# Patient Record
Sex: Female | Born: 1985 | Race: White | Hispanic: No | Marital: Married | Smoking: Former smoker
Health system: Southern US, Community
[De-identification: ages and names within clinical notes are randomized; demographics above are authoritative.]

## PROBLEM LIST (undated history)

## (undated) DIAGNOSIS — F32A Depression, unspecified: Secondary | ICD-10-CM

## (undated) DIAGNOSIS — F419 Anxiety disorder, unspecified: Secondary | ICD-10-CM

## (undated) DIAGNOSIS — U071 COVID-19: Secondary | ICD-10-CM

## (undated) DIAGNOSIS — T7840XA Allergy, unspecified, initial encounter: Secondary | ICD-10-CM

## (undated) DIAGNOSIS — Z8719 Personal history of other diseases of the digestive system: Secondary | ICD-10-CM

## (undated) DIAGNOSIS — K219 Gastro-esophageal reflux disease without esophagitis: Secondary | ICD-10-CM

## (undated) DIAGNOSIS — D509 Iron deficiency anemia, unspecified: Secondary | ICD-10-CM

## (undated) DIAGNOSIS — F329 Major depressive disorder, single episode, unspecified: Secondary | ICD-10-CM

## (undated) HISTORY — PX: GASTRIC BYPASS: SHX52

## (undated) HISTORY — DX: Major depressive disorder, single episode, unspecified: F32.9

## (undated) HISTORY — DX: Depression, unspecified: F32.A

## (undated) HISTORY — DX: Anxiety disorder, unspecified: F41.9

## (undated) HISTORY — DX: COVID-19: U07.1

---
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
## 2009-11-13 ENCOUNTER — Inpatient Hospital Stay: Payer: Self-pay | Admitting: Surgery

## 2009-11-17 LAB — PATHOLOGY REPORT

## 2010-11-16 HISTORY — PX: CHOLECYSTECTOMY: SHX55

## 2012-02-01 DIAGNOSIS — N978 Female infertility of other origin: Secondary | ICD-10-CM | POA: Insufficient documentation

## 2012-02-01 DIAGNOSIS — Z3181 Encounter for male factor infertility in female patient: Secondary | ICD-10-CM | POA: Insufficient documentation

## 2013-10-28 ENCOUNTER — Ambulatory Visit: Payer: Self-pay | Admitting: Specialist

## 2013-11-06 ENCOUNTER — Ambulatory Visit: Payer: Self-pay | Admitting: Specialist

## 2014-01-02 ENCOUNTER — Ambulatory Visit: Payer: Self-pay | Admitting: Specialist

## 2014-01-14 ENCOUNTER — Inpatient Hospital Stay: Payer: Self-pay | Admitting: Specialist

## 2014-01-14 LAB — CK-MB
CK-MB: 1.7 ng/mL (ref 0.5–3.6)
CK-MB: 1.7 ng/mL (ref 0.5–3.6)

## 2014-01-14 LAB — TROPONIN I: Troponin-I: <0.02 ng/mL

## 2014-01-15 LAB — BASIC METABOLIC PANEL
ANION GAP: 8 (ref 7–16)
BUN: 4 mg/dL — ABNORMAL LOW (ref 7–18)
CHLORIDE: 111 mmol/L — AB (ref 98–107)
CO2: 22 mmol/L (ref 21–32)
Calcium, Total: 7.9 mg/dL — ABNORMAL LOW (ref 8.5–10.1)
Creatinine: 0.64 mg/dL (ref 0.60–1.30)
EGFR (African American): >60
EGFR (Non-African Amer.): >60
GLUCOSE: 77 mg/dL (ref 65–99)
Osmolality: 277 (ref 275–301)
Potassium: 4.4 mmol/L (ref 3.5–5.1)
Sodium: 141 mmol/L (ref 136–145)

## 2014-01-15 LAB — CBC WITH DIFFERENTIAL/PLATELET
Basophil #: 0 10*3/uL (ref 0.0–0.1)
Basophil %: 0.3 %
EOS ABS: 0 10*3/uL (ref 0.0–0.7)
Eosinophil %: 0.1 %
HCT: 39.7 % (ref 35.0–47.0)
HGB: 13.4 g/dL (ref 12.0–16.0)
LYMPHS PCT: 15.6 %
Lymphocyte #: 1.6 10*3/uL (ref 1.0–3.6)
MCH: 28.5 pg (ref 26.0–34.0)
MCHC: 33.7 g/dL (ref 32.0–36.0)
MCV: 85 fL (ref 80–100)
Monocyte #: 0.8 x10 3/mm (ref 0.2–0.9)
Monocyte %: 7.6 %
Neutrophil #: 7.9 10*3/uL — ABNORMAL HIGH (ref 1.4–6.5)
Neutrophil %: 76.4 %
PLATELETS: 254 10*3/uL (ref 150–440)
RBC: 4.7 10*6/uL (ref 3.80–5.20)
RDW: 14 % (ref 11.5–14.5)
WBC: 10.3 10*3/uL (ref 3.6–11.0)

## 2014-01-15 LAB — MAGNESIUM: MAGNESIUM: 1.7 mg/dL — AB

## 2014-01-15 LAB — ALBUMIN: Albumin: 3.1 g/dL — ABNORMAL LOW (ref 3.4–5.0)

## 2014-01-15 LAB — PHOSPHORUS: PHOSPHORUS: 2.9 mg/dL (ref 2.5–4.9)

## 2014-08-09 NOTE — Consult Note (Signed)
Brief Consult Note: Diagnosis: 29 yr old female with chest pain , s/p gastric bypass.   Recommend further assessment or treatment.   Orders entered.   Comments: 29 yr old female  s/p gastric bypass surgery developed chest pain  relieved with belching. 1.Atypical chest pain;likley related to dyspepsia;check troponin,ekg to r/o ACS EKG  reviwed and no  ST-T changes.wil folow troponin,make further rec. troponins are negative,no further work  up is needed.  Electronic Signatures: Katha HammingKonidena, Lesha Jager (MD)  (Signed 29-Sep-15 14:55)  Authored: Brief Consult Note   Last Updated: 29-Sep-15 14:55 by Katha HammingKonidena, Robley Matassa (MD)

## 2014-08-09 NOTE — Op Note (Signed)
PATIENT NAME:  Mary Fox, Mary Fox MR#:  [id number] DATE OF BIRTH:  Aug 21, 1985  DATE OF PROCEDURE:  01/14/2014  PREOPERATIVE DIAGNOSIS: Morbid obesity.   POSTOPERATIVE DIAGNOSIS: Morbid obesity.   PROCEDURE: Laparoscopic Roux-en-Y gastric bypass.   SURGEON:  Primus Bravo, MD  ASSISTANT:  Mariella Saa, PA-C.  COMPLICATIONS:  None.   ANESTHESIA:  General endotracheal.   CLINICAL HISTORY:  See history and physical examination.  DETAILS OF THE PROCEDURE:  The patient was taken to the operating room and placed on the operating table in the supine position.  Appropriate monitors and supplemental oxygen were delivered.  Broad-spectrum IV antibiotics were administered.  Sequential stockings were placed.  The abdomen was prepped and draped in the usual sterile fashion after the smooth induction of general anesthesia.  The abdomen was accessed using a 5-ml optical trocar in the left upper quadrant.  Pneumoperitoneum was established without difficulty.  Multiple other ports were placed in preparation for gastric bypass.  The small bowel was identified at the ligament of Treitz and run for 50 cm distal to that point.  It was then divided using an Echelon white load stapler reinforced with seam guard.  The Harmonic scalpel was used to take down the mesentery to its base.  A (Dictation Anomaly)125 cm Roux limb was then created and a side-to-side jejunojejunostomy was created in the following fashion.  A tacking stitch was used using 2-0 Surgidac suture.  An enterotomy was made in both limbs and the Echelon white load stapler was inserted to its hub, clamped, and fired for its full 6-cm length.  The ensuing defect was closed using interrupted sutures to retract the enterotomy up and stapled across with the Echelon blue-load stapler with good effect.  A baby's butt stitch was placed times one using 2-0 Surgidac suture.  A stay suture was placed anteriorly using a 2-0 Surgidac suture and the mesenteric defect was closed using  running 2-0 Surgidac suture.  The omentum was divided then in the midline to create a path for the Roux limb to be lifted and elevated up to the gastric pouch.  The liver retractor was placed and the liver was retracted anteriorly and cephalad.  The patient was placed in steep reverse Trendelenburg after the Roux limb was tacked to the underside of the stomach using a 2-0 Surgidac suture.  All structures were removed from the stomach.  The pars flaccida was taken down using a Harmonic scalpel to the second gastric vein.  At that point the Echelon gold load reinforced with seam guard was used to fire multiple times creating a small 30 to 45-ml pouch.  Hemostasis was excellent.  The posterior pouch was then freed of all fibrofatty tissue in preparation for anastomosis.  The small bowel Roux limb was then mobilized up into the upper abdomen and tacked to the left side of the pouch with an interrupted 2-0 Surgidac suture.  Enterotomies were made in the gastric pouch and in the small bowel and the Echelon blue load stapler was inserted to 28 mm and clamped.  This was then fired and the ensuing defect was closed using a layer of 2-0 Polysorb suture times one.  A 34 French blunt-tipped bougie was inserted trans-orally down past the anastomosis and the second layer of closure occurred with two running 2-0 Polysorb sutures from either side, creating a two-layer anastomosis.  The bougie was removed and the proximal Roux limb was clamped.  This was submerged underneath saline and endoscope was placed trans-orally and guided  all the way down to the anastomosis without difficulty.  The anastomosis was widely patent and hemostasis was excellent.  No leak was present under high-pressure insufflation with the proximal Roux limb clamp.  The endoscopes were removed and I re-gowned and re-gloved.  We took the omentum and draped that over the top of the anastomosis and sutured it in place using a 2-0 Surgidac suture.  The Peterson's  defect was then closed using running 2-0 Surgidac suture. A drain was placed in the left upper quadrant over the anastomosis of 19 [place] Blake.  The liver retractor and trocars were removed without incident.  No bleeding occurred from any of these sites.  The wounds were then closed using 4-0 Vicryl and Dermabond.  The patient tolerated the procedure well, arrived in the recovery room in stable condition, and will be admitted to my care.   ____________________________ Primus BravoJon Tangi Shroff, MD jb:lt D: 01/14/2014 09:24:41 ET T: 01/14/2014 09:57:33 ET JOB#: [id number]  cc: Primus BravoJon Glendi Mohiuddin, MD, <Dictator> Geoffry ParadiseJON M Marvene Strohm MD ELECTRONICALLY SIGNED 01/14/2014 14:19

## 2014-08-09 NOTE — Consult Note (Signed)
PATIENT NAME:  Mary Fox, Claudett MR#:  [id number] DATE OF BIRTH:  1985-09-03  DATE OF CONSULTATION:  01/14/2014  CONSULT REQUESTING PHYSICIAN: Primus BravoJon Bruce, MD, bariatric surgery. CONSULTING PHYSICIAN:  Katha HammingSnehalatha Marwin Primmer, MD  REASON FOR CONSULTATION:  Chest pain. This is an urgent consult for chest pain evaluation.  HISTORY OF PRESENT ILLNESS:  The patient is a 29 year old morbidly obese female, had bariatric surgery today and patient had a gastric bypass surgery and the patient developed chest pain in the PACU and continued to have chest pain when she came into the room. The patient said it was midsternal chest pain and was not radiating to the shoulder or neck, but it was going to the back. No associated nausea or vomiting. The patient says that belching helped her and she felt better after belching.  Dr. Smitty CordsBruce called us to evaluate for chest pain evaluation. The patient has no past medical history of hypertension or diabetes.   PATIENT'S HOME MEDICATIONS:  Duloxetine 60 mg p.o. daily, clonidine 0.5 mg p.o. daily, magnesium citrate 100 mg 1 tablet daily, multivitamin 1 tablet daily, vitamin D3 one tablet daily, biotin 1 tablet daily.   SOCIAL HISTORY: No smoking, no drinking, no drugs.   PAST SURGICAL HISTORY: The patient had no history of operation except for gallbladder operation.   FAMILY HISTORY: Mother has history of atrial fibrillation and ablations before.   CURRENT MEDICATIONS:  She is on Dilaudid PCA and also Phenergan and Lovenox.   REVIEW OF SYSTEMS:   CONSTITUTIONAL: The patient denies any fever or fatigue.  EYES: No blurred vision.  ENT: No tinnitus. No epistaxis. No difficulty swallowing.  RESPIRATORY: No cough. No wheezing.  CARDIOVASCULAR: She has midsternal chest pain that radiates into the back. No orthopnea, or PND, no palpitations.   GASTROINTESTINAL: Has a lot of belching, denies any nausea or vomiting.  GENITOURINARY: No dysuria.  ENDOCRINE: No polyuria or nocturia.   HEMATOLOGIC: No anemia.  INTEGUMENTARY: No skin rash.  MUSCULOSKELETAL: No joint pain.  NEUROLOGIC: No numbness or weakness.  PSYCHIATRIC: Does have history of depression.   PHYSICAL EXAMINATION: VITAL SIGNS: Temperature 99.9, heart rate 80, blood pressure 126/86, saturation 99% on room air.  GENERAL: She is alert, awake, oriented, well-developed, well-nourished female seen in the room. She just had surgery and she is on Dilaudid PCA.  HEAD: Normocephalic, atraumatic.  EYES: Pupils equal, reacting to light. No conjunctival pallor, no icterus.  NOSE: No abnormalities. No drainage. MOUTH: No lesions. No exudates.  NECK: Supple. No JVD, no carotid bruit.  RESPIRATORY: Good respiratory effort. Clear to auscultation. No wheeze.  CARDIOVASCULAR: The patient has no CVA tenderness. Regular rate and rhythm. No gallops. The patient has no peripheral edema. Femoral pulses and pedal pulses are intact.  GASTROINTESTINAL: Abdomen is soft. The patient had gastric bypass surgery today.  MUSCULOSKELETAL: Normal gait and station. The patient able to move extremities x 4.  SKIN: Well hydrated.  NEUROLOGIC: Cranial nerves II through XII intact. Power 5/5 upper and lower extremities. DTR 2+ bilaterally.  PSYCHIATRIC: Mood and affect are within normal limits.   The patient's EKG is reviewed.  The patient did not have any ST-T changes.  It is normal sinus rhythm, 80 beats per minute.  LABORATORY DATA:  The patient's troponins have been negative at less than 0.02, CPK-MB is 1.7.   ASSESSMENT AND PLAN: The patient is a 29 year old female with atypical chest pain, likely secondary to dyspepsia rather than any coronary event. She really got better after belching so it  might be consistent with dyspepsia and gastroesophageal reflux disease rather than acute coronary syndrome.  No further workup is necessary. We will continue famotidine and can get Mylanta from tomorrow. The patient's condition discussed with mother  and at this time we will not order further workup for her chest pain.   TIME SPENT: About 55 minutes.   Thanks for asking [place] to see this patient.    ____________________________ Katha Hamming, MD sk:LT D: 01/14/2014 14:53:33 ET T: 01/14/2014 17:45:26 ET JOB#: [id number]  cc: Katha Hamming, MD, <Dictator> Katha Hamming MD ELECTRONICALLY SIGNED 02/08/2014 18:34

## 2014-10-24 ENCOUNTER — Telehealth: Payer: Self-pay | Admitting: Family Medicine

## 2014-10-24 NOTE — Telephone Encounter (Signed)
I don't see where this has ever been prescribed to her in the past from this office. For now I will not refill.  Dr. Juanetta GoslingHawkins can review it on Monday.

## 2014-10-24 NOTE — Telephone Encounter (Signed)
[name] called wondering if a refill for a medication patch can be sent to her pharmacy. She said [name] usually prescribes the patch for her before she goes out on the boat. She said she's not been here in a few months. She's wondering if she can get this refill today as she's going on the boat this weekend. Please call the pt. Pt ph# [phone] Thank you.

## 2014-10-24 NOTE — Telephone Encounter (Signed)
Please review this. I am not able to decide if this can be sent.

## 2014-10-27 NOTE — Telephone Encounter (Signed)
LMTCB I do not think she is having any issues and have not heard from her.[facility]

## 2014-10-27 NOTE — Telephone Encounter (Signed)
Let me know if she calls this AM. Sshe may not need it since she was supposed to be on boat this past weekend.-jh

## 2014-11-18 ENCOUNTER — Encounter: Payer: Self-pay | Admitting: Emergency Medicine

## 2014-11-18 ENCOUNTER — Inpatient Hospital Stay
Admission: EM | Admit: 2014-11-18 | Discharge: 2014-11-19 | DRG: 641 | Disposition: A | Discharge status: Home / Self Care | Payer: 59 | Attending: Internal Medicine | Admitting: Internal Medicine

## 2014-11-18 DIAGNOSIS — R1010 Upper abdominal pain, unspecified: Secondary | ICD-10-CM

## 2014-11-18 DIAGNOSIS — Z79899 Other long term (current) drug therapy: Secondary | ICD-10-CM

## 2014-11-18 DIAGNOSIS — E871 Hypo-osmolality and hyponatremia: Secondary | ICD-10-CM | POA: Diagnosis not present

## 2014-11-18 DIAGNOSIS — Z9884 Bariatric surgery status: Secondary | ICD-10-CM

## 2014-11-18 DIAGNOSIS — R1012 Left upper quadrant pain: Secondary | ICD-10-CM | POA: Diagnosis present

## 2014-11-18 DIAGNOSIS — R1013 Epigastric pain: Secondary | ICD-10-CM | POA: Diagnosis present

## 2014-11-18 DIAGNOSIS — Z9049 Acquired absence of other specified parts of digestive tract: Secondary | ICD-10-CM | POA: Diagnosis present

## 2014-11-18 LAB — COMPREHENSIVE METABOLIC PANEL
ALBUMIN: 2.8 g/dL — AB (ref 3.5–5.0)
ALT: 153 U/L — AB (ref 14–54)
AST: 101 U/L — ABNORMAL HIGH (ref 15–41)
Alkaline Phosphatase: 99 U/L (ref 38–126)
Anion gap: 11 (ref 5–15)
BUN: 17 mg/dL (ref 6–20)
CO2: 24 mmol/L (ref 22–32)
Calcium: 7.7 mg/dL — ABNORMAL LOW (ref 8.9–10.3)
Chloride: 88 mmol/L — ABNORMAL LOW (ref 101–111)
Creatinine, Ser: 0.88 mg/dL (ref 0.44–1.00)
GFR calc Af Amer: >60 mL/min (ref >60)
GFR calc non Af Amer: >60 mL/min (ref >60)
Glucose, Bld: 117 mg/dL — ABNORMAL HIGH (ref 65–99)
Potassium: 3.7 mmol/L (ref 3.5–5.1)
Sodium: 123 mmol/L — ABNORMAL LOW (ref 135–145)
Total Bilirubin: 1.9 mg/dL — ABNORMAL HIGH (ref 0.3–1.2)
Total Protein: 5.9 g/dL — ABNORMAL LOW (ref 6.5–8.1)

## 2014-11-18 LAB — URINALYSIS COMPLETE WITH MICROSCOPIC (ARMC ONLY)
Bacteria, UA: NONE SEEN
Bilirubin Urine: NEGATIVE
GLUCOSE, UA: NEGATIVE mg/dL
HGB URINE DIPSTICK: NEGATIVE
Ketones, ur: NEGATIVE mg/dL
Leukocytes, UA: NEGATIVE
Nitrite: NEGATIVE
PH: 6 (ref 5.0–8.0)
PROTEIN: NEGATIVE mg/dL
SPECIFIC GRAVITY, URINE: 1.002 — AB (ref 1.005–1.030)

## 2014-11-18 LAB — CBC WITH DIFFERENTIAL/PLATELET
BASOS PCT: 0 %
Basophils Absolute: 0 10*3/uL (ref 0–0.1)
EOS PCT: 1 %
Eosinophils Absolute: 0.1 10*3/uL (ref 0–0.7)
HCT: 39.5 % (ref 35.0–47.0)
Hemoglobin: 13.2 g/dL (ref 12.0–16.0)
Lymphocytes Relative: 36 %
Lymphs Abs: 3 10*3/uL (ref 1.0–3.6)
MCH: 28.2 pg (ref 26.0–34.0)
MCHC: 33.5 g/dL (ref 32.0–36.0)
MCV: 84 fL (ref 80.0–100.0)
MONO ABS: 0.5 10*3/uL (ref 0.2–0.9)
Monocytes Relative: 6 %
Neutro Abs: 4.7 10*3/uL (ref 1.4–6.5)
Neutrophils Relative %: 57 %
PLATELETS: 227 10*3/uL (ref 150–440)
RBC: 4.7 MIL/uL (ref 3.80–5.20)
RDW: 12.9 % (ref 11.5–14.5)
WBC: 8.3 10*3/uL (ref 3.6–11.0)

## 2014-11-18 LAB — LIPASE, BLOOD: LIPASE: 13 U/L — AB (ref 22–51)

## 2014-11-18 LAB — POCT PREGNANCY, URINE: Preg Test, Ur: NEGATIVE

## 2014-11-18 MED ORDER — SODIUM CHLORIDE 0.9 % IV BOLUS (SEPSIS)
500.0000 mL | Freq: Once | INTRAVENOUS | Status: AC
  Administered 2014-11-18: 500 mL via INTRAVENOUS

## 2014-11-18 MED ORDER — IOHEXOL 240 MG/ML SOLN
25.0000 mL | Freq: Once | INTRAMUSCULAR | Status: AC | PRN
  Administered 2014-11-18: 25 mL via ORAL

## 2014-11-18 NOTE — ED Notes (Signed)
Pt arrived to the ED for complaints of abdominal pain in the LUQ radiating to the RUQ. Pt states that this pain comes on often and it has happened for the las couple of weeks. Pt reports that the pain starts after eating and denies any other accompanied symptoms. Pt is concern because she had a gastric bypass surgery. Pt is AOx4 in no apparent distress.

## 2014-11-18 NOTE — ED Provider Notes (Signed)
[facility] Emergency Department Provider Note  ____________________________________________  Time seen: Approximately 10:15 PM  I have reviewed the triage vital signs and the nursing notes.   HISTORY  Chief Complaint Abdominal Pain    HPI Mary Fox is a 29 y.o. female with a history of gastric bypass in September 2015 who presents today with left upper quadrant and right upper quadrant abdominal pain which has been intermittent today. The pain is sharp and worsened by food. At this point the patient is only having mild pain and is not requesting any pain medications. She is not had any nausea, vomiting or diarrhea. She is also had her gallbladder removed 4 years ago. Denies any burning with urination or frequency.she says that she has had these episodes of pain over the past year, however they have been worsening over the past month.   History reviewed. No pertinent past medical history.  There are no active problems to display for this patient.   Past Surgical History  Procedure Laterality Date  . Gastric bypass    . Cholecystectomy      Current Outpatient Rx  Name  Route  Sig  Dispense  Refill  . DULoxetine (CYMBALTA) 60 MG capsule   Oral   Take 60 mg by mouth daily.         . Multiple Vitamin (MULTIVITAMIN) capsule   Oral   Take 1 capsule by mouth daily.           Allergies Review of patient's allergies indicates no known allergies.  History reviewed. No pertinent family history.  Social History History  Substance Use Topics  . Smoking status: Never Smoker   . Smokeless tobacco: Not on file  . Alcohol Use: Yes    Review of Systems Constitutional: No fever/chills Eyes: No visual changes. ENT: No sore throat. Cardiovascular: Denies chest pain. Respiratory: Denies shortness of breath. Gastrointestinal:  No nausea, no vomiting.  No diarrhea.  No constipation. Genitourinary: Negative for dysuria. Musculoskeletal: Negative for  back pain. Skin: Negative for rash. Neurological: Negative for headaches, focal weakness or numbness.  10-point ROS otherwise negative.  ____________________________________________   PHYSICAL EXAM:  VITAL SIGNS: ED Triage Vitals  Enc Vitals Group     BP 11/18/14 1931 129/76 mmHg     Pulse Rate 11/18/14 1931 58     Resp 11/18/14 1931 16     Temp 11/18/14 1931 98.3 F (36.8 C)     Temp Source 11/18/14 1931 Oral     SpO2 11/18/14 1931 100 %     Weight 11/18/14 1931 201 lb (91.173 kg)     Height 11/18/14 1931  (1.727 m)     Head Cir --      Peak Flow --      Pain Score 11/18/14 1932 5     Pain Loc --      Pain Edu? --      Excl. in GC? --     Constitutional: Alert and oriented. Well appearing and in no acute distress. Eyes: Conjunctivae are normal. PERRL. EOMI. Head: Atraumatic. Nose: No congestion/rhinnorhea. Mouth/Throat: Mucous membranes are moist.  Oropharynx non-erythematous. Neck: No stridor.   Cardiovascular: Normal rate, regular rhythm. Grossly normal heart sounds.  Good peripheral circulation. Respiratory: Normal respiratory effort.  No retractions. Lungs CTAB. Gastrointestinal: Softwith mild tenderness to the epigastrium. No distention. No abdominal bruits. No CVA tenderness. Musculoskeletal: No lower extremity tenderness nor edema.  No joint effusions. Neurologic:  Normal speech and language. No gross  focal neurologic deficits are appreciated. No gait instability. Skin:  Skin is warm, dry and intact. No rash noted. Psychiatric: Mood and affect are normal. Speech and behavior are normal.  ____________________________________________   LABS (all labs ordered are listed, but only abnormal results are displayed)  Labs Reviewed  COMPREHENSIVE METABOLIC PANEL - Abnormal; Notable for the following:    Sodium 123 (*)    Chloride 88 (*)    Glucose, Bld 117 (*)    Calcium 7.7 (*)    Total Protein 5.9 (*)    Albumin 2.8 (*)    AST 101 (*)    ALT 153 (*)     Total Bilirubin 1.9 (*)    All other components within normal limits  LIPASE, BLOOD - Abnormal; Notable for the following:    Lipase 13 (*)    All other components within normal limits  CBC WITH DIFFERENTIAL/PLATELET  URINALYSIS COMPLETEWITH MICROSCOPIC ([facility] ONLY)  POC URINE PREG, ED   ____________________________________________  EKG   ____________________________________________  RADIOLOGY  Pending CAT scan of the abdomen and pelvis. ____________________________________________   PROCEDURES   ____________________________________________   INITIAL IMPRESSION / ASSESSMENT AND PLAN / ED COURSE  Pertinent labs & imaging results that were available during my care of the patient were reviewed by me and considered in my medical decision making (see chart for details).  ----------------------------------------- 11:09 PM on 11/18/2014 -----------------------------------------  Signed out to Dr. Zenda Alpers. ____________________________________________   FINAL CLINICAL IMPRESSION(S) / ED DIAGNOSES  Upper abdominal pain. Hyponatremia. Initial visit.     Myrna Blazer, MD 11/18/14 [phone]

## 2014-11-18 NOTE — ED Notes (Signed)
Pt to desk wanting to know how much longer the wait will be. Pt states her abd pain is worsening. MD notified and labs reviewed. Pt encouraged to stay and wait for room due to abnormal labs. Charge nurse made aware. Pt verbalized understanding.

## 2014-11-19 ENCOUNTER — Emergency Department: Payer: 59

## 2014-11-19 DIAGNOSIS — Z9049 Acquired absence of other specified parts of digestive tract: Secondary | ICD-10-CM | POA: Diagnosis present

## 2014-11-19 DIAGNOSIS — E871 Hypo-osmolality and hyponatremia: Secondary | ICD-10-CM | POA: Diagnosis present

## 2014-11-19 DIAGNOSIS — R1013 Epigastric pain: Secondary | ICD-10-CM | POA: Diagnosis present

## 2014-11-19 DIAGNOSIS — Z79899 Other long term (current) drug therapy: Secondary | ICD-10-CM | POA: Diagnosis not present

## 2014-11-19 DIAGNOSIS — R1012 Left upper quadrant pain: Secondary | ICD-10-CM | POA: Diagnosis present

## 2014-11-19 DIAGNOSIS — Z9884 Bariatric surgery status: Secondary | ICD-10-CM | POA: Diagnosis not present

## 2014-11-19 LAB — CBC
HCT: 35.2 % (ref 35.0–47.0)
Hemoglobin: 11.7 g/dL — ABNORMAL LOW (ref 12.0–16.0)
MCH: 27.9 pg (ref 26.0–34.0)
MCHC: 33.3 g/dL (ref 32.0–36.0)
MCV: 83.8 fL (ref 80.0–100.0)
PLATELETS: 194 10*3/uL (ref 150–440)
RBC: 4.2 MIL/uL (ref 3.80–5.20)
RDW: 13 % (ref 11.5–14.5)
WBC: 6.5 10*3/uL (ref 3.6–11.0)

## 2014-11-19 LAB — COMPREHENSIVE METABOLIC PANEL
ALBUMIN: 3.3 g/dL — AB (ref 3.5–5.0)
ALT: 15 U/L (ref 14–54)
ANION GAP: 7 (ref 5–15)
AST: 15 U/L (ref 15–41)
Alkaline Phosphatase: 68 U/L (ref 38–126)
BILIRUBIN TOTAL: 0.4 mg/dL (ref 0.3–1.2)
BUN: 7 mg/dL (ref 6–20)
CO2: 24 mmol/L (ref 22–32)
Calcium: 8.5 mg/dL — ABNORMAL LOW (ref 8.9–10.3)
Chloride: 109 mmol/L (ref 101–111)
Creatinine, Ser: 0.53 mg/dL (ref 0.44–1.00)
GFR calc Af Amer: >60 mL/min (ref >60)
GFR calc non Af Amer: >60 mL/min (ref >60)
GLUCOSE: 74 mg/dL (ref 65–99)
POTASSIUM: 3.6 mmol/L (ref 3.5–5.1)
Sodium: 140 mmol/L (ref 135–145)
Total Protein: 5.8 g/dL — ABNORMAL LOW (ref 6.5–8.1)

## 2014-11-19 LAB — TSH: TSH: 3.965 u[IU]/mL (ref 0.350–4.500)

## 2014-11-19 MED ORDER — ALUM & MAG HYDROXIDE-SIMETH 200-200-20 MG/5ML PO SUSP: 30.0000 mL | Freq: Four times a day (QID) | ORAL | Status: DC | PRN

## 2014-11-19 MED ORDER — IOHEXOL 300 MG/ML  SOLN
100.0000 mL | Freq: Once | INTRAMUSCULAR | Status: AC | PRN
  Administered 2014-11-19: 100 mL via INTRAVENOUS

## 2014-11-19 MED ORDER — ACETAMINOPHEN 650 MG RE SUPP: 650.0000 mg | Freq: Four times a day (QID) | RECTAL | Status: DC | PRN

## 2014-11-19 MED ORDER — ADULT MULTIVITAMIN W/MINERALS CH: 1.0000 | ORAL_TABLET | Freq: Every day | ORAL | Status: DC

## 2014-11-19 MED ORDER — PANTOPRAZOLE SODIUM 40 MG PO TBEC
40.0000 mg | DELAYED_RELEASE_TABLET | Freq: Two times a day (BID) | ORAL | Status: DC
  Administered 2014-11-19: 20 mg via ORAL

## 2014-11-19 MED ORDER — HEPARIN SODIUM (PORCINE) 5000 UNIT/ML IJ SOLN
5000.0000 [IU] | Freq: Three times a day (TID) | INTRAMUSCULAR | Status: DC
  Administered 2014-11-19: 5000 [IU] via SUBCUTANEOUS
  Filled 2014-11-19: qty 1

## 2014-11-19 MED ORDER — SODIUM CHLORIDE 0.9 % IV SOLN
INTRAVENOUS | Status: DC
Start: 2014-11-19 — End: 2014-11-19
  Administered 2014-11-19: 03:00:00 via INTRAVENOUS

## 2014-11-19 MED ORDER — PANTOPRAZOLE SODIUM 40 MG PO TBEC
DELAYED_RELEASE_TABLET | ORAL | Status: AC
  Administered 2014-11-19: 20 mg via ORAL
  Filled 2014-11-19: qty 1

## 2014-11-19 MED ORDER — ONDANSETRON HCL 4 MG/2ML IJ SOLN: 4.0000 mg | Freq: Four times a day (QID) | INTRAMUSCULAR | Status: DC | PRN

## 2014-11-19 MED ORDER — ACETAMINOPHEN 325 MG PO TABS: 650.0000 mg | ORAL_TABLET | Freq: Four times a day (QID) | ORAL | Status: DC | PRN

## 2014-11-19 MED ORDER — ONDANSETRON HCL 4 MG PO TABS: 4.0000 mg | ORAL_TABLET | Freq: Four times a day (QID) | ORAL | Status: DC | PRN

## 2014-11-19 MED ORDER — MORPHINE SULFATE 2 MG/ML IJ SOLN: 2.0000 mg | INTRAMUSCULAR | Status: DC | PRN

## 2014-11-19 MED ORDER — PANTOPRAZOLE SODIUM 40 MG PO TBEC: 40.0000 mg | DELAYED_RELEASE_TABLET | Freq: Two times a day (BID) | ORAL | Status: DC

## 2014-11-19 MED ORDER — OXYCODONE HCL 5 MG PO TABS
5.0000 mg | ORAL_TABLET | ORAL | Status: DC | PRN
  Administered 2014-11-19: 5 mg via ORAL
  Filled 2014-11-19: qty 1

## 2014-11-19 MED ORDER — DULOXETINE HCL 30 MG PO CPEP: 60.0000 mg | ORAL_CAPSULE | Freq: Every day | ORAL | Status: DC

## 2014-11-19 NOTE — Discharge Instructions (Signed)

## 2014-11-19 NOTE — Progress Notes (Signed)
Pt discharged home, NA better at 140. Cymbalta was causing hypoateremia, pt will follow up with PCP regarding this side effect.

## 2014-11-19 NOTE — ED Notes (Signed)
Patient transported to CT 

## 2014-11-19 NOTE — Discharge Summary (Signed)
[facility] - [place] at [facility]  DISCHARGE SUMMARY   PATIENT NAME: Mary Fox    MR#:  [id number]  DATE OF BIRTH:  [birth year]  DATE OF ADMISSION:  11/18/2014 ADMITTING PHYSICIAN: Wyatt Haste, MD  DATE OF DISCHARGE: 11/19/2014  PRIMARY CARE PHYSICIAN: Fidel Levy, MD    ADMISSION DIAGNOSIS:  Hyponatremia [E87.1] Pain of upper abdomen [R10.10]  DISCHARGE DIAGNOSIS:  Active Problems:   Hyponatremia   SECONDARY DIAGNOSIS:  History reviewed. No pertinent past medical history.  HOSPITAL COURSE:   #1 abdominal pain: This was the patient's initial complaint and has now resolved without intervention. CT of the abdomen and pelvis was normal. 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 She has been advised to follow up with her. Aeronautical engineer for possibility of complications at the anastomotic site. She has a new prescription for Protonix.  #2 hyponatremia: Her initial serum sodium was 123. This was thought to be due to decreased by mouth intake as well as SSRI therapy. She has been advised to hold her SSRI until she can see her primary care physician. Sodium now normal at 140 after normal saline.  DISCHARGE CONDITIONS:   Stable  CONSULTS OBTAINED:    None  DRUG ALLERGIES:  No Known Allergies  DISCHARGE MEDICATIONS:   Current Discharge Medication List    CONTINUE these medications which have NOT CHANGED   Details  Multiple Vitamin (MULTIVITAMIN) capsule Take 1 capsule by mouth daily.      STOP taking these medications     DULoxetine (CYMBALTA) 60 MG capsule          DISCHARGE INSTRUCTIONS:     DIET:  Regular diet  DISCHARGE CONDITION:  Stable  ACTIVITY:  Activity as tolerated  OXYGEN:  Home Oxygen: No.   Oxygen Delivery: room air  DISCHARGE LOCATION:  home   If you experience worsening of your admission symptoms, develop shortness of breath, life threatening emergency, suicidal  or homicidal thoughts you must seek medical attention immediately by calling 911 or calling your MD immediately  if symptoms less severe.  You Must read complete instructions/literature along with all the possible adverse reactions/side effects for all the Medicines you take and that have been prescribed to you. Take any new Medicines after you have completely understood and accpet all the possible adverse reactions/side effects.   Please note  You were cared for by a hospitalist during your hospital stay. If you have any questions about your discharge medications or the care you received while you were in the hospital after you are discharged, you can call the unit and asked to speak with the hospitalist on call if the hospitalist that took care of you is not available. Once you are discharged, your primary care physician will handle any further medical issues. Please note that NO REFILLS for any discharge medications will be authorized once you are discharged, as it is imperative that you return to your primary care physician (or establish a relationship with a primary care physician if you do not have one) for your aftercare needs so that they can reassess your need for medications and monitor your lab values.   Today   CHIEF COMPLAINT:   Chief Complaint  Patient presents with  . Abdominal Pain    HISTORY OF PRESENT ILLNESS:  Mary Fox is a 29 y.o. female with a known history of gastric bypass, cholecystectomy presenting with intermittent abdominal pain. She describes a couple week duration  of abdominal pain intermittent left upper quadrant/epigastric in location cramping in quality 67/10 intensity worse after eating no relieving factors. She denies any associated symptoms including nausea, vomiting, diarrhea, constipation. Emergency department course: Workup unrevealing for etiology for abdominal pain however found to be hyponatremic at 123  VITAL SIGNS:  Blood pressure 100/58, pulse 66,  temperature 98.1 F (36.7 C), temperature source Oral, resp. rate 16, height  (1.727 m), weight 92.126 kg (203 lb 1.6 oz), last menstrual period 11/04/2014, SpO2 97 %.  I/O:   Intake/Output Summary (Last 24 hours) at 11/19/14 1249 Last data filed at 11/19/14 1610  Gross per 24 hour  Intake      0 ml  Output      0 ml  Net      0 ml    PHYSICAL EXAMINATION:  GENERAL:  29 y.o.-year-old patient lying in the bed with no acute distress.  EYES: Pupils equal, round, reactive to light and accommodation. No scleral icterus. Extraocular muscles intact.  HEENT: Head atraumatic, normocephalic. Oropharynx and nasopharynx clear.  NECK:  Supple, no jugular venous distention. No thyroid enlargement, no tenderness.  LUNGS: Normal breath sounds bilaterally, no wheezing, rales,rhonchi or crepitation. No use of accessory muscles of respiration.  CARDIOVASCULAR: S1, S2 normal. No murmurs, rubs, or gallops.  ABDOMEN: Soft, non-tender, non-distended. Bowel sounds present. No organomegaly or mass.  EXTREMITIES: No pedal edema, cyanosis, or clubbing.  NEUROLOGIC: Cranial nerves II through XII are intact. Muscle strength 5/5 in all extremities. Sensation intact. Gait not checked.  PSYCHIATRIC: The patient is alert and oriented x 3.  SKIN: No obvious rash, lesion, or ulcer.   DATA REVIEW:   CBC  Recent Labs Lab 11/19/14 0521  WBC 6.5  HGB 11.7*  HCT 35.2  PLT 194    Chemistries   Recent Labs Lab 11/19/14 0523  NA 140  K 3.6  CL 109  CO2 24  GLUCOSE 74  BUN 7  CREATININE 0.53  CALCIUM 8.5*  AST 15  ALT 15  ALKPHOS 68  BILITOT 0.4    Cardiac Enzymes No results for input(s): TROPONINI in the last 168 hours.  Microbiology Results  No results found for this or any previous visit.  RADIOLOGY:  Ct Abdomen Pelvis W Contrast  11/19/2014   CLINICAL DATA:  Acute onset of left upper quadrant abdominal pain, radiating to the right upper quadrant. Initial encounter.  EXAM: CT ABDOMEN AND  PELVIS WITH CONTRAST  TECHNIQUE: Multidetector CT imaging of the abdomen and pelvis was performed using the standard protocol following bolus administration of intravenous contrast.  CONTRAST:  OMNIPAQUE IOHEXOL 300 MG/ML  SOLN  COMPARISON:  Abdominal ultrasound performed 11/13/2009  FINDINGS: The visualized lung bases are clear. The patient is status post gastric bypass. The gastrojejunal anastomosis is unremarkable in appearance.  The liver and spleen are unremarkable in appearance. The patient is status post cholecystectomy with with clips at the gallbladder fossa. The pancreas and adrenal glands are unremarkable.  The kidneys are unremarkable in appearance. There is no evidence of hydronephrosis. No renal or ureteral stones are seen. No perinephric stranding is appreciated.  The small bowel is unremarkable in appearance. A bowel suture line at the left mid abdomen is unremarkable in appearance. The distal stomach contains a small amount of fluid and is grossly unremarkable in appearance. No acute vascular abnormalities are seen.  The appendix is normal in caliber, without evidence of appendicitis. The colon is unremarkable in appearance.  The bladder is mildly distended  and grossly unremarkable. The uterus is unremarkable in appearance, aside from a likely 3.7 cm exophytic fibroid. The ovaries are grossly symmetric. No suspicious adnexal masses are seen. Trace fluid within the pelvis is likely physiologic in nature. No inguinal lymphadenopathy is seen.  No acute osseous abnormalities are identified.  IMPRESSION: 1. No acute abnormality seen within the abdomen or pelvis. 2. Gastrojejunal anastomosis is unremarkable in appearance. 3. Likely 3.7 cm exophytic uterine fibroid noted.   Electronically Signed   By: Roanna Raider M.D.   On: 11/19/2014 00:26    EKG:   Orders placed or performed in visit on 01/14/14  . EKG 12-Lead      Management plans discussed with the patient, family and they are in  agreement.  CODE STATUS:     Code Status Orders        Start     Ordered   11/19/14 0223  Full code   Continuous     11/19/14 0223      TOTAL TIME TAKING CARE OF THIS PATIENT: 35 minutes.  Greater than 50% of time spent in care coordination and counseling.  Elby Showers M.D on 11/19/2014 at 12:49 PM  Between 7am to 6pm - Pager - [phone]  After 6pm go to [email or web address] - password [email or web address]  [place] [facility]  Office  [phone]  CC: Primary care physician; Fidel Levy, MD

## 2014-11-19 NOTE — H&P (Signed)
[facility] - [place] at [facility]   PATIENT NAME: Mary Fox    MR#:  [id number]  DATE OF BIRTH:  Aug 31, 1985   DATE OF ADMISSION:  11/18/2014  PRIMARY CARE PHYSICIAN: Fidel Levy, MD   REQUESTING/REFERRING PHYSICIAN: Zenda Alpers  CHIEF COMPLAINT:   Chief Complaint  Patient presents with  . Abdominal Pain    HISTORY OF PRESENT ILLNESS:  Mary Fox  is a 29 y.o. female with a known history of gastric bypass, cholecystectomy presenting with intermittent abdominal pain. She describes a couple week duration of abdominal pain intermittent left upper quadrant/epigastric in location cramping in quality 67/10 intensity worse after eating no relieving factors. She denies any associated symptoms including nausea, vomiting, diarrhea, constipation. Emergency department course: Workup unrevealing for etiology for abdominal pain however found to be hyponatremic at 123  PAST MEDICAL HISTORY:  History reviewed. No pertinent past medical history.  PAST SURGICAL HISTORY:   Past Surgical History  Procedure Laterality Date  . Gastric bypass    . Cholecystectomy      SOCIAL HISTORY:   History  Substance Use Topics  . Smoking status: Never Smoker   . Smokeless tobacco: Not on file  . Alcohol Use: Yes    FAMILY HISTORY:   Family History  Problem Relation Age of Onset  . Diabetes Neg Hx     DRUG ALLERGIES:  No Known Allergies  REVIEW OF SYSTEMS:  REVIEW OF SYSTEMS:  CONSTITUTIONAL: Denies fevers, chills, fatigue, weakness.  EYES: Denies blurred vision, double vision, or eye pain.  EARS, NOSE, THROAT: Denies tinnitus, ear pain, hearing loss.  RESPIRATORY: denies cough, shortness of breath, wheezing  CARDIOVASCULAR: Denies chest pain, palpitations, edema.  GASTROINTESTINAL: Denies nausea, vomiting, diarrhea, positive abdominal pain.  GENITOURINARY: Denies dysuria, hematuria.  ENDOCRINE: Denies nocturia or thyroid problems. HEMATOLOGIC AND LYMPHATIC:  Denies easy bruising or bleeding.  SKIN: Denies rash or lesions.  MUSCULOSKELETAL: Denies pain in neck, back, shoulder, knees, hips, or further arthritic symptoms.  NEUROLOGIC: Denies paralysis, paresthesias.  PSYCHIATRIC: Denies anxiety or depressive symptoms. Otherwise full review of systems performed by me is negative.   MEDICATIONS AT HOME:   Prior to Admission medications   Medication Sig Start Date End Date Taking? Authorizing Provider  DULoxetine (CYMBALTA) 60 MG capsule Take 60 mg by mouth daily.   Yes Historical Provider, MD  Multiple Vitamin (MULTIVITAMIN) capsule Take 1 capsule by mouth daily.   Yes Historical Provider, MD      VITAL SIGNS:  Blood pressure 122/86, pulse 60, temperature 98.3 F (36.8 C), temperature source Oral, resp. rate 18, height  (1.727 m), weight 201 lb (91.173 kg), last menstrual period 11/04/2014, SpO2 100 %.  PHYSICAL EXAMINATION:  VITAL SIGNS: Filed Vitals:   11/19/14 0123  BP: 122/86  Pulse: 60  Temp:   Resp: 18   GENERAL:29 y.o.female currently in no acute distress.  HEAD: Normocephalic, atraumatic.  EYES: Pupils equal, round, reactive to light. Extraocular muscles intact. No scleral icterus.  MOUTH: Moist mucosal membrane. Dentition intact. No abscess noted.  EAR, NOSE, THROAT: Clear without exudates. No external lesions.  NECK: Supple. No thyromegaly. No nodules. No JVD.  PULMONARY: Clear to ascultation, without wheeze rails or rhonci. No use of accessory muscles, Good respiratory effort. good air entry bilaterally CHEST: Nontender to palpation.  CARDIOVASCULAR: S1 and S2. Regular rate and rhythm. No murmurs, rubs, or gallops. No edema. Pedal pulses 2+ bilaterally.  GASTROINTESTINAL: Soft, nontender, nondistended. No masses. Positive bowel sounds. No hepatosplenomegaly.  MUSCULOSKELETAL: No swelling, clubbing, or edema. Range of motion full in all extremities.  NEUROLOGIC: Cranial nerves II through XII are intact. No gross focal  neurological deficits. Sensation intact. Reflexes intact.  SKIN: No ulceration, lesions, rashes, or cyanosis. Skin warm and dry. Turgor intact.  PSYCHIATRIC: Mood, affect within normal limits. The patient is awake, alert and oriented x 3. Insight, judgment intact.    LABORATORY PANEL:   CBC  Recent Labs Lab 11/18/14 1937  WBC 8.3  HGB 13.2  HCT 39.5  PLT 227   ------------------------------------------------------------------------------------------------------------------  Chemistries   Recent Labs Lab 11/18/14 1937  NA 123*  K 3.7  CL 88*  CO2 24  GLUCOSE 117*  BUN 17  CREATININE 0.88  CALCIUM 7.7*  AST 101*  ALT 153*  ALKPHOS 99  BILITOT 1.9*   ------------------------------------------------------------------------------------------------------------------  Cardiac Enzymes No results for input(s): TROPONINI in the last 168 hours. ------------------------------------------------------------------------------------------------------------------  RADIOLOGY:  Ct Abdomen Pelvis W Contrast  11/19/2014   CLINICAL DATA:  Acute onset of left upper quadrant abdominal pain, radiating to the right upper quadrant. Initial encounter.  EXAM: CT ABDOMEN AND PELVIS WITH CONTRAST  TECHNIQUE: Multidetector CT imaging of the abdomen and pelvis was performed using the standard protocol following bolus administration of intravenous contrast.  CONTRAST:  OMNIPAQUE IOHEXOL 300 MG/ML  SOLN  COMPARISON:  Abdominal ultrasound performed 11/13/2009  FINDINGS: The visualized lung bases are clear. The patient is status post gastric bypass. The gastrojejunal anastomosis is unremarkable in appearance.  The liver and spleen are unremarkable in appearance. The patient is status post cholecystectomy with with clips at the gallbladder fossa. The pancreas and adrenal glands are unremarkable.  The kidneys are unremarkable in appearance. There is no evidence of hydronephrosis. No renal or ureteral  stones are seen. No perinephric stranding is appreciated.  The small bowel is unremarkable in appearance. A bowel suture line at the left mid abdomen is unremarkable in appearance. The distal stomach contains a small amount of fluid and is grossly unremarkable in appearance. No acute vascular abnormalities are seen.  The appendix is normal in caliber, without evidence of appendicitis. The colon is unremarkable in appearance.  The bladder is mildly distended and grossly unremarkable. The uterus is unremarkable in appearance, aside from a likely 3.7 cm exophytic fibroid. The ovaries are grossly symmetric. No suspicious adnexal masses are seen. Trace fluid within the pelvis is likely physiologic in nature. No inguinal lymphadenopathy is seen.  No acute osseous abnormalities are identified.  IMPRESSION: 1. No acute abnormality seen within the abdomen or pelvis. 2. Gastrojejunal anastomosis is unremarkable in appearance. 3. Likely 3.7 cm exophytic uterine fibroid noted.   Electronically Signed   By: Roanna Raider M.D.   On: 11/19/2014 00:26    EKG:   Orders placed or performed in visit on 01/14/14  . EKG 12-Lead    IMPRESSION AND PLAN:   29 year old Caucasian female history gastric bypass surgery presenting with abdominal pain found to be hyponatremic  1. Hyponatremia: 455 mEq sodium deficit received 500 mL normal saline bolus and emergency department continue normal saline infusion 100 mL/h next 24 hours to correct to 133. We'll check urine sodium urine osmolality and TSH insertion for etiology 2. Dyspepsia: Initiate PPI therapy 3. Venous thrombus embolism prophylactic: Heparin subcutaneous   All the records are reviewed and case discussed with ED provider. Management plans discussed with the patient, family and they are in agreement.  CODE STATUS: Full  TOTAL TIME TAKING CARE OF THIS PATIENT: 35  minutes.    Hower,  Mardi Mainland.D on 11/19/2014 at 3:14 AM  Between 7am to 6pm - Pager -  [phone]  After 6pm: House Pager: - [phone]  [facility]  Office  [phone]  CC: Primary care physician; Fidel Levy, MD

## 2014-11-19 NOTE — ED Provider Notes (Signed)
-----------------------------------------   2:14 AM on 11/19/2014 -----------------------------------------   Blood pressure 122/86, pulse 60, temperature 98.3 F (36.8 C), temperature source Oral, resp. rate 18, height  (1.727 m), weight 201 lb (91.173 kg), last menstrual period 11/04/2014, SpO2 100 %.  Assuming care from Dr. Pershing Proud.  In short, Mary Fox is a 29 y.o. female with a chief complaint of Abdominal Pain .  Refer to the original H&P for additional details.  The current plan of care is to follow-up the results of the CT scan.   CT abdomen and pelvis: No acute abnormality seen within the abdomen or pelvis, gastrojejunal anastomosis is unremarkable in appearance, likely 3.7 cm exophytic uterine fibroid noted.  The patient does have some electrolyte abnormalities with a sodium of 123. Although her CT scan is unremarkable and her pain is likely being caused due to an ulcer along her anastomosis she will be admitted for hydration given her hyponatremia. I discussed this with the patient and admitted her to the hospitalist service.  Rebecka Apley, MD 11/19/14 [id number]

## 2014-11-24 ENCOUNTER — Encounter: Payer: Self-pay | Admitting: Family Medicine

## 2014-11-24 ENCOUNTER — Ambulatory Visit (INDEPENDENT_AMBULATORY_CARE_PROVIDER_SITE_OTHER): Payer: 59 | Admitting: Family Medicine

## 2014-11-24 VITALS — BP 114/80 | HR 64 | Temp 98.7°F | Resp 16 | Ht 68.0 in | Wt 200.8 lb

## 2014-11-24 DIAGNOSIS — F419 Anxiety disorder, unspecified: Secondary | ICD-10-CM

## 2014-11-24 DIAGNOSIS — R7989 Other specified abnormal findings of blood chemistry: Secondary | ICD-10-CM | POA: Diagnosis not present

## 2014-11-24 DIAGNOSIS — K5900 Constipation, unspecified: Secondary | ICD-10-CM | POA: Diagnosis not present

## 2014-11-24 DIAGNOSIS — F329 Major depressive disorder, single episode, unspecified: Secondary | ICD-10-CM | POA: Diagnosis not present

## 2014-11-24 DIAGNOSIS — F32A Depression, unspecified: Secondary | ICD-10-CM

## 2014-11-24 DIAGNOSIS — F411 Generalized anxiety disorder: Secondary | ICD-10-CM | POA: Insufficient documentation

## 2014-11-24 MED ORDER — PSYLLIUM 58.6 % PO POWD: 1.0000 | Freq: Three times a day (TID) | ORAL | Status: DC

## 2014-11-24 MED ORDER — DOCUSATE SODIUM 100 MG PO CAPS: 100.0000 mg | ORAL_CAPSULE | Freq: Two times a day (BID) | ORAL | Status: DC

## 2014-11-24 NOTE — Patient Instructions (Signed)
Continue Duloxitine at current dose.

## 2014-11-24 NOTE — Progress Notes (Signed)
Name: Mary Fox   MRN: [id number]    DOB: 1985-12-02   Date:11/24/2014       Progress Note  Subjective  Chief Complaint  Chief Complaint  Patient presents with  . Hospitalization Follow-up    low sodium in hospital 08/02 and discharge 08/03     HPI  For f/u of ER visit 11/18/14.  Had L. Upper abd. Pain.  No cause of pain found, but found to have low sodium (123).  Here for f/u. No problem-specific assessment & plan notes found for this encounter.   Past Medical History  Diagnosis Date  . Anxiety     History  Substance Use Topics  . Smoking status: Never Smoker   . Smokeless tobacco: Not on file  . Alcohol Use: 1.2 oz/week    2 Glasses of wine per week     Current outpatient prescriptions:  .  Cetirizine HCl 10 MG CAPS, Take by mouth., Disp: , Rfl:  .  DULoxetine (CYMBALTA) 60 MG capsule, Take by mouth., Disp: , Rfl:  .  escitalopram (LEXAPRO) 20 MG tablet, Take by mouth., Disp: , Rfl:  .  Multiple Vitamin (MULTIVITAMIN) capsule, Take 1 capsule by mouth daily., Disp: , Rfl:  .  pantoprazole (PROTONIX) 40 MG tablet, Take 1 tablet (40 mg total) by mouth 2 (two) times daily., Disp: 30 tablet, Rfl: 0  No Known Allergies  Review of Systems  Constitutional: Negative for fever, chills, weight loss and malaise/fatigue.  HENT: Negative for hearing loss.   Eyes: Negative for blurred vision and double vision.  Respiratory: Negative for cough, sputum production, shortness of breath and wheezing.   Cardiovascular: Negative for chest pain, palpitations and leg swelling.  Gastrointestinal: Positive for abdominal pain (resolved). Negative for heartburn, nausea, vomiting, diarrhea and blood in stool.  Genitourinary: Negative for dysuria, urgency and frequency.  Musculoskeletal: Negative for myalgias and joint pain.  Skin: Negative for rash.  Neurological: Negative for dizziness, tremors, sensory change, speech change, focal weakness, loss of consciousness, weakness and headaches.   Psychiatric/Behavioral: Depression: stable. Nervous/anxious: stable.       Objective  Filed Vitals:   11/24/14 1447  BP: 114/80  Pulse: 64  Temp: 98.7 F (37.1 C)  TempSrc: Oral  Resp: 16  Height: _0  (1.727 m)  Weight: 200 lb 12.8 oz (91.082 kg)     Physical Exam  Constitutional: She is oriented to person, place, and time and well-developed, well-nourished, and in no distress. No distress.  HENT:  Head: Normocephalic and atraumatic.  Eyes: Conjunctivae and EOM are normal. Pupils are equal, round, and reactive to light. No scleral icterus.  Neck: Normal range of motion. Neck supple. No thyromegaly present.  Cardiovascular: Normal rate, regular rhythm and intact distal pulses.  Exam reveals no gallop and no friction rub.   No murmur heard. Pulmonary/Chest: Effort normal and breath sounds normal. No respiratory distress. She has no wheezes. She has no rales.  Abdominal: Soft. Bowel sounds are normal. She exhibits no distension and no mass. There is tenderness (mild vague difuse tenderness).  Musculoskeletal: She exhibits no edema or tenderness.  Lymphadenopathy:    She has no cervical adenopathy.  Neurological: She is alert and oriented to person, place, and time. No cranial nerve deficit. Gait normal. Coordination normal.  Vitals reviewed.     Recent Results (from the past 2160 hour(s))  CBC WITH DIFFERENTIAL     Status: None   Collection Time: 11/18/14  7:37 PM  Result Value Ref Range  WBC 8.3 3.6 - 11.0 K/uL   RBC 4.70 3.80 - 5.20 MIL/uL   Hemoglobin 13.2 12.0 - 16.0 g/dL   HCT 39.5 35.0 - 47.0 %   MCV 84.0 80.0 - 100.0 fL   MCH 28.2 26.0 - 34.0 pg   MCHC 33.5 32.0 - 36.0 g/dL   RDW 12.9 11.5 - 14.5 %   Platelets 227 150 - 440 K/uL   Neutrophils Relative % 57 %   Neutro Abs 4.7 1.4 - 6.5 K/uL   Lymphocytes Relative 36 %   Lymphs Abs 3.0 1.0 - 3.6 K/uL   Monocytes Relative 6 %   Monocytes Absolute 0.5 0.2 - 0.9 K/uL   Eosinophils Relative 1 %    Eosinophils Absolute 0.1 0 - 0.7 K/uL   Basophils Relative 0 %   Basophils Absolute 0.0 0 - 0.1 K/uL  Comprehensive metabolic panel     Status: Abnormal   Collection Time: 11/18/14  7:37 PM  Result Value Ref Range   Sodium 123 (L) 135 - 145 mmol/L   Potassium 3.7 3.5 - 5.1 mmol/L   Chloride 88 (L) 101 - 111 mmol/L   CO2 24 22 - 32 mmol/L   Glucose, Bld 117 (H) 65 - 99 mg/dL   BUN 17 6 - 20 mg/dL   Creatinine, Ser 0.88 0.44 - 1.00 mg/dL   Calcium 7.7 (L) 8.9 - 10.3 mg/dL   Total Protein 5.9 (L) 6.5 - 8.1 g/dL   Albumin 2.8 (L) 3.5 - 5.0 g/dL   AST 101 (H) 15 - 41 U/L   ALT 153 (H) 14 - 54 U/L   Alkaline Phosphatase 99 38 - 126 U/L   Total Bilirubin 1.9 (H) 0.3 - 1.2 mg/dL   GFR calc non Af Amer >60 >60 mL/min   GFR calc Af Amer >60 >60 mL/min    Comment: (NOTE) The eGFR has been calculated using the CKD EPI equation. This calculation has not been validated in all clinical situations. eGFR's persistently <60 mL/min signify possible Chronic Kidney Disease.    Anion gap 11 5 - 15  Lipase, blood     Status: Abnormal   Collection Time: 11/18/14  7:37 PM  Result Value Ref Range   Lipase 13 (L) 22 - 51 U/L  Urinalysis complete, with microscopic ([facility] only)     Status: Abnormal   Collection Time: 11/18/14 11:10 PM  Result Value Ref Range   Color, Urine STRAW (A) YELLOW   APPearance CLEAR (A) CLEAR   Glucose, UA NEGATIVE NEGATIVE mg/dL   Bilirubin Urine NEGATIVE NEGATIVE   Ketones, ur NEGATIVE NEGATIVE mg/dL   Specific Gravity, Urine 1.002 (L) 1.005 - 1.030   Hgb urine dipstick NEGATIVE NEGATIVE   pH 6.0 5.0 - 8.0   Protein, ur NEGATIVE NEGATIVE mg/dL   Nitrite NEGATIVE NEGATIVE   Leukocytes, UA NEGATIVE NEGATIVE   RBC / HPF 0-5 0 - 5 RBC/hpf   WBC, UA 0-5 0 - 5 WBC/hpf   Bacteria, UA NONE SEEN NONE SEEN   Squamous Epithelial / LPF 0-5 (A) NONE SEEN   Mucous PRESENT   Pregnancy, urine POC     Status: None   Collection Time: 11/18/14 11:14 PM  Result Value Ref Range   Preg  Test, Ur NEGATIVE NEGATIVE    Comment:        THE SENSITIVITY OF THIS METHODOLOGY IS >24 mIU/mL   CBC     Status: Abnormal   Collection Time: 11/19/14  5:21 AM  Result Value  Ref Range   WBC 6.5 3.6 - 11.0 K/uL   RBC 4.20 3.80 - 5.20 MIL/uL   Hemoglobin 11.7 (L) 12.0 - 16.0 g/dL   HCT 35.2 35.0 - 47.0 %   MCV 83.8 80.0 - 100.0 fL   MCH 27.9 26.0 - 34.0 pg   MCHC 33.3 32.0 - 36.0 g/dL   RDW 13.0 11.5 - 14.5 %   Platelets 194 150 - 440 K/uL  Comprehensive metabolic panel     Status: Abnormal   Collection Time: 11/19/14  5:23 AM  Result Value Ref Range   Sodium 140 135 - 145 mmol/L   Potassium 3.6 3.5 - 5.1 mmol/L   Chloride 109 101 - 111 mmol/L   CO2 24 22 - 32 mmol/L   Glucose, Bld 74 65 - 99 mg/dL   BUN 7 6 - 20 mg/dL   Creatinine, Ser 0.53 0.44 - 1.00 mg/dL   Calcium 8.5 (L) 8.9 - 10.3 mg/dL   Total Protein 5.8 (L) 6.5 - 8.1 g/dL   Albumin 3.3 (L) 3.5 - 5.0 g/dL   AST 15 15 - 41 U/L   ALT 15 14 - 54 U/L   Alkaline Phosphatase 68 38 - 126 U/L   Total Bilirubin 0.4 0.3 - 1.2 mg/dL   GFR calc non Af Amer >60 >60 mL/min   GFR calc Af Amer >60 >60 mL/min    Comment: (NOTE) The eGFR has been calculated using the CKD EPI equation. This calculation has not been validated in all clinical situations. eGFR's persistently <60 mL/min signify possible Chronic Kidney Disease.    Anion gap 7 5 - 15  TSH     Status: None   Collection Time: 11/19/14  5:23 AM  Result Value Ref Range   TSH 3.965 0.350 - 4.500 uIU/mL     Assessment & Plan  1. Acute anxiety   2. Depression   3. Low serum sodium  - Basic Metabolic Panel (BMET)  4. Constipation, unspecified constipation type  - psyllium (METAMUCIL SMOOTH TEXTURE) 58.6 % powder; Take 1 packet by mouth 3 (three) times daily.  Dispense: 283 g; Refill: 12 - docusate sodium (COLACE) 100 MG capsule; Take 1 capsule (100 mg total) by mouth 2 (two) times daily.  Dispense: 10 capsule; Refill: 0

## 2014-11-26 LAB — BASIC METABOLIC PANEL
BUN / CREAT RATIO: 14 (ref 8–20)
BUN: 9 mg/dL (ref 6–20)
CO2: 23 mmol/L (ref 18–29)
CREATININE: 0.63 mg/dL (ref 0.57–1.00)
Calcium: 8.8 mg/dL (ref 8.7–10.2)
Chloride: 104 mmol/L (ref 97–108)
GFR, EST AFRICAN AMERICAN: 138 mL/min/{1.73_m2} (ref >59)
GFR, EST NON AFRICAN AMERICAN: 120 mL/min/{1.73_m2} (ref >59)
Glucose: 75 mg/dL (ref 65–99)
Potassium: 4.2 mmol/L (ref 3.5–5.2)
Sodium: 143 mmol/L (ref 134–144)

## 2015-04-02 ENCOUNTER — Encounter: Payer: Self-pay | Admitting: Family Medicine

## 2015-04-02 ENCOUNTER — Ambulatory Visit (INDEPENDENT_AMBULATORY_CARE_PROVIDER_SITE_OTHER): Payer: 59 | Admitting: Family Medicine

## 2015-04-02 VITALS — BP 120/83 | HR 67 | Temp 98.5°F | Resp 16 | Ht 68.0 in | Wt 198.0 lb

## 2015-04-02 DIAGNOSIS — F329 Major depressive disorder, single episode, unspecified: Secondary | ICD-10-CM | POA: Diagnosis not present

## 2015-04-02 DIAGNOSIS — F419 Anxiety disorder, unspecified: Secondary | ICD-10-CM

## 2015-04-02 DIAGNOSIS — F32A Depression, unspecified: Secondary | ICD-10-CM

## 2015-04-02 MED ORDER — CLONAZEPAM 0.5 MG PO TABS: 0.5000 mg | ORAL_TABLET | Freq: Two times a day (BID) | ORAL | Status: DC | PRN

## 2015-04-02 NOTE — Patient Instructions (Signed)
Referred to [facility] for further care of her anxiety and depression.

## 2015-04-02 NOTE — Progress Notes (Signed)
Name: Mary Fox   MRN: [id number]    DOB: [birth year]   Date:04/02/2015       Progress Note  Subjective  Chief Complaint  Chief Complaint  Patient presents with  . Anxiety    cymbalta not improving Sx    Anxiety Symptoms include nervous/anxious behavior. Patient reports no chest pain, palpitations or shortness of breath.     Here for f/u of depression and anxiety.  Has been on 60 mg d .  Has been on this dose for quite some time.  Her anxiety has gotten much worse over past 2-3 weeks.  Nothing caused this that shew is aware of.  Husband has to call her reassure her aout anything.  She is crying easily.  Anxious about death.  Hs been anxious about this since she was a child.  Affecting work and home now.  She declines to se a therapist and declines to take suggested dose of Cymbalta.  She just wants anxiety medication.  Will give her some for 1 month. Of Klonipin until she can see a Psychiatrist for her further treatment. No problem-specific assessment & plan notes found for this encounter.   Past Medical History  Diagnosis Date  . Anxiety     Past Surgical History  Procedure Laterality Date  . Gastric bypass    . Cholecystectomy  07 31 2012    Family History  Problem Relation Age of Onset  . Diabetes Neg Hx     Social History   Social History  . Marital Status: Married    Spouse Name: N/A  . Number of Children: N/A  . Years of Education: N/A   Occupational History  . Not on file.   Social History Main Topics  . Smoking status: Never Smoker   . Smokeless tobacco: Not on file  . Alcohol Use: 1.2 oz/week    2 Glasses of wine per week  . Drug Use: No  . Sexual Activity: Yes    Birth Control/ Protection: None   Other Topics Concern  . Not on file   Social History Narrative     Current outpatient prescriptions:  .  Cetirizine HCl 10 MG CAPS, Take by mouth., Disp: , Rfl:  .  docusate sodium (COLACE) 100 MG capsule, Take 1 capsule (100 mg total) by mouth 2  (two) times daily., Disp: 10 capsule, Rfl: 0 .  DULoxetine (CYMBALTA) 60 MG capsule, Take by mouth., Disp: , Rfl:  .  Multiple Vitamin (MULTIVITAMIN) capsule, Take 1 capsule by mouth daily., Disp: , Rfl:  .  clonazePAM (KLONOPIN) 0.5 MG tablet, Take 1 tablet (0.5 mg total) by mouth 2 (two) times daily as needed for anxiety., Disp: 60 tablet, Rfl: 0  Not on File   Review of Systems  Constitutional: Negative for fever, chills, weight loss and malaise/fatigue.  Respiratory: Negative for cough, sputum production, shortness of breath and wheezing.   Cardiovascular: Negative for chest pain, palpitations and leg swelling.  Gastrointestinal: Negative for heartburn, abdominal pain and blood in stool.  Genitourinary: Negative for dysuria, urgency and frequency.  Neurological: Negative for weakness.  Psychiatric/Behavioral: Positive for depression. The patient is nervous/anxious.       Objective  Filed Vitals:   04/02/15 1057  BP: 120/83  Pulse: 67  Temp: 98.5 F (36.9 C)  TempSrc: Oral  Resp: 16  Height:  (1.727 m)  Weight: 198 lb (89.812 kg)    Physical Exam  Constitutional: She is oriented to person, place, and time and  well-developed, well-nourished, and in no distress. No distress.  HENT:  Head: Normocephalic and atraumatic.  Neck: Normal range of motion. Neck supple. Carotid bruit is not present. No thyromegaly present.  Cardiovascular: Normal rate, regular rhythm and normal heart sounds.  Exam reveals no gallop and no friction rub.   No murmur heard. Pulmonary/Chest: Effort normal and breath sounds normal. No respiratory distress. She has no wheezes. She has no rales.  Abdominal: Soft. Bowel sounds are normal. She exhibits no distension, no abdominal bruit and no mass. There is no tenderness.  Lymphadenopathy:    She has no cervical adenopathy.  Neurological: She is alert and oriented to person, place, and time.  Psychiatric:  Anxious with depressed affect.  Vitals  reviewed.      No results found for this or any previous visit (from the past 2160 hour(s)).   Assessment & Plan  Problem List Items Addressed This Visit      Other   Acute anxiety   Relevant Orders   Ambulatory referral to Psychiatry   Depression - Primary   Relevant Orders   Ambulatory referral to Psychiatry      Meds ordered this encounter  Medications  . clonazePAM (KLONOPIN) 0.5 MG tablet    Sig: Take 1 tablet (0.5 mg total) by mouth 2 (two) times daily as needed for anxiety.    Dispense:  60 tablet    Refill:  0   1. Depression -cont. Cymbalta - Ambulatory referral to Psychiatry  2. Acute anxiety -start Clonazepam x 1 month until Pyschiatry takes over care.- Ambulatory referral to Psychiatry  I am turning over her care for depression and anxiety to Psychiatry from today on.  I will not further prescribe medications for this condition here.  All needs to be under the care of Psychiatrist.

## 2015-05-11 ENCOUNTER — Other Ambulatory Visit: Payer: Self-pay | Admitting: Family Medicine

## 2015-05-25 ENCOUNTER — Other Ambulatory Visit: Payer: Self-pay | Admitting: Family Medicine

## 2015-06-12 ENCOUNTER — Encounter: Payer: Self-pay | Admitting: Family Medicine

## 2015-06-12 ENCOUNTER — Ambulatory Visit (INDEPENDENT_AMBULATORY_CARE_PROVIDER_SITE_OTHER): Payer: 59 | Admitting: Family Medicine

## 2015-06-12 VITALS — BP 104/66 | HR 71 | Temp 98.7°F | Ht 68.0 in | Wt 196.0 lb

## 2015-06-12 DIAGNOSIS — F411 Generalized anxiety disorder: Secondary | ICD-10-CM | POA: Diagnosis not present

## 2015-06-12 DIAGNOSIS — Z5181 Encounter for therapeutic drug level monitoring: Secondary | ICD-10-CM | POA: Diagnosis not present

## 2015-06-12 MED ORDER — DULOXETINE HCL 60 MG PO CPEP: 60.0000 mg | ORAL_CAPSULE | Freq: Every day | ORAL | Status: DC

## 2015-06-12 MED ORDER — CLONAZEPAM 0.5 MG PO TABS: 0.5000 mg | ORAL_TABLET | Freq: Two times a day (BID) | ORAL | Status: DC | PRN

## 2015-06-12 NOTE — Assessment & Plan Note (Signed)
Symptoms most consistent with GAD. Somewhat well controlled on Cymbalta and Klonopin as needed. Patient does not want to change her Cymbalta dose. She is happy with the control she has right now. Uses minimal Klonopin. We will provide a refill of her Cymbalta and Klonopin. She has a history of hyponatremia while on these medicines and thus we'll recheck a sodium today. She is given return precautions. We'll follow up in 3 months.

## 2015-06-12 NOTE — Patient Instructions (Signed)
Nice to meet you. We'll treat your anxiety with continuing Cymbalta and Klonopin. We will check your sodium and kidney function to ensure this is stable on the Cymbalta. If you develop depression, worsening anxiety, thoughts of harming herself or others, or any new or change in symptoms please seek medical attention.

## 2015-06-12 NOTE — Progress Notes (Signed)
Pre visit review using our clinic review tool, if applicable. No additional management support is needed unless otherwise documented below in the visit note. 

## 2015-06-12 NOTE — Progress Notes (Signed)
Patient ID: Mary Fox, female   DOB: 02/25/1986, 30 y.o.   MRN: [id number]  Marikay Alar, MD Phone: [phone]  Mary Fox is a 30 y.o. female who presents today for new patient visit.  Anxiety: Patient notes dealing with anxiety since she was 30 years old. She notes anything can trigger it. Most recently her promotion triggered anxiety. She continuously thinks something bad is going to happen and she thinks about death as well. She has no thoughts of harming herself or others. Denies depression. She notes the Cymbalta has made quite a difference. She notes Klonopin helps when she has worsening anxiety. She typically takes as needed. At most she takes it 7 times per month. Only takes 1 per day. Last took 2 weeks ago. She's previously been on Zoloft, Effexor, Wellbutrin, and Lexapro with no prolonged benefit. Notes her insurance company will not cover psychology or psychiatry.  Active Ambulatory Problems    Diagnosis Date Noted  . Hyponatremia 11/19/2014  . 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 Generalized anxiety disorder 11/24/2014  . Depression 11/24/2014   Resolved Ambulatory Problems    Diagnosis Date Noted  . No Resolved Ambulatory Problems   Past Medical History  Diagnosis Date  . Anxiety     Family History  Problem Relation Age of Onset  . Diabetes Neg Hx     Social History   Social History  . Marital Status: Married    Spouse Name: N/A  . Number of Children: N/A  . Years of Education: N/A   Occupational History  . Not on file.   Social History Main Topics  . Smoking status: Never Smoker   . Smokeless tobacco: Not on file  . Alcohol Use: 1.2 oz/week    2 Glasses of wine per week  . Drug Use: No  . Sexual Activity: Yes    Birth Control/ Protection: None   Other Topics Concern  . Not on file   Social History Narrative    ROS   General:  Negative for nexplained weight loss, fever Skin: Negative for new or changing  mole, sore that won't heal HEENT: Negative for trouble hearing, trouble seeing, ringing in ears, mouth sores, hoarseness, change in voice, dysphagia. CV:  Negative for chest pain, dyspnea, edema, palpitations Resp: Negative for cough, dyspnea, hemoptysis GI: Negative for nausea, vomiting, diarrhea, constipation, abdominal pain, melena, hematochezia. GU: Negative for dysuria, incontinence, urinary hesitance, hematuria, vaginal or penile discharge, polyuria, sexual difficulty, lumps in testicle or breasts MSK: Negative for muscle cramps or aches, joint pain or swelling Neuro: Negative for headaches, weakness, numbness, dizziness, passing out/fainting Psych: Positive for anxiety, Negative for depression, memory problems  Objective  Physical Exam Filed Vitals:   06/12/15 1327  BP: 104/66  Pulse: 71  Temp: 98.7 F (37.1 C)    BP Readings from Last 3 Encounters:  06/12/15 104/66  04/02/15 120/83  11/24/14 114/80   Wt Readings from Last 3 Encounters:  06/12/15 196 lb (88.905 kg)  04/02/15 198 lb (89.812 kg)  11/24/14 200 lb 12.8 oz (91.082 kg)    Physical Exam  Constitutional: She is well-developed, well-nourished, and in no distress.  HENT:  Head: Normocephalic and atraumatic.  Right Ear: External ear normal.  Left Ear: External ear normal.  Mouth/Throat: Oropharynx is clear and moist.  Eyes: Conjunctivae are normal. Pupils are equal, round, and reactive to light.  Neck: Neck supple.  Cardiovascular: Normal rate, regular rhythm and normal heart sounds.  Exam reveals  no gallop and no friction rub.   No murmur heard. Pulmonary/Chest: Effort normal and breath sounds normal. No respiratory distress. She has no wheezes. She has no rales.  Abdominal: Soft. Bowel sounds are normal. She exhibits no distension. There is no tenderness. There is no rebound and no guarding.  Musculoskeletal: She exhibits no edema.  Lymphadenopathy:    She has no cervical adenopathy.  Neurological: She  is alert. Gait normal.  Skin: Skin is warm and dry. She is not diaphoretic.  Psychiatric: Mood and affect normal.  Normal mood today     Assessment/Plan:   Generalized anxiety disorder Symptoms most consistent with GAD. Somewhat well controlled on Cymbalta and Klonopin as needed. Patient does not want to change her Cymbalta dose. She is happy with the control she has right now. Uses minimal Klonopin. We will provide a refill of her Cymbalta and Klonopin. She has a history of hyponatremia while on these medicines and thus we'll recheck a sodium today. She is given return precautions. We'll follow up in 3 months.    Orders Placed This Encounter  Procedures  . Basic Metabolic Panel (BMET)    Meds ordered this encounter  Medications  . clonazePAM (KLONOPIN) 0.5 MG tablet    Sig: Take 1 tablet (0.5 mg total) by mouth 2 (two) times daily as needed for anxiety.    Dispense:  60 tablet    Refill:  0  . DULoxetine (CYMBALTA) 60 MG capsule    Sig: Take 1 capsule (60 mg total) by mouth daily.    Dispense:  30 capsule    Refill:  3     Marikay Alar

## 2015-12-02 ENCOUNTER — Encounter: Payer: Self-pay | Admitting: Psychiatry

## 2015-12-02 ENCOUNTER — Ambulatory Visit (INDEPENDENT_AMBULATORY_CARE_PROVIDER_SITE_OTHER): Payer: 59 | Admitting: Psychiatry

## 2015-12-02 VITALS — BP 116/74 | HR 80 | Temp 98.1°F | Ht 68.0 in | Wt 206.4 lb

## 2015-12-02 DIAGNOSIS — F39 Unspecified mood [affective] disorder: Secondary | ICD-10-CM | POA: Diagnosis not present

## 2015-12-02 DIAGNOSIS — F410 Panic disorder [episodic paroxysmal anxiety] without agoraphobia: Secondary | ICD-10-CM | POA: Diagnosis not present

## 2015-12-02 MED ORDER — QUETIAPINE FUMARATE 50 MG PO TABS: 50.0000 mg | ORAL_TABLET | Freq: Every day | ORAL | 0 refills | Status: DC

## 2015-12-02 MED ORDER — CLONAZEPAM 0.5 MG PO TABS: 0.2500 mg | ORAL_TABLET | Freq: Every day | ORAL | 0 refills | Status: DC | PRN

## 2015-12-02 MED ORDER — VENLAFAXINE HCL ER 75 MG PO CP24: 75.0000 mg | ORAL_CAPSULE | Freq: Every day | ORAL | 1 refills | Status: DC

## 2015-12-02 NOTE — Progress Notes (Signed)
Psychiatric Initial Adult Assessment   Patient Identification: Mary Fox MRN:  [id number] Date of Evaluation:  12/02/2015 Referral Source: Antony HasteSonnenburg, M.D Chief Complaint:   Visit Diagnosis:    ICD-9-CM ICD-10-CM   1. Episodic mood disorder (HCC) 296.90 F39   2. Panic disorder 300.01 F41.0     History of Present Illness:    Patient is a 30 year old married female who presented for initial assessment. She reported long history of depression and anxiety insomnia as she has been trying to become pregnant for the past several years. Patient reported that she has tried IVF but it is not working. She stopped the treatment in June. Patient reported that she has been feeling depressed and anxious and has started taking Klonopin on a when necessary basis which was prescribed by Dr. Birdie SonsSonnenberg in March. She was tearful and depressed during the interview. She reported that she desperately wants to become pregnant but is unsuccessful as there are always issues. Recently there was abnormal biopsy 3 weeks ago as there was endometrial inflammation and the thickening was inflamed and she has started taking antibiotics for the same. [facility] [place] [facility]. She reported that she is also seeing her GYN on a regular basis at [facility]. Patient reported that her husband had the vasectomy done in the past but it was reversed. He already has a 32 year old child. He is not much concern and always asks her to move on. However it makes her agitated and angry as she is focused on becoming pregnant. When she recently found out that her sister-in-Demetro is pregnant she became irate as she was not expecting. SHe reported that now she is helping her with writing up the baby shower and is excited about the upcoming baby in January She reported that she is not excited about adopting and the child as she already has 8 frozen embryos and she is waiting for the right time and has already invested so much money  for the IVF. She wants to have her own child and is anxious about the same. She reported that she did not sleep at night and has racing thoughts. Her boss also comes and checks on her and she looks depressed and tearful at work as well.  She currently denied having any suicidal ideations or plans. She denied having any homicidal ideations at this time.   Associated Signs/Symptoms: Depression Symptoms:  depressed mood, insomnia, fatigue, hopelessness, anxiety, loss of energy/fatigue, disturbed sleep, (Hypo) Manic Symptoms:  Impulsivity, Irritable Mood, Labiality of Mood, Anxiety Symptoms:  Excessive Worry, Psychotic Symptoms:  none PTSD Symptoms: Negative NA  Past Psychiatric History:  Started treatment for anxiety- age 30. She reported that she has never attempted suicide. No history of psychiatric hospitalization  Previous Psychotropic Medications:  Wellbutrin, Lexapro, Paxil , effexor. She has taken Cymbalta - stopped due to IVC Tried Zoloft x 1 month Taken Klonopin 0.5mg  prn   Substance Abuse History in the last 12 months:  No.  Consequences of Substance Abuse: Negative NA  Past Medical History:  Past Medical History:  Diagnosis Date  . Anxiety     Past Surgical History:  Procedure Laterality Date  . CHOLECYSTECTOMY  07 31 2012  . GASTRIC BYPASS      Family Psychiatric History:  Sister- alcoholic, h/o suicide attempts.  Father has MS- Takes Abilify   Family History:  Family History  Problem Relation Age of Onset  . Diabetes Neg Hx     Social History:   Social  History   Social History  . Marital status: Married    Spouse name: N/A  . Number of children: N/A  . Years of education: N/A   Social History Main Topics  . Smoking status: Never Smoker  . Smokeless tobacco: Not on file  . Alcohol use 1.2 oz/week    2 Glasses of wine per week  . Drug use: No  . Sexual activity: Yes    Birth control/ protection: None   Other Topics Concern  . Not  on file   Social History Narrative  . No narrative on file    Additional Social History:  Married x 9 years No children.  Works in Administrator, sports.   Allergies:  No Known Allergies  Metabolic Disorder Labs: No results found for: HGBA1C, MPG No results found for: PROLACTIN No results found for: CHOL, TRIG, HDL, CHOLHDL, VLDL, LDLCALC   Current Medications: Current Outpatient Prescriptions  Medication Sig Dispense Refill  . Cetirizine HCl 10 MG CAPS Take by mouth. Reported on 06/12/2015    . clonazePAM (KLONOPIN) 0.5 MG tablet Take 0.5 tablets (0.25 mg total) by mouth daily as needed for anxiety. Pt has supply 12 tablet 0  . docusate sodium (COLACE) 100 MG capsule Take 1 capsule (100 mg total) by mouth 2 (two) times daily. (Patient not taking: Reported on 06/12/2015) 10 capsule 0  . Multiple Vitamin (MULTIVITAMIN) capsule Take 1 capsule by mouth daily.    . QUEtiapine (SEROQUEL) 50 MG tablet Take 1 tablet (50 mg total) by mouth at bedtime. 30 tablet 0  . venlafaxine XR (EFFEXOR XR) 75 MG 24 hr capsule Take 1 capsule (75 mg total) by mouth daily with breakfast. 30 capsule 1   No current facility-administered medications for this visit.     Neurologic: Headache: No Seizure: No Paresthesias:No  Musculoskeletal: Strength & Muscle Tone: within normal limits Gait & Station: normal Patient leans: N/A  Psychiatric Specialty Exam: Review of Systems  Constitutional: Positive for malaise/fatigue.  Musculoskeletal: Positive for neck pain.  Psychiatric/Behavioral: Positive for depression. The patient is nervous/anxious and has insomnia.   All other systems reviewed and are negative.   There were no vitals taken for this visit.There is no height or weight on file to calculate BMI.  General Appearance: Casual  Eye Contact:  Fair  Speech:  Clear and Coherent  Volume:  Decreased  Mood:  Anxious and Depressed  Affect:  Congruent and Depressed  Thought Process:  Goal Directed   Orientation:  Full (Time, Place, and Person)  Thought Content:  WDL  Suicidal Thoughts:  No  Homicidal Thoughts:  No  Memory:  Immediate;   Fair Recent;   Fair Remote;   Fair  Judgement:  Intact  Insight:  Fair  Psychomotor Activity:  Normal  Concentration:  Concentration: Fair and Attention Span: Fair  Recall:  [employer] of Knowledge:Fair  Language: Fair  Akathisia:  No  Handed:  Right  AIMS (if indicated):    Assets:  Communication Skills Desire for Improvement Physical Health Social Support  ADL's:  Intact  Cognition: WNL  Sleep:  poor    Treatment Plan Summary: Medication management   Discussed with patient what the medications treatment risks benefits and alternatives.  I will start her on Seroquel 50 mg by mouth daily at bedtime for mood stabilization depression and anxiety. She agreed with the plan. I will also start her on Effexor XR 75 mg in the morning for anxiety symptoms. Patient is taking Klonopin on  a when necessary basis and she is supply of the medication. She will start therapy with Joni Reiningicole on a regular basis  Follow-up in 2 weeks or earlier depending on her symptoms   More than 50% of the time spent in psychoeducation, counseling and coordination of care.    This note was generated in part or whole with voice recognition software. Voice regonition is usually quite accurate but there are transcription errors that can and very often do occur. I apologize for any typographical errors that were not detected and corrected.    Brandy HaleUzma Stacie Templin, MD 8/16/20179:34 AM

## 2015-12-15 ENCOUNTER — Encounter: Payer: Self-pay | Admitting: Psychiatry

## 2015-12-15 ENCOUNTER — Ambulatory Visit (INDEPENDENT_AMBULATORY_CARE_PROVIDER_SITE_OTHER): Payer: 59 | Admitting: Psychiatry

## 2015-12-15 ENCOUNTER — Ambulatory Visit: Payer: 59 | Admitting: Licensed Clinical Social Worker

## 2015-12-15 VITALS — BP 116/82 | HR 67 | Temp 99.3°F | Ht 68.0 in | Wt 209.0 lb

## 2015-12-15 DIAGNOSIS — F410 Panic disorder [episodic paroxysmal anxiety] without agoraphobia: Secondary | ICD-10-CM

## 2015-12-15 DIAGNOSIS — F39 Unspecified mood [affective] disorder: Secondary | ICD-10-CM | POA: Diagnosis not present

## 2015-12-15 MED ORDER — CLONAZEPAM 0.5 MG PO TABS: 0.2500 mg | ORAL_TABLET | Freq: Every day | ORAL | 0 refills | Status: DC | PRN

## 2015-12-15 NOTE — Progress Notes (Signed)
Psychiatric MD Progress Note   Patient Identification: Mary Fox MRN:  [id number] Date of Evaluation:  12/15/2015 Referral Source: Antony Haste, M.D Chief Complaint:   Chief Complaint    Follow-up; Medication Refill     Visit Diagnosis:    ICD-9-CM ICD-10-CM   1. Episodic mood disorder (HCC) 296.90 F39   2. Panic disorder 300.01 F41.0     History of Present Illness:    Patient is a 30 year old married female who presented for follow up. She reported long history of depression and anxiety insomnia as she has been trying to become pregnant for the past several years. Patient reported that she has tried IVF but it is not working. She stopped the treatment in June. Patient reported that she has been feeling depressed and anxious and has started taking Klonopin on a when necessary basis which was prescribed by Dr. Birdie Sons in March. She was tearful and depressed during the interview. She reported that she desperately wants to become pregnant but is unsuccessful as there are always issues. Recently there was abnormal biopsy 3 weeks ago as there was endometrial inflammation and the thickening was inflamed and she has started taking antibiotics for the same. She is following with the specialist in [facility]. She reported that she is also seeing her GYN on a regular basis at [facility]. Patient reported that her husband had the vasectomy done in the past but it was reversed. He already has a 68 year old child. He is not much concern and always asks her to move on. However it makes her agitated and angry as she is focused on becoming pregnant.  Patient seen for follow-up. Patient reported that she has stopped taking the Seroquel after 3 days as she was concerned about changes in her menstrual cycle. She reported that she has not had a period since the past week. She is more focused on her missing the menstrual cycle. She reported that she is still anxious and has been taking Klonopin on a when necessary  basis. She reported that she is constantly thinking about becoming pregnant and is not improving. She keeps on ruminating about the same thoughts. She reported that she is more analytical and is planning and discussing about the dates with her husband who is becoming frustrated with her behavior. She currently denied having any side effects from the Effexor. She has been compliant with her medications.  She currently denied having any suicidal ideations or plans. She denied having any homicidal ideations at this time.   Associated Signs/Symptoms: Depression Symptoms:  depressed mood, insomnia, fatigue, hopelessness, anxiety, loss of energy/fatigue, disturbed sleep, (Hypo) Manic Symptoms:  Impulsivity, Irritable Mood, Labiality of Mood, Anxiety Symptoms:  Excessive Worry, Psychotic Symptoms:  none PTSD Symptoms: Negative NA  Past Psychiatric History:  Started treatment for anxiety- age 44. She reported that she has never attempted suicide. No history of psychiatric hospitalization  Previous Psychotropic Medications:  Wellbutrin, Lexapro, Paxil , effexor. She has taken Cymbalta - stopped due to IVC Tried Zoloft x 1 month Taken Klonopin 0.5mg  prn   Substance Abuse History in the last 12 months:  No.  Consequences of Substance Abuse: Negative NA  Past Medical History:  Past Medical History:  Diagnosis Date  . Anxiety   . Depression     Past Surgical History:  Procedure Laterality Date  . CHOLECYSTECTOMY  07 31 2012  . GASTRIC BYPASS      Family Psychiatric History:  Sister- alcoholic, h/o suicide attempts.  Father has MS- Takes Abilify  Family History:  Family History  Problem Relation Age of Onset  . Alcohol abuse Mother   . Bipolar disorder Sister   . Alcohol abuse Sister   . Anxiety disorder Sister   . Depression Sister   . Drug abuse Sister   . Diabetes Neg Hx     Social History:   Social History   Social History  . Marital status: Married     Spouse name: N/A  . Number of children: N/A  . Years of education: N/A   Social History Main Topics  . Smoking status: Former Smoker    Quit date: 11/05/2010  . Smokeless tobacco: Never Used  . Alcohol use 1.2 oz/week    2 Glasses of wine per week  . Drug use: No  . Sexual activity: Yes    Birth control/ protection: None   Other Topics Concern  . None   Social History Narrative  . None    Additional Social History:  Married x 9 years No children.  Works in Administrator, [employer]in [employer].   Allergies:  No Known Allergies  Metabolic Disorder Labs: No results found for: HGBA1C, MPG No results found for: PROLACTIN No results found for: CHOL, TRIG, HDL, CHOLHDL, VLDL, LDLCALC   Current Medications: Current Outpatient Prescriptions  Medication Sig Dispense Refill  . clonazePAM (KLONOPIN) 0.5 MG tablet Take 0.5 tablets (0.25 mg total) by mouth daily as needed for anxiety. 15 tablet 0  . venlafaxine XR (EFFEXOR XR) 75 MG 24 hr capsule Take 1 capsule (75 mg total) by mouth daily with breakfast. 30 capsule 1   No current facility-administered medications for this visit.     Neurologic: Headache: No Seizure: No Paresthesias:No  Musculoskeletal: Strength & Muscle Tone: within normal limits Gait & Station: normal Patient leans: N/A  Psychiatric Specialty Exam: Review of Systems  Constitutional: Positive for malaise/fatigue.  Musculoskeletal: Positive for neck pain.  Psychiatric/Behavioral: Positive for depression. The patient is nervous/anxious and has insomnia.   All other systems reviewed and are negative.   Blood pressure 116/82, pulse 67, temperature 99.3 F (37.4 C), temperature source Oral, height 5\' 8"  (1.727 m), weight 209 lb (94.8 kg), last menstrual period 11/11/2015.Body mass index is 31.78 kg/m.  General Appearance: Casual  Eye Contact:  Fair  Speech:  Clear and Coherent  Volume:  Decreased  Mood:  Anxious and Depressed  Affect:  Congruent and Depressed  Thought  Process:  Goal Directed  Orientation:  Full (Time, Place, and Person)  Thought Content:  WDL  Suicidal Thoughts:  No  Homicidal Thoughts:  No  Memory:  Immediate;   Fair Recent;   Fair Remote;   Fair  Judgement:  Intact  Insight:  Fair  Psychomotor Activity:  Normal  Concentration:  Concentration: Fair and Attention Span: Fair  Recall:  [employer]  Fund of Knowledge:Fair  Language: Fair  Akathisia:  No  Handed:  Right  AIMS (if indicated):    Assets:  Communication Skills Desire for Improvement Physical Health Social Support  ADL's:  Intact  Cognition: WNL  Sleep:  poor    Treatment Plan Summary: Medication management   Discussed with patient what the medications treatment risks benefits and alternatives.  Discontinue Seroquel as patient has already stopped Continue  Effexor XR 75 mg in the morning for anxiety symptoms. Patient is taking Klonopin on a when necessary basis and was given 15 day supply of the medication She will start therapy with Joni Reiningicole on a regular basis  Follow-up in 4 weeks  or earlier depending on her symptoms   More than 50% of the time spent in psychoeducation, counseling and coordination of care.    This note was generated in part or whole with voice recognition software. Voice regonition is usually quite accurate but there are transcription errors that can and very often do occur. I apologize for any typographical errors that were not detected and corrected.    Brandy Hale, MD 8/29/201710:25 AM

## 2015-12-24 ENCOUNTER — Ambulatory Visit: Payer: 59 | Admitting: Licensed Clinical Social Worker

## 2016-01-15 ENCOUNTER — Ambulatory Visit: Payer: 59 | Admitting: Psychiatry

## 2016-12-01 ENCOUNTER — Ambulatory Visit (INDEPENDENT_AMBULATORY_CARE_PROVIDER_SITE_OTHER): Payer: 59 | Admitting: Family Medicine

## 2016-12-01 ENCOUNTER — Encounter: Payer: Self-pay | Admitting: Family Medicine

## 2016-12-01 DIAGNOSIS — F411 Generalized anxiety disorder: Secondary | ICD-10-CM | POA: Diagnosis not present

## 2016-12-01 DIAGNOSIS — E669 Obesity, unspecified: Secondary | ICD-10-CM | POA: Insufficient documentation

## 2016-12-01 DIAGNOSIS — E6609 Other obesity due to excess calories: Secondary | ICD-10-CM | POA: Diagnosis not present

## 2016-12-01 MED ORDER — CLONAZEPAM 0.5 MG PO TABS: 0.5000 mg | ORAL_TABLET | Freq: Every day | ORAL | 0 refills | Status: DC | PRN

## 2016-12-01 MED ORDER — ESCITALOPRAM OXALATE 10 MG PO TABS: 10.0000 mg | ORAL_TABLET | Freq: Every day | ORAL | 1 refills | Status: DC

## 2016-12-01 NOTE — Patient Instructions (Signed)
Nice to see you. Congratulations on your twins. We'll taper you off of Cymbalta. Please start [name] for one week, then take Cymbalta 30 mg every other day for 1 week, then discontinue. If you have any increased anxiety, depression, jitteriness, shakiness, nausea, vomiting, or any new symptoms please let us know. Please work on diet and exercise as we discussed.

## 2016-12-01 NOTE — Progress Notes (Signed)
  Marikay AlarEric Providence Stivers, MD Phone: [phone]  Mary Fox is a 31 y.o. female who presents today for follow-up.  Anxiety: Patient notes she had twins about 12 weeks ago. Notes when she started IVF she came off of all her anxiety medications. She notes her anxiety has been increasing since having the babies. She notes she had a little postpartum depression and started herself back on Cymbalta. Her OB continued this. She notes the postpartum depression is gone and she is not depressed at all now. She is quite anxious and focuses a lot on how the babies are doing. She notes no SI or HI. Taking Klonopin one pill daily. She is not breast-feeding. She is interested in going back on medication for her anxiety.  Obesity: Patient notes BMI was elevated on health screening. She has been trying to focus on her diet and decrease her carb intake and increase fruits. Also working on portion control. She had preeclampsia while she was pregnant and has just now come off of the blood pressure medication. She had a C-section and this limited her ability to exercise though she is going to get back into the gym soon.  PMH: Former smoker   ROS see history of present illness  Objective  Physical Exam Vitals:   12/01/16 0945  BP: 110/80  Pulse: 72  Temp: 98 F (36.7 C)  SpO2: 98%    BP Readings from Last 3 Encounters:  12/01/16 110/80  12/15/15 116/82  12/02/15 116/74   Wt Readings from Last 3 Encounters:  12/01/16 217 lb 9.6 oz (98.7 kg)  12/15/15 209 lb (94.8 kg)  12/02/15 206 lb 6.4 oz (93.6 kg)    Physical Exam  Constitutional: No distress.  Cardiovascular: Normal rate, regular rhythm and normal heart sounds.   Pulmonary/Chest: Effort normal and breath sounds normal.  Musculoskeletal: She exhibits no edema.  Neurological: She is alert. Gait normal.  Skin: She is not diaphoretic.  Psychiatric:  Mood anxious, affect anxious     Assessment/Plan: Please see individual problem  list.  Generalized anxiety disorder Patient with fairly significant anxiety. Had been somewhat well controlled when she was on medications previously. Has worsened. Cymbalta has not helped with this since restarting this. She's been taking Klonopin once a day. Discussed options for medications. Patient noted she has tolerated Lexapro and it worked well in the past. We will taper her off of her Cymbalta as outlined in the AVS. Once she is off of the Cymbalta she will start on the Lexapro. I discussed this plan with her. We'll refill her Klonopin. We'll see her back in 8 weeks.  Obesity Discussed diet and exercise. Form for work filled out. We'll continue to monitor this at follow-up.   No orders of the defined types were placed in this encounter.   Meds ordered this encounter  Medications  . clonazePAM (KLONOPIN) 0.5 MG tablet    Sig: Take 1 tablet (0.5 mg total) by mouth daily as needed for anxiety.    Dispense:  30 tablet    Refill:  0  . escitalopram (LEXAPRO) 10 MG tablet    Sig: Take 1 tablet (10 mg total) by mouth daily.    Dispense:  90 tablet    Refill:  1   Marikay AlarEric Trea Latner, MD [facility]

## 2016-12-01 NOTE — Assessment & Plan Note (Signed)
Discussed diet and exercise. Form for work filled out. We'll continue to monitor this at follow-up.

## 2016-12-01 NOTE — Assessment & Plan Note (Addendum)
Patient with fairly significant anxiety. Had been somewhat well controlled when she was on medications previously. Has worsened. Cymbalta has not helped with this since restarting this. She's been taking Klonopin once a day. Discussed options for medications. Patient noted she has tolerated Lexapro and it worked well in the past. We will taper her off of her Cymbalta as outlined in the AVS. Once she is off of the Cymbalta she will start on the Lexapro. I discussed this plan with her. We'll refill her Klonopin. We'll see her back in 8 weeks.

## 2017-01-30 ENCOUNTER — Encounter: Payer: Self-pay | Admitting: Family Medicine

## 2017-01-30 ENCOUNTER — Ambulatory Visit (INDEPENDENT_AMBULATORY_CARE_PROVIDER_SITE_OTHER): Payer: 59 | Admitting: Family Medicine

## 2017-01-30 DIAGNOSIS — F411 Generalized anxiety disorder: Secondary | ICD-10-CM

## 2017-01-30 MED ORDER — ESCITALOPRAM OXALATE 10 MG PO TABS: 10.0000 mg | ORAL_TABLET | Freq: Every day | ORAL | 3 refills | Status: DC

## 2017-01-30 NOTE — Progress Notes (Signed)
  Marikay Alar, MD Phone: [phone]  Mary Fox is a 31 y.o. female who presents today for f/u.  Anxiety: Patient notes this is about 85% better. She's been on Lexapro for about 4 weeks. She has reduced her Klonopin usage to 3-4 times a week. No depression. No SI or HI. Notes her babies are doing quite well. She is not breast-feeding.   ROS see history of present illness  Objective  Physical Exam Vitals:   01/30/17 1358  BP: 114/80  Pulse: 65  Temp: 98.4 F (36.9 C)  SpO2: 97%    BP Readings from Last 3 Encounters:  01/30/17 114/80  12/01/16 110/80  06/12/15 104/66   Wt Readings from Last 3 Encounters:  01/30/17 223 lb 12.8 oz (101.5 kg)  12/01/16 217 lb 9.6 oz (98.7 kg)  06/12/15 196 lb (88.9 kg)    Physical Exam  Constitutional: No distress.  Cardiovascular: Normal rate, regular rhythm and normal heart sounds.   Pulmonary/Chest: Effort normal and breath sounds normal.  Skin: She is not diaphoretic.     Assessment/Plan: Please see individual problem list.  Generalized anxiety disorder Improved significantly. Continue Lexapro 10 mg daily. When necessary Klonopin. She'll contact us in about a month if she would like to consider going up on the Lexapro.   No orders of the defined types were placed in this encounter.   Meds ordered this encounter  Medications  . escitalopram (LEXAPRO) 10 MG tablet    Sig: Take 1 tablet (10 mg total) by mouth daily.    Dispense:  90 tablet    Refill:  3    Marikay Alar, MD [facility]

## 2017-01-30 NOTE — Assessment & Plan Note (Signed)
Improved significantly. Continue Lexapro 10 mg daily. When necessary Klonopin. She'll contact us in about a month if she would like to consider going up on the Lexapro.

## 2017-01-30 NOTE — Patient Instructions (Signed)
Nice to see you. I'm glad your anxiety is doing better. Please contact us in about a month if you do not feel it is continuing to do well.

## 2017-04-13 DIAGNOSIS — S39012A Strain of muscle, fascia and tendon of lower back, initial encounter: Secondary | ICD-10-CM | POA: Insufficient documentation

## 2017-05-29 ENCOUNTER — Telehealth: Payer: Self-pay | Admitting: *Deleted

## 2017-05-29 DIAGNOSIS — J302 Other seasonal allergic rhinitis: Secondary | ICD-10-CM

## 2017-05-29 NOTE — Telephone Encounter (Signed)
Copied from CRM [id number]. Topic: Referral - Request >> May 29, 2017 11:00 AM Crist InfanteHarrald, Kathy J wrote: Reason for CRM: pt would like an order from Dr Birdie SonsSonnenberg to get allergy testing done. General allergies. Pt doesn't need referral for an allergist, just the order.. pt works at [employer] and they do there.  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 So just an order is all she wants.

## 2017-05-29 NOTE — Telephone Encounter (Signed)
I will go ahead and place an order though she could try taking Claritin or Zyrtec as that may prevent her symptoms.

## 2017-05-29 NOTE — Telephone Encounter (Signed)
Copied from CRM [id number]. Topic: Appointment Scheduling - Scheduling Inquiry for Clinic >> May 29, 2017 11:11 AM Crist InfanteHarrald, Oddie Bottger J wrote: Reason for CRM: pt would like an appointment to see Dr Birdie SonsSonnenberg.  Pt states she needs to see for her meds. No appointments except hosp fu. Pt needs to see her pcp to refill meds. Thank you  Also no appointment until April 2019

## 2017-05-29 NOTE — Telephone Encounter (Signed)
Please find out if there is a reason she would like allergy testing. I do not order allergy testing and typically have patients see an allergist to determine if testing is necessary. If she is having allergy symptoms please find out what she has done for them so far. Please also see if she just needs an appointment for follow-up or if there is an acute change or other acute issue going on. If it is just for follow-up she can be scheduled in April. Thanks.

## 2017-05-29 NOTE — Telephone Encounter (Signed)
Patient states she experiences eye swelling every once in a while. She takes benadryl which does help.Last episode was last week and she states this happens a couple times a week for about a couple months now. Ok with referral if you do not want to order labs.

## 2017-05-29 NOTE — Telephone Encounter (Signed)
Copied from CRM #[id number]. Topic: Referral - Request >> May 29, 2017 11:00 AM Harrald, Kathy J wrote: Reason for CRM: pt would like an order from Dr Sonnenberg to get allergy testing done. General allergies. Pt doesn't need referral for an allergist, just the order.. pt works at [employer] and they do there.  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 So just an order is all she wants. 

## 2017-05-29 NOTE — Telephone Encounter (Signed)
See other message

## 2017-05-30 NOTE — Telephone Encounter (Signed)
Patient notified

## 2017-05-31 ENCOUNTER — Ambulatory Visit (INDEPENDENT_AMBULATORY_CARE_PROVIDER_SITE_OTHER): Payer: Managed Care, Other (non HMO) | Admitting: Family Medicine

## 2017-05-31 ENCOUNTER — Other Ambulatory Visit: Payer: Self-pay

## 2017-05-31 ENCOUNTER — Encounter: Payer: Self-pay | Admitting: Family Medicine

## 2017-05-31 DIAGNOSIS — T7840XA Allergy, unspecified, initial encounter: Secondary | ICD-10-CM

## 2017-05-31 DIAGNOSIS — F411 Generalized anxiety disorder: Secondary | ICD-10-CM | POA: Diagnosis not present

## 2017-05-31 HISTORY — DX: Allergy, unspecified, initial encounter: T78.40XA

## 2017-05-31 MED ORDER — ESCITALOPRAM OXALATE 20 MG PO TABS: 20.0000 mg | ORAL_TABLET | Freq: Every day | ORAL | 3 refills | Status: DC

## 2017-05-31 MED ORDER — BUSPIRONE HCL 5 MG PO TABS: 5.0000 mg | ORAL_TABLET | Freq: Two times a day (BID) | ORAL | 2 refills | Status: DC

## 2017-05-31 MED ORDER — CLONAZEPAM 0.5 MG PO TABS: 0.5000 mg | ORAL_TABLET | Freq: Every day | ORAL | 0 refills | Status: DC | PRN

## 2017-05-31 NOTE — Patient Instructions (Signed)
Nice to see you. We will add BuSpar to your regimen.  Please continue the Lexapro.  Be careful with the Klonopin as it could make you drowsy. Please try the Claritin for your possible allergy symptoms.  We will get you to see the allergist as well.  If you develop worsening allergy symptoms or you develop throat or mouth symptoms please be evaluated.

## 2017-05-31 NOTE — Assessment & Plan Note (Signed)
Worsened.  Will augment with BuSpar.  Discussed small risk of serotonin syndrome.  She will continue Lexapro.  Klonopin refill given.  Advised this could make her drowsy.  She will follow-up in 3 months.

## 2017-05-31 NOTE — Assessment & Plan Note (Signed)
Symptoms sound like an allergic reaction possibly to an environmental allergen.  She notes no new exposures.  Responds to Benadryl.  We will have her start on Claritin or Zyrtec daily.  Will refer her to an allergist and this has already been placed.  Given return precautions.

## 2017-05-31 NOTE — Progress Notes (Signed)
  Marikay AlarEric Janit Cutter, MD Phone: [phone]  Virgilio Bellingshley Decuir is a 32 y.o. female who presents today for follow-up.  Anxiety: Notes this is increased in the last month.  No specific triggers.  Not sleeping as well.  More irritable.  She increased her Lexapro about a month ago to 20 mg.  Has run out of Klonopin as well.  No depression.  Has seen a counselor in the past with little benefit.  Patient reports allergy symptoms where the right side of her face will be puffy and swell up the surrounding her right eye.  No vision changes.  No eye pain.  Some itchy watery eyes with it.  No congestion or sneezing.  No postnasal drip.  No new medications or foods.  She saw her dentist and they stated it was not issue.  Social History   Tobacco Use  Smoking Status Former Smoker  . Last attempt to quit: 11/05/2010  . Years since quitting: 6.5  Smokeless Tobacco Never Used     ROS see history of present illness  Objective  Physical Exam Vitals:   05/31/17 1309  BP: 120/64  Pulse: 84  Temp: 98.2 F (36.8 C)  SpO2: 99%    BP Readings from Last 3 Encounters:  05/31/17 120/64  01/30/17 114/80  12/01/16 110/80   Wt Readings from Last 3 Encounters:  05/31/17 218 lb 6.4 oz (99.1 kg)  01/30/17 223 lb 12.8 oz (101.5 kg)  12/01/16 217 lb 9.6 oz (98.7 kg)    Physical Exam  Constitutional: No distress.  HENT:  Head: Normocephalic and atraumatic.  Mouth/Throat: Oropharynx is clear and moist. No oropharyngeal exudate.  No facial pain or tenderness  Eyes: Conjunctivae are normal. Pupils are equal, round, and reactive to light.  Cardiovascular: Normal rate, regular rhythm and normal heart sounds.  Pulmonary/Chest: Effort normal and breath sounds normal.  Musculoskeletal: She exhibits no edema.  Neurological: She is alert. Gait normal.  Skin: Skin is warm and dry. She is not diaphoretic.     Assessment/Plan: Please see individual problem list.  Generalized anxiety disorder Worsened.  Will  augment with BuSpar.  Discussed small risk of serotonin syndrome.  She will continue Lexapro.  Klonopin refill given.  Advised this could make her drowsy.  She will follow-up in 3 months.  Allergic reaction Symptoms sound like an allergic reaction possibly to an environmental allergen.  She notes no new exposures.  Responds to Benadryl.  We will have her start on Claritin or Zyrtec daily.  Will refer her to an allergist and this has already been placed.  Given return precautions.   No orders of the defined types were placed in this encounter.   Meds ordered this encounter  Medications  . busPIRone (BUSPAR) 5 MG tablet    Sig: Take 1 tablet (5 mg total) by mouth 2 (two) times daily.    Dispense:  60 tablet    Refill:  2  . escitalopram (LEXAPRO) 20 MG tablet    Sig: Take 1 tablet (20 mg total) by mouth daily.    Dispense:  90 tablet    Refill:  3  . clonazePAM (KLONOPIN) 0.5 MG tablet    Sig: Take 1 tablet (0.5 mg total) by mouth daily as needed for anxiety.    Dispense:  30 tablet    Refill:  0   Health maintenance: Reports Pap smear done last year.  Marikay AlarEric Tully Mcinturff, MD [facility]

## 2017-07-12 ENCOUNTER — Other Ambulatory Visit: Payer: Self-pay | Admitting: Family Medicine

## 2017-07-12 ENCOUNTER — Encounter: Payer: Self-pay | Admitting: Family Medicine

## 2017-07-13 ENCOUNTER — Ambulatory Visit: Payer: Managed Care, Other (non HMO) | Admitting: Allergy & Immunology

## 2017-07-13 MED ORDER — BUSPIRONE HCL 5 MG PO TABS: 5.0000 mg | ORAL_TABLET | Freq: Two times a day (BID) | ORAL | 2 refills | Status: DC

## 2017-07-13 MED ORDER — CLONAZEPAM 0.5 MG PO TABS: 0.5000 mg | ORAL_TABLET | Freq: Every day | ORAL | 0 refills | Status: DC | PRN

## 2017-07-13 NOTE — Telephone Encounter (Signed)
Last OV 05/31/17 last filled 05/31/17 30 0rf

## 2017-08-08 ENCOUNTER — Other Ambulatory Visit: Payer: Self-pay | Admitting: Family Medicine

## 2017-08-28 ENCOUNTER — Ambulatory Visit: Payer: Managed Care, Other (non HMO) | Admitting: Family Medicine

## 2017-11-02 ENCOUNTER — Other Ambulatory Visit: Payer: Self-pay | Admitting: Family Medicine

## 2017-12-05 ENCOUNTER — Other Ambulatory Visit: Payer: Self-pay | Admitting: Family Medicine

## 2017-12-05 MED ORDER — CLONAZEPAM 0.5 MG PO TABS: 0.5000 mg | ORAL_TABLET | Freq: Every day | ORAL | 0 refills | Status: DC | PRN

## 2017-12-05 NOTE — Telephone Encounter (Signed)
Last OV 05/31/17 last filled 07/13/17 30 0rf

## 2017-12-05 NOTE — Telephone Encounter (Signed)
Refill sent to pharmacy.  Controlled substance database reviewed. 

## 2017-12-07 ENCOUNTER — Ambulatory Visit (INDEPENDENT_AMBULATORY_CARE_PROVIDER_SITE_OTHER): Payer: Managed Care, Other (non HMO) | Admitting: Family Medicine

## 2017-12-07 ENCOUNTER — Encounter: Payer: Self-pay | Admitting: Family Medicine

## 2017-12-07 DIAGNOSIS — F411 Generalized anxiety disorder: Secondary | ICD-10-CM

## 2017-12-07 DIAGNOSIS — F32A Depression, unspecified: Secondary | ICD-10-CM | POA: Insufficient documentation

## 2017-12-07 DIAGNOSIS — F321 Major depressive disorder, single episode, moderate: Secondary | ICD-10-CM | POA: Diagnosis not present

## 2017-12-07 NOTE — Patient Instructions (Signed)
Nice to see you. We will contact our clinical pharmacist regarding transitioning from Lexapro to Cymbalta. We will contact you once we hear from her. If you develop thoughts of harming yourself please go to the emergency room.

## 2017-12-07 NOTE — Progress Notes (Signed)
  Mary AlarEric Blima Jaimes, MD Phone: [phone]  Mary Fox is a 32 y.o. female who presents today for f/u.  CC: depression, anxiety  Anxiety/depression: Has been on Lexapro, BuSpar, and Klonopin.  Has been taking Klonopin just about daily for the last 2 weeks.  No SI.  The Klonopin occasionally makes her drowsy though she does not drive with this.  No alcohol use.  She feels like she is on edge all the time. Work is stressful, though no other life changes.  In the past Cymbalta has been most helpful.  Social History   Tobacco Use  Smoking Status Former Smoker  . Last attempt to quit: 11/05/2010  . Years since quitting: 7.0  Smokeless Tobacco Never Used     ROS see history of present illness  Objective  Physical Exam Vitals:   12/07/17 0818  BP: 118/78  Pulse: (!) 54  Temp: (!) 97.4 F (36.3 C)  SpO2: 96%    BP Readings from Last 3 Encounters:  12/07/17 118/78  05/31/17 120/64  01/30/17 114/80   Wt Readings from Last 3 Encounters:  12/07/17 215 lb 9.6 oz (97.8 kg)  05/31/17 218 lb 6.4 oz (99.1 kg)  01/30/17 223 lb 12.8 oz (101.5 kg)    Physical Exam  Constitutional: No distress.  Cardiovascular: Normal rate, regular rhythm and normal heart sounds.  Pulmonary/Chest: Effort normal and breath sounds normal.  Neurological: She is alert.  Skin: Skin is warm and dry. She is not diaphoretic.     Assessment/Plan: Please see individual problem list.  Depression, major, single episode, moderate (HCC) This is worsened.  We will transition to Cymbalta and I will check with our clinical pharmacist on the best way to make this transition from Lexapro.  Patient reports she will continue BuSpar and clonazepam.  Given return precautions.  Generalized anxiety disorder Worsened.  We are going to transition to Cymbalta as that has been more beneficial in the past.  We will check with our clinical pharmacist on the best way to make this transition.   No orders of the defined types  were placed in this encounter.   No orders of the defined types were placed in this encounter.    Mary AlarEric Avner Stroder, MD [facility]

## 2017-12-07 NOTE — Assessment & Plan Note (Signed)
This is worsened.  We will transition to Cymbalta and I will check with our clinical pharmacist on the best way to make this transition from Lexapro.  Patient reports she will continue BuSpar and clonazepam.  Given return precautions.

## 2017-12-07 NOTE — Assessment & Plan Note (Signed)
Worsened.  We are going to transition to Cymbalta as that has been more beneficial in the past.  We will check with our clinical pharmacist on the best way to make this transition.

## 2017-12-08 ENCOUNTER — Telehealth: Payer: Self-pay | Admitting: Family Medicine

## 2017-12-08 MED ORDER — DULOXETINE HCL 30 MG PO CPEP: 30.0000 mg | ORAL_CAPSULE | Freq: Every day | ORAL | 3 refills | Status: DC

## 2017-12-08 MED ORDER — ESCITALOPRAM OXALATE 10 MG PO TABS: 10.0000 mg | ORAL_TABLET | Freq: Every day | ORAL | 0 refills | Status: DC

## 2017-12-08 NOTE — Telephone Encounter (Signed)
Please let the patient know that I heard back from the clinical pharmacist.  The recommendation was to decrease her Lexapro to 10 mg daily for 1 week.  She would then stop the Lexapro and start on Cymbalta 30 mg at that time.  After 1 week of Cymbalta 30 mg we could increase her to Cymbalta 60 mg.  [facility] [place] [facility] [place].  If she has any questions please let me know.  Thanks.

## 2017-12-08 NOTE — Telephone Encounter (Signed)
Left message to return call, ok for pec to speak to patient about message below 

## 2017-12-08 NOTE — Telephone Encounter (Signed)
-----   Message from Lourena SimmondsCatherine E Travis, [facility] sent at 12/07/2017 10:29 AM EDT ----- Elvina SidleHey!   Since I was here, I got Pete's thoughts. He said he wouldn't be uncomfortable only doing ~1 week of Lexapro, then stopping Lexapro and starting Cymbalta. It looks like she might have been on 60 mg previously? I'd probably prescribe her the 30 mg, take 30 mg for ~ 1 week while the remaining Lexapro washes out, then increase Cymbalta to 60 mg. So:  - Week 1 - Decrease to Lexapro 10 mg - Week 2 - Stop Lexapro, start Cymbalta 30 mg  - Week 3 and beyond - increase to Cymbalta 60 mg   Catie   ----- Message ----- From: Glori LuisSonnenberg, Manisha Cancel G, MD Sent: 12/07/2017   9:24 AM EDT To: Lourena Simmondsatherine E Travis, RPH  Hi Catie,   I have another question about changing between SSRIs. I would like to transition this patient from lexapro to cymbalta. Would it be ok to decrease to lexapro 10 mg for 2-4 weeks and start on the cymbalta at the same time? Or is there a better way to do this? Thanks for your help.   Minerva AreolaEric

## 2017-12-25 ENCOUNTER — Encounter: Payer: Self-pay | Admitting: Family

## 2017-12-25 ENCOUNTER — Encounter: Payer: Self-pay | Admitting: *Deleted

## 2017-12-25 ENCOUNTER — Encounter: Payer: Self-pay | Admitting: Family Medicine

## 2017-12-25 NOTE — Telephone Encounter (Signed)
Mychart message sent.

## 2017-12-25 NOTE — Telephone Encounter (Signed)
Left voice mail for patient to call.

## 2017-12-27 ENCOUNTER — Other Ambulatory Visit: Payer: Self-pay | Admitting: Family Medicine

## 2017-12-27 ENCOUNTER — Telehealth: Payer: Self-pay | Admitting: Family

## 2017-12-27 NOTE — Telephone Encounter (Signed)
    I looked up patient on [place] Controlled Substances Reporting System and saw no activity that raised concern of inappropriate use.   Spoke with pharmacy and she gets a two time fill of 30 day supply and then they require 90 day supply.   Cash price is $18.99 per month/    Not is not taking klonopin everyday.

## 2017-12-27 NOTE — Telephone Encounter (Signed)
Spoke with Mary Fox at [employer] is requiring patient get a  90 day supply due to she has [employer].   She has already used her 2 retail pharmacy fills . She can only get a 90 day supply at pharmacy or either through [employer].  Script has been at pharmacy since 12/05/17 on hold.   Spoke with patient and she only has 1 clonazepam left . Patient Please advise.   Patient aware Dr Birdie Sons out of office.

## 2017-12-27 NOTE — Telephone Encounter (Signed)
Call pt and ensure she has received mychart message  You can make her a f/u appt with De Nurse

## 2017-12-29 NOTE — Telephone Encounter (Signed)
Follow up with patient , confirmed she did receive mychart message

## 2017-12-29 NOTE — Telephone Encounter (Signed)
She has read mychart 12/27/17

## 2018-01-12 ENCOUNTER — Other Ambulatory Visit: Payer: Self-pay | Admitting: Family Medicine

## 2018-02-06 ENCOUNTER — Encounter: Payer: Self-pay | Admitting: Family Medicine

## 2018-02-06 ENCOUNTER — Ambulatory Visit (INDEPENDENT_AMBULATORY_CARE_PROVIDER_SITE_OTHER): Payer: Managed Care, Other (non HMO) | Admitting: Family Medicine

## 2018-02-06 DIAGNOSIS — F321 Major depressive disorder, single episode, moderate: Secondary | ICD-10-CM

## 2018-02-06 MED ORDER — CLONAZEPAM 0.5 MG PO TABS: 0.5000 mg | ORAL_TABLET | Freq: Every day | ORAL | 0 refills | Status: DC | PRN

## 2018-02-06 NOTE — Assessment & Plan Note (Signed)
Patient notes she would prefer to go up on the Cymbalta and then taper down on the Lexapro.  I will check with our clinical pharmacist to see what kind of tapering schedule she will need to come off of the Lexapro with the higher dose of Cymbalta.  Discussed risk of serotonin syndrome.  Clonazepam refilled.  Controlled substance database reviewed.

## 2018-02-06 NOTE — Progress Notes (Signed)
  Marikay Alar, MD Phone: [phone]  Mary Fox is a 32 y.o. female who presents today for f/u.  CC: depression/anxiety  Depression/anxiety: Patient was supposed to taper off of Lexapro though her anxiety and depression worsened.  She went back up to 20 mg of Lexapro daily.  She continued to take the [name] after starting on that.  She takes clonazepam nightly.  She notes she feels like she is on edge.  No suicidal ideation.  She has been able to do some therapy through work though is not been terribly beneficial.  Depression screen [facility] 2/9 02/06/2018 02/06/2018 12/07/2017 04/02/2015 11/24/2014  Decreased Interest 1 1 2  0 0  Down, Depressed, Hopeless 1 1 1  0 0  PHQ - 2 Score 2 2 3  0 0  Altered sleeping 2 - 2 - -  Tired, decreased energy 2 - 2 - -  Change in appetite 2 - 2 - -  Feeling bad or failure about yourself  3 - 2 - -  Trouble concentrating 3 - 2 - -  Moving slowly or fidgety/restless 2 - 2 - -  Suicidal thoughts 0 - 0 - -  PHQ-9 Score 16 - 15 - -  Difficult doing work/chores Very difficult - Somewhat difficult - -     Social History   Tobacco Use  Smoking Status Former Smoker  . Last attempt to quit: 11/05/2010  . Years since quitting: 7.2  Smokeless Tobacco Never Used     ROS see history of present illness  Objective  Physical Exam Vitals:   02/06/18 1331  BP: 130/78  Pulse: 86  Temp: 98.4 F (36.9 C)  SpO2: 97%    BP Readings from Last 3 Encounters:  02/06/18 130/78  12/07/17 118/78  05/31/17 120/64   Wt Readings from Last 3 Encounters:  02/06/18 215 lb 6.4 oz (97.7 kg)  12/07/17 215 lb 9.6 oz (97.8 kg)  05/31/17 218 lb 6.4 oz (99.1 kg)    Physical Exam  Constitutional: No distress.  Cardiovascular: Normal rate, regular rhythm and normal heart sounds.  Pulmonary/Chest: Effort normal and breath sounds normal.  Neurological: She is alert.  Skin: Skin is warm and dry. She is not diaphoretic.     Assessment/Plan: Please see  individual problem list.  Depression, major, single episode, moderate (HCC) Patient notes she would prefer to go up on the Cymbalta and then taper down on the Lexapro.  I will check with our clinical pharmacist to see what kind of tapering schedule she will need to come off of the Lexapro with the higher dose of Cymbalta.  Discussed risk of serotonin syndrome.  Clonazepam refilled.  Controlled substance database reviewed.    No orders of the defined types were placed in this encounter.   Meds ordered this encounter  Medications  . clonazePAM (KLONOPIN) 0.5 MG tablet    Sig: Take 1 tablet (0.5 mg total) by mouth daily as needed for anxiety.    Dispense:  90 tablet    Refill:  0     Marikay Alar, MD [facility]

## 2018-02-06 NOTE — Patient Instructions (Signed)
Nice to see you. I will check with our clinical pharmacist regarding her medication.  We will contact you when we hear back.

## 2018-02-07 ENCOUNTER — Telehealth: Payer: Self-pay | Admitting: Family Medicine

## 2018-02-07 NOTE — Telephone Encounter (Signed)
-----   Message from Lourena Simmonds, [facility] sent at 02/06/2018  4:53 PM EDT ----- Dr. Birdie Sons,   I agree, I think that would be a fine tapering strategy. If she has increased anxiety, you can extend the tapers to 2 week intervals, if need be.   Catie  ----- Message ----- From: Glori Luis, MD Sent: 02/06/2018   2:04 PM EDT To: Lourena Simmonds, [facility]  Hi Catie,  This patient is on Lexapro 20 mg daily.  I previously attempted to taper her down and off of this and start her on Cymbalta though her anxiety and depression worsened with Lexapro 10 mg daily.  She is currently taking Lexapro 20 mg daily and [name].  I was wondering what the best way to get her off of the Lexapro would be.  I was thinking potentially of increasing her Cymbalta 60 mg daily and decreasing her Lexapro 10 mg daily for 1 week and then decreasing to 5 mg daily for 1 week and then discontinuing the Lexapro.  Please let me know if this would be adequate or if you think another tapering regimen would be better.  Thanks.  Mary Fox.

## 2018-02-07 NOTE — Telephone Encounter (Signed)
Please let the patient know I heard back from the clinical pharmacist.  We recommend decreasing Lexapro to 10 mg daily for 1 week.  The patient can increase her Cymbalta to 60 mg daily at the same time.  After she has been on the 10 mg dose of Lexapro for 1 week she could decrease to Lexapro 5 mg daily for 1 week and then discontinue the Lexapro.  Please let the patient know this.  Please see if she has Lexapro 10 mg tablets at home and if she has Cymbalta 30 mg capsules at home.  If she needs refills to complete this taper please let me know.

## 2018-02-08 ENCOUNTER — Other Ambulatory Visit: Payer: Self-pay | Admitting: Family Medicine

## 2018-02-08 MED ORDER — DULOXETINE HCL 60 MG PO CPEP: 60.0000 mg | ORAL_CAPSULE | Freq: Every day | ORAL | 0 refills | Status: DC

## 2018-02-08 MED ORDER — ESCITALOPRAM OXALATE 10 MG PO TABS: 10.0000 mg | ORAL_TABLET | Freq: Every day | ORAL | 0 refills | Status: DC

## 2018-02-08 NOTE — Telephone Encounter (Signed)
Called and spoke with pt. Pt advised and voiced understanding.   Pt stated that she did need refills on lexapro 10 Mg and Cymbalta 60 MG. Pt wanted the Cymbalta for a 90 day supply due to her insurance. Pt asked for Rx to go to [facility].   Rx has been sent electronically.

## 2018-02-08 NOTE — Addendum Note (Signed)
Addended by: Gracelyn Nurse on: 02/08/2018 09:44 AM   Modules accepted: Orders

## 2018-05-21 ENCOUNTER — Other Ambulatory Visit: Payer: Self-pay | Admitting: Family Medicine

## 2018-05-21 NOTE — Telephone Encounter (Signed)
Controlled substance database reviewed.  Refill sent to pharmacy.  Patient needs to schedule follow-up for sometime in the next several months.  Please contact her to get this scheduled.  Thanks.

## 2018-05-21 NOTE — Telephone Encounter (Signed)
Refilled: 02/06/2018 Last OV: 02/06/2018 Next OV: not scheduled

## 2018-05-24 NOTE — Telephone Encounter (Signed)
LMTCB. PEC may speak with pt.  

## 2018-07-02 ENCOUNTER — Other Ambulatory Visit: Payer: Self-pay | Admitting: Family Medicine

## 2018-07-23 ENCOUNTER — Telehealth: Payer: Self-pay | Admitting: Family Medicine

## 2018-07-23 ENCOUNTER — Encounter: Payer: Self-pay | Admitting: Family Medicine

## 2018-07-23 ENCOUNTER — Other Ambulatory Visit: Payer: Self-pay

## 2018-07-23 ENCOUNTER — Ambulatory Visit (INDEPENDENT_AMBULATORY_CARE_PROVIDER_SITE_OTHER): Payer: Managed Care, Other (non HMO) | Admitting: Family Medicine

## 2018-07-23 DIAGNOSIS — F411 Generalized anxiety disorder: Secondary | ICD-10-CM | POA: Diagnosis not present

## 2018-07-23 DIAGNOSIS — F321 Major depressive disorder, single episode, moderate: Secondary | ICD-10-CM | POA: Diagnosis not present

## 2018-07-23 MED ORDER — DULOXETINE HCL 60 MG PO CPEP: 60.0000 mg | ORAL_CAPSULE | Freq: Every day | ORAL | 1 refills | Status: DC

## 2018-07-23 MED ORDER — CLONAZEPAM 0.5 MG PO TABS: ORAL_TABLET | ORAL | 0 refills | Status: DC

## 2018-07-23 NOTE — Progress Notes (Signed)
Virtual Visit via Video Note  This visit type was conducted due to national recommendations for restrictions regarding the COVID-19 pandemic (e.g. social distancing).  This format is felt to be most appropriate for this patient at this time.  All issues noted in this document were discussed and addressed.  No physical exam was performed (except for noted visual exam findings with Video Visits).   I connected with Mary Fox on 07/23/18 at  2:45 PM EDT by a video enabled telemedicine application and verified that I am speaking with the correct person using two identifiers. Location patient: home Location provider: work Persons participating in the virtual visit: patient, provider  I discussed the limitations of evaluation and management by telemedicine and the availability of in person appointments. The patient expressed understanding and agreed to proceed.  Reason for follow-up: Anxiety/depression  HPI: Anxiety/depression: Patient notes no depression.  She notes her anxiety is well controlled as long as she is taking the Cymbalta daily and the Klonopin nightly.  She notes no drowsiness with the Klonopin.  No alcohol intake.  No SI.  She does note she ran out of the Klonopin for about a week and did have increased anxiety at that time.  She walks daily for exercise.  She is no longer binge eating or emotional eating.  Notes her weight has been stable.   ROS: See pertinent positives and negatives per HPI.  Past Medical History:  Diagnosis Date  . Anxiety   . Depression     Past Surgical History:  Procedure Laterality Date  . CHOLECYSTECTOMY  07 31 2012  . GASTRIC BYPASS      Family History  Problem Relation Age of Onset  . Alcohol abuse Mother   . Bipolar disorder Sister   . Alcohol abuse Sister   . Anxiety disorder Sister   . Depression Sister   . Drug abuse Sister   . Diabetes Neg Hx     SOCIAL HX: Former smoker.   Current Outpatient Medications:  .  [START ON 08/04/2018]  clonazePAM (KLONOPIN) 0.5 MG tablet, TAKE 1 TABLET BY MOUTH EVERY DAY AS NEEDED FOR ANXIETY, Disp: 30 tablet, Rfl: 0 .  DULoxetine (CYMBALTA) 60 MG capsule, Take 1 capsule (60 mg total) by mouth daily., Disp: 90 capsule, Rfl: 1  EXAM:  VITALS per patient if applicable: None.  GENERAL: alert, oriented, appears well and in no acute distress  HEENT: atraumatic, conjunttiva clear, no obvious abnormalities on inspection of external nose and ears  NECK: normal movements of the head and neck  LUNGS: on inspection no signs of respiratory distress, breathing rate appears normal, no obvious gross SOB, gasping or wheezing  CV: no obvious cyanosis  MS: moves all visible extremities without noticeable abnormality  PSYCH/NEURO: pleasant and cooperative, no obvious depression or anxiety, speech and thought processing grossly intact  ASSESSMENT AND PLAN:  Discussed the following assessment and plan:  Depression, major, single episode, moderate (HCC)  Generalized anxiety disorder  Depression, major, single episode, moderate (HCC) Asymptomatic.  Continue Cymbalta.  Generalized anxiety disorder Well-controlled on combination of Cymbalta and Klonopin.  She will continue these medications.  Refill sent to the pharmacy.  Controlled substance database reviewed.    I discussed the assessment and treatment plan with the patient. The patient was provided an opportunity to ask questions and all were answered. The patient agreed with the plan and demonstrated an understanding of the instructions.   The patient was advised to call back or seek an in-person evaluation  if the symptoms worsen or if the condition fails to improve as anticipated.  I provided 9 minutes of non-face-to-face time during this encounter.   Marikay Alar, MD

## 2018-07-23 NOTE — Assessment & Plan Note (Signed)
Asymptomatic.  Continue Cymbalta. 

## 2018-07-23 NOTE — Assessment & Plan Note (Signed)
Well-controlled on combination of Cymbalta and Klonopin.  She will continue these medications.  Refill sent to the pharmacy.  Controlled substance database reviewed.

## 2018-07-23 NOTE — Telephone Encounter (Signed)
Please contact the patient and get her set up for 108-month follow-up.  Thanks.

## 2018-07-24 NOTE — Telephone Encounter (Signed)
Called and spoke with patient. Pt has been scheduled for 6 month follow up in October 2020

## 2018-08-05 ENCOUNTER — Other Ambulatory Visit: Payer: Self-pay | Admitting: Family Medicine

## 2018-08-06 NOTE — Telephone Encounter (Signed)
Last OV 07/23/2018  Last refilled 08/04/2018 disp 30 with no refills   Next OV   Sent to PCP for approval

## 2018-09-09 ENCOUNTER — Other Ambulatory Visit: Payer: Self-pay | Admitting: Family Medicine

## 2018-09-11 NOTE — Telephone Encounter (Signed)
Refilled: 08/04/2018 Last OV: 07/23/2018 Next OV: 01/21/2019

## 2018-10-21 ENCOUNTER — Other Ambulatory Visit: Payer: Self-pay | Admitting: Family Medicine

## 2018-10-23 NOTE — Telephone Encounter (Signed)
Refilled: 09/13/2018 Last OV: 07/23/2018 Next OV: 01/21/2019

## 2018-10-24 ENCOUNTER — Other Ambulatory Visit: Payer: Self-pay

## 2018-10-24 NOTE — Telephone Encounter (Signed)
Error.  Nkosi Cortright,cma  

## 2018-10-27 ENCOUNTER — Other Ambulatory Visit: Payer: Self-pay | Admitting: Family Medicine

## 2018-10-29 ENCOUNTER — Encounter: Payer: Self-pay | Admitting: Family Medicine

## 2018-10-29 MED ORDER — CLONAZEPAM 0.5 MG PO TABS: ORAL_TABLET | ORAL | 0 refills | Status: DC

## 2018-11-28 ENCOUNTER — Other Ambulatory Visit: Payer: Self-pay | Admitting: Family Medicine

## 2018-11-30 ENCOUNTER — Telehealth: Payer: Self-pay | Admitting: Family Medicine

## 2018-11-30 MED ORDER — CLONAZEPAM 0.5 MG PO TABS: ORAL_TABLET | ORAL | 0 refills | Status: DC

## 2018-11-30 NOTE — Telephone Encounter (Signed)
Last OV 4/20 last fill 08/02/18.

## 2018-11-30 NOTE — Telephone Encounter (Signed)
Sent to pharmacy 

## 2018-11-30 NOTE — Telephone Encounter (Signed)
Medication Refill - Medication: clonazePAM (KLONOPIN) 0.5 MG tablet (Patient is completely out of medication.)    Has the patient contacted their pharmacy? Yes (Agent: If no, request that the patient contact the pharmacy for the refill.) (Agent: If yes, when and what did the pharmacy advise?)Contact pcp  Preferred Pharmacy (with phone number or street name): [id number] - [address], [place] [phone] (Phone) [phone] (Fax     Agent: Please be advised that RX refills may take up to 3 business days. We ask that you follow-up with your pharmacy.

## 2018-12-24 ENCOUNTER — Other Ambulatory Visit: Payer: Self-pay | Admitting: Family Medicine

## 2019-01-18 ENCOUNTER — Telehealth: Payer: Self-pay | Admitting: *Deleted

## 2019-01-18 NOTE — Telephone Encounter (Signed)
Sent in error.  Please disregard

## 2019-01-18 NOTE — Telephone Encounter (Signed)
Please place future orders for lab appt.  

## 2019-01-21 ENCOUNTER — Ambulatory Visit (INDEPENDENT_AMBULATORY_CARE_PROVIDER_SITE_OTHER): Payer: Managed Care, Other (non HMO) | Admitting: Family Medicine

## 2019-01-21 ENCOUNTER — Other Ambulatory Visit: Payer: Self-pay

## 2019-01-21 ENCOUNTER — Encounter: Payer: Self-pay | Admitting: Family Medicine

## 2019-01-21 DIAGNOSIS — F321 Major depressive disorder, single episode, moderate: Secondary | ICD-10-CM | POA: Diagnosis not present

## 2019-01-21 DIAGNOSIS — F411 Generalized anxiety disorder: Secondary | ICD-10-CM

## 2019-01-21 MED ORDER — DULOXETINE HCL 60 MG PO CPEP: 60.0000 mg | ORAL_CAPSULE | Freq: Every day | ORAL | 3 refills | Status: DC

## 2019-01-21 MED ORDER — CLONAZEPAM 0.5 MG PO TABS: ORAL_TABLET | ORAL | 2 refills | Status: DC

## 2019-01-21 NOTE — Assessment & Plan Note (Signed)
Relatively stable.  She will continue Cymbalta and clonazepam.  She will monitor symptoms and if she has any worsening symptoms she will let us know.

## 2019-01-21 NOTE — Assessment & Plan Note (Signed)
Relatively stable.  She will continue Cymbalta.  Discussed continuing with walking and exercise.  If she has more frequent issues with becoming upset and emotional or if depression or anxiety worsen she will let us know.

## 2019-01-21 NOTE — Progress Notes (Signed)
Virtual Visit via video Note  This visit type was conducted due to national recommendations for restrictions regarding the COVID-19 pandemic (e.g. social distancing).  This format is felt to be most appropriate for this patient at this time.  All issues noted in this document were discussed and addressed.  No physical exam was performed (except for noted visual exam findings with Video Visits).   I connected with Mary Fox today at  8:00 AM EDT by a video enabled telemedicine application and verified that I am speaking with the correct person using two identifiers. Location patient: car  Location provider: work Persons participating in the virtual visit: patient, provider  I discussed the limitations, risks, security and privacy concerns of performing an evaluation and management service by telephone and the availability of in person appointments. I also discussed with the patient that there may be a patient responsible charge related to this service. The patient expressed understanding and agreed to proceed.   Reason for visit: follow-up  HPI: Anxiety/depression: Taking Cymbalta and clonazepam.  She notes these are quite helpful in combination.  No SI.  She does note some increased stress.  Notes things are weighing on her more.  She will occasionally become very upset and emotional and feel as though she is ineffective as a parent.  She is in school and working full-time and has 2 toddlers.  She does note she started to walk and that does help decrease her stress level.  Patient reports she has a Pap smear scheduled for tomorrow.  She reports she had a Tdap during her pregnancy in 2018.  She will get her flu vaccine through work.   ROS: See pertinent positives and negatives per HPI.  Past Medical History:  Diagnosis Date  . Allergic reaction 05/31/2017  . Anxiety   . Depression     Past Surgical History:  Procedure Laterality Date  . CHOLECYSTECTOMY  07 31 2012  . GASTRIC BYPASS       Family History  Problem Relation Age of Onset  . Alcohol abuse Mother   . Bipolar disorder Sister   . Alcohol abuse Sister   . Anxiety disorder Sister   . Depression Sister   . Drug abuse Sister   . Diabetes Neg Hx     SOCIAL HX: Former smoker.   Current Outpatient Medications:  .  clonazePAM (KLONOPIN) 0.5 MG tablet, TAKE 1 TABLET BY MOUTH EVERY DAY AS NEEDED FOR ANXIETY, Disp: 30 tablet, Rfl: 2 .  DULoxetine (CYMBALTA) 60 MG capsule, Take 1 capsule (60 mg total) by mouth daily., Disp: 90 capsule, Rfl: 3  EXAM:  VITALS per patient if applicable: None.  GENERAL: alert, oriented, appears well and in no acute distress  HEENT: atraumatic, conjunttiva clear, no obvious abnormalities on inspection of external nose and ears  NECK: normal movements of the head and neck  LUNGS: on inspection no signs of respiratory distress, breathing rate appears normal, no obvious gross SOB, gasping or wheezing  CV: no obvious cyanosis  MS: moves all visible extremities without noticeable abnormality  PSYCH/NEURO: pleasant and cooperative, no obvious depression or anxiety, speech and thought processing grossly intact  ASSESSMENT AND PLAN:  Discussed the following assessment and plan:  Depression, major, single episode, moderate (HCC) Relatively stable.  She will continue Cymbalta.  Discussed continuing with walking and exercise.  If she has more frequent issues with becoming upset and emotional or if depression or anxiety worsen she will let us know.  Generalized anxiety disorder  Relatively stable.  She will continue Cymbalta and clonazepam.  She will monitor symptoms and if she has any worsening symptoms she will let us know.    I discussed the assessment and treatment plan with the patient. The patient was provided an opportunity to ask questions and all were answered. The patient agreed with the plan and demonstrated an understanding of the instructions.   The patient was advised  to call back or seek an in-person evaluation if the symptoms worsen or if the condition fails to improve as anticipated.   Tommi Rumps, MD

## 2019-01-23 ENCOUNTER — Encounter: Payer: Self-pay | Admitting: Obstetrics and Gynecology

## 2019-01-23 ENCOUNTER — Ambulatory Visit (INDEPENDENT_AMBULATORY_CARE_PROVIDER_SITE_OTHER): Payer: Managed Care, Other (non HMO) | Admitting: Obstetrics and Gynecology

## 2019-01-23 ENCOUNTER — Other Ambulatory Visit: Payer: Self-pay

## 2019-01-23 VITALS — BP 120/82 | HR 63 | Ht 68.0 in | Wt 221.0 lb

## 2019-01-23 DIAGNOSIS — Z124 Encounter for screening for malignant neoplasm of cervix: Secondary | ICD-10-CM

## 2019-01-23 DIAGNOSIS — Z01419 Encounter for gynecological examination (general) (routine) without abnormal findings: Secondary | ICD-10-CM | POA: Diagnosis not present

## 2019-01-23 DIAGNOSIS — Z0000 Encounter for general adult medical examination without abnormal findings: Secondary | ICD-10-CM

## 2019-01-23 DIAGNOSIS — F520 Hypoactive sexual desire disorder: Secondary | ICD-10-CM

## 2019-01-23 DIAGNOSIS — Z13220 Encounter for screening for lipoid disorders: Secondary | ICD-10-CM

## 2019-01-23 DIAGNOSIS — Z131 Encounter for screening for diabetes mellitus: Secondary | ICD-10-CM

## 2019-01-23 DIAGNOSIS — Z130 Encounter for screening for diseases of the blood and blood-forming organs and certain disorders involving the immune mechanism: Secondary | ICD-10-CM

## 2019-01-23 DIAGNOSIS — Z1239 Encounter for other screening for malignant neoplasm of breast: Secondary | ICD-10-CM

## 2019-01-23 DIAGNOSIS — Z1329 Encounter for screening for other suspected endocrine disorder: Secondary | ICD-10-CM

## 2019-01-23 MED ORDER — ADDYI 100 MG PO TABS: 1.0000 | ORAL_TABLET | Freq: Every day | ORAL | 11 refills | Status: DC

## 2019-01-23 NOTE — Progress Notes (Signed)
Gynecology Annual Exam   PCP: Glori Luis, MD  Chief Complaint:  Chief Complaint  Patient presents with  . Gynecologic Exam    History of Present Illness: Patient is a 33 y.o. G1P0102 presents for annual exam. The patient has no complaints today.   LMP: Patient's last menstrual period was 01/09/2019 (approximate). Average Interval: regular, 28 days Duration of flow: 5 days Heavy Menses: yes Clots: yes Intermenstrual Bleeding: no Postcoital Bleeding: no Dysmenorrhea: no  The patient is sexually active.She reports low sexual desire.  She currently uses none for contraception. She denies dyspareunia.  The patient does perform self breast exams.  There is no notable family history of breast or ovarian cancer in her family.  The patient wears seatbelts: yes.   The patient has regular exercise: walks with family 3-4 times a week.    The patient denies current symptoms of depression.    Review of Systems: ROS  Past Medical History:  Past Medical History:  Diagnosis Date  . Allergic reaction 05/31/2017  . Anxiety   . Depression     Past Surgical History:  Past Surgical History:  Procedure Laterality Date  . CESAREAN SECTION  09/05/2016  . CHOLECYSTECTOMY  07 31 2012  . GASTRIC BYPASS      Gynecologic History:  Patient's last menstrual period was 01/09/2019 (approximate). Contraception: none Last Pap: Results were: no abnormalities   Obstetric History: [id number]  Family History:  Family History  Problem Relation Age of Onset  . Alcohol abuse Mother   . Bipolar disorder Sister   . Alcohol abuse Sister   . Anxiety disorder Sister   . Depression Sister   . Drug abuse Sister   . Diabetes Neg Hx     Social History:  Social History   Socioeconomic History  . Marital status: Married    Spouse name: Not on file  . Number of children: Not on file  . Years of education: Not on file  . Highest education level: Not on file  Occupational History  . Not  on file  Social Needs  . Financial resource strain: Not on file  . Food insecurity    Worry: Not on file    Inability: Not on file  . Transportation needs    Medical: Not on file    Non-medical: Not on file  Tobacco Use  . Smoking status: Former Smoker    Quit date: 11/05/2010    Years since quitting: 8.2  . Smokeless tobacco: Never Used  Substance and Sexual Activity  . Alcohol use: Not Currently    Alcohol/week: 2.0 standard drinks    Types: 2 Glasses of wine per week    Frequency: Never  . Drug use: No  . Sexual activity: Yes    Birth control/protection: None  Lifestyle  . Physical activity    Days per week: Not on file    Minutes per session: Not on file  . Stress: Not on file  Relationships  . Social Musician on phone: Not on file    Gets together: Not on file    Attends religious service: Not on file    Active member of club or organization: Not on file    Attends meetings of clubs or organizations: Not on file    Relationship status: Not on file  . Intimate partner violence    Fear of current or ex partner: Not on file    Emotionally abused: Not on file  Physically abused: Not on file    Forced sexual activity: Not on file  Other Topics Concern  . Not on file  Social History Narrative  . Not on file    Allergies:  No Known Allergies  Medications: Prior to Admission medications   Medication Sig Start Date End Date Taking? Authorizing Provider  clonazePAM (KLONOPIN) 0.5 MG tablet TAKE 1 TABLET BY MOUTH EVERY DAY AS NEEDED FOR ANXIETY 01/21/19  Yes Glori LuisSonnenberg, Eric G, MD  DULoxetine (CYMBALTA) 60 MG capsule Take 1 capsule (60 mg total) by mouth daily. 01/21/19  Yes Glori LuisSonnenberg, Eric G, MD    Physical Exam Vitals: Blood pressure 120/82, pulse 63, height 5\' 8"  (1.727 m), weight 221 lb (100.2 kg), last menstrual period 01/09/2019.  General: NAD HEENT: normocephalic, anicteric Thyroid: no enlargement, no palpable nodules Pulmonary: No increased  work of breathing, CTAB Cardiovascular: RRR, distal pulses 2+ Breast: Breast symmetrical, no tenderness, no palpable nodules or masses, no skin or nipple retraction present, no nipple discharge.  No axillary or supraclavicular lymphadenopathy. Abdomen: NABS, soft, non-tender, non-distended.  Umbilicus without lesions.  No hepatomegaly, splenomegaly or masses palpable. No evidence of hernia  Genitourinary:  External: Normal external female genitalia.  Normal urethral meatus, normal Bartholin's and Skene's glands.    Vagina: Normal vaginal mucosa, no evidence of prolapse.    Cervix: Grossly normal in appearance, no bleeding  Uterus: Non-enlarged, mobile, normal contour.  No CMT  Adnexa: ovaries non-enlarged, no adnexal masses  Rectal: deferred  Lymphatic: no evidence of inguinal lymphadenopathy Extremities: no edema, erythema, or tenderness Neurologic: Grossly intact Psychiatric: mood appropriate, affect full  Female chaperone present for pelvic and breast  portions of the physical exam    Assessment: 33 y.o. G1P0102 routine annual exam  Plan: Problem List Items Addressed This Visit    None    Visit Diagnoses    Health care maintenance    -  Primary   Relevant Orders   CBC With Differential   TSH + free T4   Comprehensive metabolic panel   Lipid panel   Cervical cancer screening       Relevant Orders   Pap IG and HPV (high risk) DNA detection   Encounter for cervical Pap smear with pelvic exam       Encounter for screening breast examination       Screening for thyroid disorder       Relevant Orders   TSH + free T4   Screening cholesterol level       Relevant Orders   Lipid panel   Screening for diabetes mellitus       Relevant Orders   Comprehensive metabolic panel   Screening for iron deficiency anemia       Relevant Orders   Vitamin B12   Folate   Iron and TIBC   Ferritin   Hypoactive sexual desire       Relevant Medications   Flibanserin (ADDYI) 100 MG TABS       2) STI screening  was offered and declined  2)  ASCCP guidelines and rational discussed.  Patient opts for every 3 years screening interval  3) Contraception - the patient is currently using  none.  She is happy without contraception. Did IVF before and would be happy if she had a pregnancy again. She declines OCP or IUD for management of anemia.   4) Routine healthcare maintenance including cholesterol, diabetes screening discussed To return fasting at a later date  5)Discussed options for hypoactive  sexual disorder. Provided with information on ADDYI and VYLESSI. She would like to trial ADDYI. Discussed possible interaction of fatigue, and dizziness and to discontinue if this happens. She will follow up in 3 months.   6) Fatigue and menorrhagia. Discussed oral iron therapy/vitamin C and dietary sources of iron. Will obtain iron studies. Patient declines menorrhagia management    Return in about 3 months (around 04/25/2019) for GYN visit (fasting labs ASAP).    Adrian Prows MD [facility] 01/23/2019 12:11 PM

## 2019-01-23 NOTE — Patient Instructions (Addendum)
Institute of Medicine Recommended Dietary Allowances for Calcium and Vitamin D  Age (yr) Calcium Recommended Dietary Allowance (mg/day) Vitamin D Recommended Dietary Allowance (international units/day)  9-18 1,300 600  19-50 1,000 600  51-70 1,200 600  71 and older 1,200 800  Data from Institute of Medicine. Dietary reference intakes: calcium, vitamin D. [place], [place]: [employer]; 2011.    Flibanserin oral tablets What is this medicine? FLIBANSERIN (fly BAN ser in) is used to treat hypoactive (low) sexual desire disorder (HSDD) in women who have not gone through menopause, who have not had low sexual desire in the past, and who have low sexual desire no matter the type of sexual activity, the situation, or the sexual partner. Women with HSDD have a low sexual desire that is troubling to them, and is not due to a medical or mental health problem, problems in the relationship, medicines, or drug abuse. This medicine is not for HSDD in women who have gone through menopause. This medicine is not for men. This medicine not for use to improve sexual performance. This medicine may be used for other purposes; ask your health care provider or pharmacist if you have questions. COMMON BRAND NAME(S): Addyi What should I tell my health care provider before I take this medicine? They need to know if you have any of these conditions:  dehydration  if you drink alcohol  drug abuse or addiction  heart disease  history of depression or other mental health problems  history of a drug or alcohol abuse problem  liver disease  low blood pressure  an unusual or allergic reaction to flibanserin, other medicines, foods, dyes, or preservatives  pregnant or trying to get pregnant  breast-feeding How should I use this medicine? Take this medicine by mouth with a glass of water. Do not take with grapefruit juice. Follow the directions on the prescription label. This medicine  should only be taken at bedtime. Taking it at a time other than bedtime can increase your risk for side effects such as low blood pressure, fainting, accidentaly injury, and daytime drowsiness. If you drink alcohol wait at least 2 hours after you stop drinking alcohol before taking your dose at bedtime. Another choice is to skip your dose at bedtime if you drink alcohol in the evening. After taking your bedtime dose, do not drink alcohol until the next day. Take your medicine at regular intervals. Do not take it more often than directed. Do not stop taking except on your doctor's advice. A special MedGuide will be given to you by the pharmacist with each prescription and refill. Be sure to read this information carefully each time. Talk to your pediatrician regarding the use of this medicine in children. This medicine is not for use in children. Overdosage: If you think you have taken too much of this medicine contact a poison control center or emergency room at once. NOTE: This medicine is only for you. Do not share this medicine with others. What if I miss a dose? If you miss your dose at bedtime, skip the missed dose and take the next dose at bedtime the next day. Do not take this medicine the next morning or double your next dose. What may interact with this medicine? Do not take this medicine with any of the following medications:  certain antivirals for HIV or hepatitis  certain medicines for fungal infections like fluconazole, ketoconazole, itraconazole, or posaconazole  ciprofloxacin  clarithromycin  conivaptan  diltiazem  erythromycin  grapefruit juice  nefazodone  telithromycin  verapamil This medicine may also interact with the following medications:  alcohol  birth control pills  bupropion  certain medicines for anxiety or sleep  certain medicines for seizures like carbamazepine, phenobarbital, phenytoin  certain medicines for stomach problems like cimetidine,  esomeprazole, dexlansoprazole, lansoprazole, omeprazole, pantoprazole, rabeprazole, ranitidine  digoxin  diphenhydramine  etravirine  fluoxetine  fluvoxamine  ginkgo biloba  lorcaserin  narcotic medicines for pain  resveratrol  rifabutin  rifampin  rifapentine  sirolimus  St. John's Wort This list may not describe all possible interactions. Give your health care provider a list of all the medicines, herbs, non-prescription drugs, or dietary supplements you use. Also tell them if you smoke, drink alcohol, or use illegal drugs. Some items may interact with your medicine. What should I watch for while using this medicine? Visit your doctor or health care professional for regular checks on your progress. Tell your doctor if your symptoms have not improved after you have taken this medicine for 8 weeks. You may get dizzy or drowsy. Do not drive, use machinery, or do anything that needs mental alertness for at least 6 hours after you take your dose and until you know how this medicine affects you. The risk of severe drowsiness is increased if you are also taking other medicines that cause drowsiness, or if you take this medicine during waking hours. Only take this medicine at bedtime. Alcohol can increase dizziness and drowsiness, and can increase the risk of low blood pressure or fainting spells when combined with this medicine. If you drink alcohol wait at least 2 hours after you stop drinking alcohol before taking your medicine at bedtime. Alternatively, skip your bedtime dose if you drink alcohol in the evening. After you have taken your medicine at bedtime do not drink alcohol until the following day. Do not stand or sit up quickly. This reduces the risk of dizzy or fainting spells. This medicine can cause low blood pressure, sometimes with dizziness and fainting spells. If you begin to feel dizzy or lightheaded, lie down and call for help if the symptoms don't go away. This  medicine is only available through a restricted program called the ADDYI REMS Program, and can only be obtained through certified pharmacies participating in the program. For more information about the Program and a list of pharmacies that are enrolled in the Program, go to www.AddyiREMS.com or call 1-844-PINK-PILL ([phone]). What side effects may I notice from receiving this medicine? Side effects that you should report to your doctor or health care professional as soon as possible:  allergic reactions like skin rash, itching or hives, swelling of the face, lips, or tongue  extreme drowsiness  signs and symptoms of low blood pressure like dizziness; feeling faint or lightheaded, falls; unusually weak or tired Side effects that usually do not require medical attention (report to your doctor or health care professional if they continue or are bothersome):  dry mouth  nausea  tiredness  trouble sleeping This list may not describe all possible side effects. Call your doctor for medical advice about side effects. You may report side effects to FDA at 1-800-FDA-1088. Where should I keep my medicine? Keep out of the reach of children. Store at room temperature between 15 and 30 degrees C (59 and 86 degrees F). Throw away any unused medicine after the expiration date. NOTE: This sheet is a summary. It may not cover all possible information. If you have questions about this medicine, talk to your doctor,  pharmacist, or health care provider.  2020 Elsevier/Gold Standard (2018-01-16 14:13:32)  [email or web address]   Bremelanotide injection What is this medicine? BREMELANOTIDE (BRE mel AN oh tide) is used to treat hypoactive (low) sexual desire disorder (HSDD) in women who have not gone through menopause, who have not had low sexual desire in the past, and who have low sexual desire no matter the type of sexual activity, the situation, or the sexual partner. Women with HSDD have a low sexual  desire that is troubling to them, and is not due to a medical or mental health problem, problems in the relationship, medicines, or drug abuse. This medicine is not for HSDD in women who have gone through menopause. This medicine is not for men. This medicine not for use to improve sexual performance. This medicine may be used for other purposes; ask your health care provider or pharmacist if you have questions. COMMON BRAND NAME(S): VYLEESI What should I tell my health care provider before I take this medicine? They need to know if you have any of these conditions:  heart disease  high blood pressure  an unusual or allergic reaction to bremelanotide, other medicines, foods, dyes, or preservatives  pregnant or trying to get pregnant  breast-feeding How should I use this medicine? This medicine is for injection under the skin. You will be taught how to prepare and give this medicine. Use exactly as directed. Do not take your medicine more often than directed. It is important that you put your used autoinjector in a special sharps container. Do not put them in a trash can. If you do not have a sharps container, call your pharmacist or healthcare provider to get one. A special MedGuide will be given to you by the pharmacist with each prescription and refill. Be sure to read this information carefully each time. Talk to your pediatrician regarding the use of this medicine in children. Special care may be needed. Overdosage: If you think you have taken too much of this medicine contact a poison control center or emergency room at once. NOTE: This medicine is only for you. Do not share this medicine with others. What if I miss a dose? This does not apply. This medicine is not for regular use. What may interact with this medicine?  naltrexone products for drug or alcohol addiction This list may not describe all possible interactions. Give your health care provider a list of all the medicines,  herbs, non-prescription drugs, or dietary supplements you use. Also tell them if you smoke, drink alcohol, or use illegal drugs. Some items may interact with your medicine. What should I watch for while using this medicine? Visit your doctor or health care professional for regular checks on your progress. Tell your doctor if your symptoms have not improved after you have taken this medicine for 8 weeks. Do not become pregnant while taking this medicine. Women should inform their healthcare professional if they wish to become pregnant or think they might be pregnant. There is a potential for serious side effects and harm to an unborn child. Talk to your healthcare professional for more information. What side effects may I notice from receiving this medicine? Side effects that you should report to your doctor or health care professional as soon as possible:  allergic reactions like skin rash, itching or hives, swelling of the face, lips, or tongue  high blood pressure  severe nausea that does not go away  skin discoloration, such as dark spots on the face, gums, or  breast Side effects that usually do not require medical attention (report these to your doctor or health care professional if they continue or are bothersome):  cough  dizziness  flushing  headache  hot flashes  nausea, vomiting  pain, redness or irritation at site where injected  stuffy nose  tiredness This list may not describe all possible side effects. Call your doctor for medical advice about side effects. You may report side effects to FDA at 1-800-FDA-1088. Where should I keep my medicine? Keep out of the reach of children. Store unopened autoinjectors at or below 25 degrees C (77 degrees F). Do not freeze. Protect from light. Throw away any unused medicine after the expiration date. NOTE: This sheet is a summary. It may not cover all possible information. If you have questions about this medicine, talk to your  doctor, pharmacist, or health care provider.  2020 Elsevier/Gold Standard (2017-10-10 14:08:51)

## 2019-01-28 ENCOUNTER — Other Ambulatory Visit: Payer: Self-pay

## 2019-01-28 ENCOUNTER — Other Ambulatory Visit: Payer: Managed Care, Other (non HMO)

## 2019-01-28 DIAGNOSIS — Z0000 Encounter for general adult medical examination without abnormal findings: Secondary | ICD-10-CM

## 2019-01-28 DIAGNOSIS — Z131 Encounter for screening for diabetes mellitus: Secondary | ICD-10-CM

## 2019-01-28 DIAGNOSIS — Z1329 Encounter for screening for other suspected endocrine disorder: Secondary | ICD-10-CM

## 2019-01-28 DIAGNOSIS — Z13220 Encounter for screening for lipoid disorders: Secondary | ICD-10-CM

## 2019-01-28 DIAGNOSIS — Z130 Encounter for screening for diseases of the blood and blood-forming organs and certain disorders involving the immune mechanism: Secondary | ICD-10-CM

## 2019-01-29 ENCOUNTER — Other Ambulatory Visit: Payer: Self-pay | Admitting: Obstetrics and Gynecology

## 2019-01-29 ENCOUNTER — Telehealth: Payer: Self-pay | Admitting: Obstetrics and Gynecology

## 2019-01-29 DIAGNOSIS — N920 Excessive and frequent menstruation with regular cycle: Secondary | ICD-10-CM

## 2019-01-29 DIAGNOSIS — D500 Iron deficiency anemia secondary to blood loss (chronic): Secondary | ICD-10-CM

## 2019-01-29 LAB — CBC WITH DIFFERENTIAL
Basophils Absolute: 0 10*3/uL (ref 0.0–0.2)
Basos: 1 %
EOS (ABSOLUTE): 0.1 10*3/uL (ref 0.0–0.4)
Eos: 2 %
Hematocrit: 24 % — ABNORMAL LOW (ref 34.0–46.6)
Hemoglobin: 6.4 g/dL — CL (ref 11.1–15.9)
Immature Grans (Abs): 0 10*3/uL (ref 0.0–0.1)
Immature Granulocytes: 0 %
Lymphocytes Absolute: 1.3 10*3/uL (ref 0.7–3.1)
Lymphs: 39 %
MCH: 15.8 pg — ABNORMAL LOW (ref 26.6–33.0)
MCHC: 26.7 g/dL — ABNORMAL LOW (ref 31.5–35.7)
MCV: 59 fL — ABNORMAL LOW (ref 79–97)
Monocytes Absolute: 0.2 10*3/uL (ref 0.1–0.9)
Monocytes: 7 %
Neutrophils Absolute: 1.8 10*3/uL (ref 1.4–7.0)
Neutrophils: 51 %
RBC: 4.04 x10E6/uL (ref 3.77–5.28)
RDW: 17.9 % — ABNORMAL HIGH (ref 11.7–15.4)
WBC: 3.4 10*3/uL (ref 3.4–10.8)

## 2019-01-29 LAB — IRON AND TIBC
Iron Saturation: 3 % — CL (ref 15–55)
Iron: 14 ug/dL — ABNORMAL LOW (ref 27–159)
Total Iron Binding Capacity: 474 ug/dL — ABNORMAL HIGH (ref 250–450)
UIBC: 460 ug/dL — ABNORMAL HIGH (ref 131–425)

## 2019-01-29 LAB — COMPREHENSIVE METABOLIC PANEL
ALT: 13 IU/L (ref 0–32)
AST: 22 IU/L (ref 0–40)
Albumin/Globulin Ratio: 1.7 (ref 1.2–2.2)
Albumin: 4.1 g/dL (ref 3.8–4.8)
Alkaline Phosphatase: 73 IU/L (ref 39–117)
BUN/Creatinine Ratio: 12 (ref 9–23)
BUN: 8 mg/dL (ref 6–20)
Bilirubin Total: 0.2 mg/dL (ref 0.0–1.2)
CO2: 21 mmol/L (ref 20–29)
Calcium: 8.8 mg/dL (ref 8.7–10.2)
Chloride: 109 mmol/L — ABNORMAL HIGH (ref 96–106)
Creatinine, Ser: 0.65 mg/dL (ref 0.57–1.00)
GFR calc Af Amer: 133 mL/min/{1.73_m2} (ref >59)
GFR calc non Af Amer: 115 mL/min/{1.73_m2} (ref >59)
Globulin, Total: 2.4 g/dL (ref 1.5–4.5)
Glucose: 76 mg/dL (ref 65–99)
Potassium: 4.4 mmol/L (ref 3.5–5.2)
Sodium: 141 mmol/L (ref 134–144)
Total Protein: 6.5 g/dL (ref 6.0–8.5)

## 2019-01-29 LAB — LIPID PANEL
Chol/HDL Ratio: 2 ratio (ref 0.0–4.4)
Cholesterol, Total: 131 mg/dL (ref 100–199)
HDL: 64 mg/dL (ref >39)
LDL Chol Calc (NIH): 56 mg/dL (ref 0–99)
Triglycerides: 46 mg/dL (ref 0–149)
VLDL Cholesterol Cal: 11 mg/dL (ref 5–40)

## 2019-01-29 LAB — TSH+FREE T4
Free T4: 1.08 ng/dL (ref 0.82–1.77)
TSH: 1.38 u[IU]/mL (ref 0.450–4.500)

## 2019-01-29 LAB — FOLATE: Folate: 8.3 ng/mL (ref >3.0)

## 2019-01-29 LAB — FERRITIN: Ferritin: 3 ng/mL — ABNORMAL LOW (ref 15–150)

## 2019-01-29 MED ORDER — TRANEXAMIC ACID 650 MG PO TABS: 1300.0000 mg | ORAL_TABLET | Freq: Three times a day (TID) | ORAL | 2 refills | Status: DC

## 2019-01-29 NOTE — Progress Notes (Signed)
Called and discussed in detail with patient.

## 2019-01-29 NOTE — Progress Notes (Signed)
Called and discussed anemia with patient. She reports that last week she did have some light headed and dizzy feeling especially with standing quickly. She was offered admission for transfusion but declined. She would like to do oral and IV iron, urgent referral placed to hematology. Anemia is likely secondary to reported menorrhagia. Discussed managment options. Would like to trial Lysteda. Will follow up for pelvic US in office soon. Next period not for 2 weeks.   Patient denied risk factors for stroke, PE, DVT, and heart attack. She is not on hormonal medications.  Discussed bleeding precautions. Discussed reasons to come to the hospital. Discussed iron rich diet and food sources. Discussed OTC iron supplementation.   Adrian Prows MD [facility] 01/29/2019 11:24 AM

## 2019-01-29 NOTE — Telephone Encounter (Signed)
-----   Message from [place] sent at 01/29/2019 12:15 PM EDT ----- Clarise Cruz, will you please schedule the patient's pelvic ultrasound and f/u with Dr. Gilman Schmidt in the next couple of weeks? Thanks.

## 2019-01-29 NOTE — Telephone Encounter (Signed)
Called and left voice mail for patient to call back to be schedule °

## 2019-01-31 LAB — PAP IG AND HPV HIGH-RISK: HPV, high-risk: NEGATIVE

## 2019-01-31 NOTE — Telephone Encounter (Signed)
Patient is schedule for 02/11/19 for TVUS and follow up. Patient is requesting a review on labs. Please advise

## 2019-01-31 NOTE — Telephone Encounter (Signed)
I called, she did not answer. I have already discussed the results with her, so I'm not sure what this is in regards to.

## 2019-01-31 NOTE — Telephone Encounter (Signed)
Pap result is not back yet- please let patient know

## 2019-01-31 NOTE — Telephone Encounter (Signed)
Patient returning call, she states she is wanting to go over pap results.

## 2019-02-01 ENCOUNTER — Encounter: Payer: Self-pay | Admitting: Oncology

## 2019-02-01 ENCOUNTER — Other Ambulatory Visit: Payer: Self-pay

## 2019-02-01 ENCOUNTER — Inpatient Hospital Stay: Payer: Managed Care, Other (non HMO) | Attending: Oncology | Admitting: Oncology

## 2019-02-01 VITALS — BP 108/74 | HR 90 | Temp 98.1°F | Resp 18 | Ht 69.09 in | Wt 221.5 lb

## 2019-02-01 DIAGNOSIS — D500 Iron deficiency anemia secondary to blood loss (chronic): Secondary | ICD-10-CM | POA: Diagnosis present

## 2019-02-01 DIAGNOSIS — D509 Iron deficiency anemia, unspecified: Secondary | ICD-10-CM | POA: Insufficient documentation

## 2019-02-01 DIAGNOSIS — Z9884 Bariatric surgery status: Secondary | ICD-10-CM | POA: Insufficient documentation

## 2019-02-01 DIAGNOSIS — N920 Excessive and frequent menstruation with regular cycle: Secondary | ICD-10-CM | POA: Insufficient documentation

## 2019-02-01 DIAGNOSIS — F418 Other specified anxiety disorders: Secondary | ICD-10-CM | POA: Diagnosis not present

## 2019-02-01 DIAGNOSIS — Z87891 Personal history of nicotine dependence: Secondary | ICD-10-CM | POA: Insufficient documentation

## 2019-02-01 DIAGNOSIS — R5383 Other fatigue: Secondary | ICD-10-CM | POA: Insufficient documentation

## 2019-02-01 DIAGNOSIS — R0609 Other forms of dyspnea: Secondary | ICD-10-CM | POA: Insufficient documentation

## 2019-02-01 DIAGNOSIS — R42 Dizziness and giddiness: Secondary | ICD-10-CM | POA: Insufficient documentation

## 2019-02-01 DIAGNOSIS — Z79899 Other long term (current) drug therapy: Secondary | ICD-10-CM | POA: Insufficient documentation

## 2019-02-01 HISTORY — DX: Iron deficiency anemia, unspecified: D50.9

## 2019-02-01 NOTE — Progress Notes (Signed)
Per Dr. Tasia Catchings, pt to get venofer BID x 2. There is room for patient to get infusions on thurs and Friday next week, but per pharmacy, there has to be 2 days in between infusions. Pt scheduled for 1 infusion the week of 10/19, 2 infusions the week of 10/26 and 1 infusion the week of 11/2. Patient aware and is ok with it.

## 2019-02-01 NOTE — Progress Notes (Signed)
WNL- released to mychart with note

## 2019-02-01 NOTE — Progress Notes (Signed)
Hematology/Oncology Consult note [facility] [phone] Fax:(336) [phone]   Patient Care Team: Glori LuisSonnenberg, Eric G, MD as PCP - General (Family Medicine)  REFERRING PROVIDER: Natale MilchSchuman, Christanna R, * CHIEF COMPLAINTS/REASON FOR VISIT:  Evaluation of iron deficiency anemia  HISTORY OF PRESENTING ILLNESS:  Mary Fox is a  33 y.o.  female with PMH listed below was seen in consultation at the request of Schuman, Christanna R, *  for evaluation of iron deficiency anemia.   Reviewed patient's recent labs  01/28/2019 labs revealed anemia with hemoglobin of 6.4, MCV 59, normal WBC, platelet count and differential. Iron panel showed ferritin of 3, iron saturation 3. Reviewed patient's previous labs ordered by primary care physician's office, anemia is chronic onset , duration is since at least 2018. Patient was offered blood transfusion by GYN.  She declined. Associated signs and symptoms: Patient reports fatigue.  She also reports mild shortness of breath with exertion.  Occasionally she feels lightheaded. Denies weight loss, easy bruising, hematochezia, hemoptysis, hematuria. Context:  History of iron deficiency: Yes recently diagnosed.  Started on oral slow FE tablets.  Also takes vitamin C tablets. Rectal bleeding: Occasionally. Menstrual bleeding/ Vaginal bleeding : Heavy menstrual bleeding. Hematemesis or hemoptysis : denies Blood in urine : denies  Last endoscopy: Fatigue: Yes.  History of gastric bypass surgery   Review of Systems - Oncology  MEDICAL HISTORY:  Past Medical History:  Diagnosis Date  . Allergic reaction 05/31/2017  . Anxiety   . Depression   . IDA (iron deficiency anemia) 02/01/2019  . IDA (iron deficiency anemia) 02/01/2019    SURGICAL HISTORY: Past Surgical History:  Procedure Laterality Date  . CESAREAN SECTION  09/05/2016  . CHOLECYSTECTOMY  07 31 2012  . GASTRIC BYPASS      SOCIAL HISTORY: Social History    Socioeconomic History  . Marital status: Married    Spouse name: Not on file  . Number of children: Not on file  . Years of education: Not on file  . Highest education level: Not on file  Occupational History  . Not on file  Social Needs  . Financial resource strain: Not on file  . Food insecurity    Worry: Not on file    Inability: Not on file  . Transportation needs    Medical: Not on file    Non-medical: Not on file  Tobacco Use  . Smoking status: Former Smoker    Quit date: 11/05/2010    Years since quitting: 8.2  . Smokeless tobacco: Never Used  Substance and Sexual Activity  . Alcohol use: Not Currently    Alcohol/week: 2.0 standard drinks    Types: 2 Glasses of wine per week    Frequency: Never  . Drug use: No  . Sexual activity: Yes    Birth control/protection: None  Lifestyle  . Physical activity    Days per week: Not on file    Minutes per session: Not on file  . Stress: Not on file  Relationships  . Social Musicianconnections    Talks on phone: Not on file    Gets together: Not on file    Attends religious service: Not on file    Active member of club or organization: Not on file    Attends meetings of clubs or organizations: Not on file    Relationship status: Not on file  . Intimate partner violence    Fear of current or ex partner: Not on file    Emotionally abused:  Not on file    Physically abused: Not on file    Forced sexual activity: Not on file  Other Topics Concern  . Not on file  Social History Narrative  . Not on file    FAMILY HISTORY: Family History  Problem Relation Age of Onset  . Alcohol abuse Mother   . Bipolar disorder Sister   . Alcohol abuse Sister   . Anxiety disorder Sister   . Depression Sister   . Drug abuse Sister   . Diabetes Neg Hx     ALLERGIES:  has No Known Allergies.  MEDICATIONS:  Current Outpatient Medications  Medication Sig Dispense Refill  . clonazePAM (KLONOPIN) 0.5 MG tablet TAKE 1 TABLET BY MOUTH EVERY DAY  AS NEEDED FOR ANXIETY 30 tablet 2  . DULoxetine (CYMBALTA) 60 MG capsule Take 1 capsule (60 mg total) by mouth daily. 90 capsule 3  . Flibanserin (ADDYI) 100 MG TABS Take 1 tablet by mouth daily. 30 tablet 11  . tranexamic acid (LYSTEDA) 650 MG TABS tablet Take 2 tablets (1,300 mg total) by mouth 3 (three) times daily. Take during menses for a maximum of five days 30 tablet 2   No current facility-administered medications for this visit.      PHYSICAL EXAMINATION: ECOG PERFORMANCE STATUS: 1 - Symptomatic but completely ambulatory Vitals:   02/01/19 1058  BP: 108/74  Pulse: 90  Resp: 18  Temp: 98.1 F (36.7 C)   There were no vitals filed for this visit.  Physical Exam Constitutional:      General: She is not in acute distress. HENT:     Head: Normocephalic and atraumatic.  Eyes:     General: No scleral icterus.    Pupils: Pupils are equal, round, and reactive to light.  Neck:     Musculoskeletal: Normal range of motion and neck supple.  Cardiovascular:     Rate and Rhythm: Normal rate and regular rhythm.     Heart sounds: Normal heart sounds.  Pulmonary:     Effort: Pulmonary effort is normal. No respiratory distress.     Breath sounds: No wheezing.  Abdominal:     General: Bowel sounds are normal. There is no distension.     Palpations: Abdomen is soft. There is no mass.     Tenderness: There is no abdominal tenderness.  Musculoskeletal: Normal range of motion.        General: No deformity.  Skin:    General: Skin is warm and dry.     Coloration: Skin is pale.     Findings: No erythema or rash.  Neurological:     Mental Status: She is alert and oriented to person, place, and time.     Cranial Nerves: No cranial nerve deficit.     Coordination: Coordination normal.  Psychiatric:        Behavior: Behavior normal.        Thought Content: Thought content normal.       CMP Latest Ref Rng & Units 01/28/2019  Glucose 65 - 99 mg/dL 76  BUN 6 - 20 mg/dL 8   Creatinine 0.57 - 1.00 mg/dL 0.65  Sodium 134 - 144 mmol/L 141  Potassium 3.5 - 5.2 mmol/L 4.4  Chloride 96 - 106 mmol/L 109(H)  CO2 20 - 29 mmol/L 21  Calcium 8.7 - 10.2 mg/dL 8.8  Total Protein 6.0 - 8.5 g/dL 6.5  Total Bilirubin 0.0 - 1.2 mg/dL 0.2  Alkaline Phos 39 - 117 IU/L 73  AST 0 -  40 IU/L 22  ALT 0 - 32 IU/L 13   CBC Latest Ref Rng & Units 01/28/2019  WBC 3.4 - 10.8 x10E3/uL 3.4  Hemoglobin 11.1 - 15.9 g/dL 6.4(LL)  Hematocrit 34.0 - 46.6 % 24.0(L)  Platelets 150 - 440 K/uL -     LABORATORY DATA:  I have reviewed the data as listed Lab Results  Component Value Date   WBC 3.4 01/28/2019   HGB 6.4 (LL) 01/28/2019   HCT 24.0 (L) 01/28/2019   MCV 59 (L) 01/28/2019   PLT 194 11/19/2014   Recent Labs    01/28/19 0815  NA 141  K 4.4  CL 109*  CO2 21  GLUCOSE 76  BUN 8  CREATININE 0.65  CALCIUM 8.8  GFRNONAA 115  GFRAA 133  PROT 6.5  ALBUMIN 4.1  AST 22  ALT 13  ALKPHOS 73  BILITOT 0.2   Iron/TIBC/Ferritin/ %Sat    Component Value Date/Time   IRON 14 (L) 01/28/2019 0815   TIBC 474 (H) 01/28/2019 0815   FERRITIN 3 (L) 01/28/2019 0815   IRONPCTSAT 3 (LL) 01/28/2019 0815     No results found.    ASSESSMENT & PLAN:  1. Menorrhagia with regular cycle   2. Iron deficiency anemia due to chronic blood loss    Labs are reviewed and discussed with patient. Consistent with iron deficiency anemia. Plan IV iron with Venofer 200mg  twice a week x 4 doses. Allergy reactions/infusion reaction including anaphylactic reaction discussed with patient. Other side effects include but not limited to high blood pressure, skin rash, weight gain, leg swelling, etc. Patient voices understanding and willing to proceed.  Menorrhagia.  Patient has ultrasound pelvis scheduled.  Continue follow-up with gynecology Orders Placed This Encounter  Procedures  . CBC with Differential/Platelet    Standing Status:   Future    Standing Expiration Date:   02/01/2020  . Iron and  TIBC    Standing Status:   Future    Standing Expiration Date:   02/01/2020  . Ferritin    Standing Status:   Future    Standing Expiration Date:   02/01/2020    All questions were answered. The patient knows to call the clinic with any problems questions or concerns.  Cc Schuman, Christanna R, *  Return of visit: 4 weeks for follow-up and evaluation of additional IV iron. Thank you for this kind referral and the opportunity to participate in the care of this patient. A copy of today's note is routed to referring provider  Total face to face encounter time for this patient visit was 02/03/2020. >50% of the time was  spent in counseling and coordination of care.    , MD, PhD Hematology Oncology [facility] Pager- [facility] 02/01/2019

## 2019-02-01 NOTE — Progress Notes (Signed)
Patient here for "emergent" referral due to anemia.  Reports feeling extreme fatigue and dizziness.

## 2019-02-06 ENCOUNTER — Other Ambulatory Visit: Payer: Self-pay

## 2019-02-07 ENCOUNTER — Other Ambulatory Visit: Payer: Self-pay

## 2019-02-07 ENCOUNTER — Inpatient Hospital Stay: Payer: Managed Care, Other (non HMO)

## 2019-02-07 ENCOUNTER — Encounter: Payer: Self-pay | Admitting: Family Medicine

## 2019-02-07 VITALS — BP 110/75 | HR 87 | Temp 97.5°F | Resp 20

## 2019-02-07 DIAGNOSIS — F32A Depression, unspecified: Secondary | ICD-10-CM

## 2019-02-07 DIAGNOSIS — F329 Major depressive disorder, single episode, unspecified: Secondary | ICD-10-CM

## 2019-02-07 DIAGNOSIS — D500 Iron deficiency anemia secondary to blood loss (chronic): Secondary | ICD-10-CM | POA: Diagnosis not present

## 2019-02-07 DIAGNOSIS — F419 Anxiety disorder, unspecified: Secondary | ICD-10-CM

## 2019-02-07 MED ORDER — IRON SUCROSE 20 MG/ML IV SOLN
200.0000 mg | Freq: Once | INTRAVENOUS | Status: AC
  Administered 2019-02-07: 200 mg via INTRAVENOUS
  Filled 2019-02-07: qty 10

## 2019-02-07 MED ORDER — SODIUM CHLORIDE 0.9 % IV SOLN
Freq: Once | INTRAVENOUS | Status: AC
  Administered 2019-02-07: 14:00:00 via INTRAVENOUS
  Filled 2019-02-07: qty 250

## 2019-02-11 ENCOUNTER — Other Ambulatory Visit: Payer: Managed Care, Other (non HMO)

## 2019-02-11 ENCOUNTER — Ambulatory Visit: Payer: Managed Care, Other (non HMO) | Admitting: Obstetrics and Gynecology

## 2019-02-12 ENCOUNTER — Other Ambulatory Visit: Payer: Self-pay

## 2019-02-12 ENCOUNTER — Inpatient Hospital Stay: Payer: Managed Care, Other (non HMO)

## 2019-02-12 VITALS — BP 114/66 | HR 71 | Temp 97.5°F | Resp 18

## 2019-02-12 DIAGNOSIS — D500 Iron deficiency anemia secondary to blood loss (chronic): Secondary | ICD-10-CM

## 2019-02-12 MED ORDER — SODIUM CHLORIDE 0.9 % IV SOLN
Freq: Once | INTRAVENOUS | Status: AC
  Administered 2019-02-12: 13:00:00 via INTRAVENOUS
  Filled 2019-02-12: qty 250

## 2019-02-12 MED ORDER — IRON SUCROSE 20 MG/ML IV SOLN
200.0000 mg | Freq: Once | INTRAVENOUS | Status: AC
  Administered 2019-02-12: 200 mg via INTRAVENOUS
  Filled 2019-02-12: qty 10

## 2019-02-12 NOTE — Progress Notes (Signed)
Pt tolerated infusion well. Pt and VS stable at discharge.  

## 2019-02-15 ENCOUNTER — Inpatient Hospital Stay: Payer: Managed Care, Other (non HMO)

## 2019-02-15 ENCOUNTER — Other Ambulatory Visit: Payer: Self-pay

## 2019-02-15 VITALS — BP 113/74 | HR 61 | Temp 97.5°F

## 2019-02-15 DIAGNOSIS — D500 Iron deficiency anemia secondary to blood loss (chronic): Secondary | ICD-10-CM

## 2019-02-15 MED ORDER — SODIUM CHLORIDE 0.9 % IV SOLN
Freq: Once | INTRAVENOUS | Status: AC
  Administered 2019-02-15: 14:00:00 via INTRAVENOUS
  Filled 2019-02-15: qty 250

## 2019-02-15 MED ORDER — IRON SUCROSE 20 MG/ML IV SOLN
200.0000 mg | Freq: Once | INTRAVENOUS | Status: AC
  Administered 2019-02-15: 14:00:00 200 mg via INTRAVENOUS
  Filled 2019-02-15: qty 10

## 2019-02-15 NOTE — Progress Notes (Signed)
Pt tolerated venofer infusion well. VSS. Pt discharged in NAD.

## 2019-02-19 ENCOUNTER — Inpatient Hospital Stay: Payer: Managed Care, Other (non HMO) | Attending: Oncology

## 2019-02-19 ENCOUNTER — Other Ambulatory Visit: Payer: Self-pay

## 2019-02-19 VITALS — BP 112/70 | HR 72 | Temp 97.4°F | Resp 18

## 2019-02-19 DIAGNOSIS — R5383 Other fatigue: Secondary | ICD-10-CM | POA: Insufficient documentation

## 2019-02-19 DIAGNOSIS — R42 Dizziness and giddiness: Secondary | ICD-10-CM | POA: Diagnosis not present

## 2019-02-19 DIAGNOSIS — Z9884 Bariatric surgery status: Secondary | ICD-10-CM | POA: Insufficient documentation

## 2019-02-19 DIAGNOSIS — Z79899 Other long term (current) drug therapy: Secondary | ICD-10-CM | POA: Insufficient documentation

## 2019-02-19 DIAGNOSIS — N920 Excessive and frequent menstruation with regular cycle: Secondary | ICD-10-CM | POA: Insufficient documentation

## 2019-02-19 DIAGNOSIS — R0609 Other forms of dyspnea: Secondary | ICD-10-CM | POA: Diagnosis not present

## 2019-02-19 DIAGNOSIS — D500 Iron deficiency anemia secondary to blood loss (chronic): Secondary | ICD-10-CM | POA: Diagnosis present

## 2019-02-19 DIAGNOSIS — Z87891 Personal history of nicotine dependence: Secondary | ICD-10-CM | POA: Insufficient documentation

## 2019-02-19 MED ORDER — SODIUM CHLORIDE 0.9 % IV SOLN
Freq: Once | INTRAVENOUS | Status: AC
  Administered 2019-02-19: 14:00:00 via INTRAVENOUS
  Filled 2019-02-19: qty 250

## 2019-02-19 MED ORDER — IRON SUCROSE 20 MG/ML IV SOLN
200.0000 mg | Freq: Once | INTRAVENOUS | Status: AC
  Administered 2019-02-19: 200 mg via INTRAVENOUS
  Filled 2019-02-19: qty 10

## 2019-03-01 ENCOUNTER — Inpatient Hospital Stay (HOSPITAL_BASED_OUTPATIENT_CLINIC_OR_DEPARTMENT_OTHER): Payer: Managed Care, Other (non HMO) | Admitting: Oncology

## 2019-03-01 ENCOUNTER — Inpatient Hospital Stay: Payer: Managed Care, Other (non HMO)

## 2019-03-01 ENCOUNTER — Encounter: Payer: Self-pay | Admitting: Oncology

## 2019-03-01 ENCOUNTER — Other Ambulatory Visit: Payer: Self-pay

## 2019-03-01 VITALS — BP 113/74 | HR 69 | Temp 97.7°F | Resp 18 | Wt 215.0 lb

## 2019-03-01 VITALS — BP 114/73 | HR 57 | Resp 18

## 2019-03-01 DIAGNOSIS — N920 Excessive and frequent menstruation with regular cycle: Secondary | ICD-10-CM

## 2019-03-01 DIAGNOSIS — D500 Iron deficiency anemia secondary to blood loss (chronic): Secondary | ICD-10-CM

## 2019-03-01 MED ORDER — IRON SUCROSE 20 MG/ML IV SOLN
200.0000 mg | Freq: Once | INTRAVENOUS | Status: AC
  Administered 2019-03-01: 200 mg via INTRAVENOUS
  Filled 2019-03-01: qty 10

## 2019-03-01 MED ORDER — SODIUM CHLORIDE 0.9 % IV SOLN
Freq: Once | INTRAVENOUS | Status: AC
  Administered 2019-03-01: 15:00:00 via INTRAVENOUS
  Filled 2019-03-01: qty 250

## 2019-03-01 NOTE — Progress Notes (Signed)
Patient does not offer any problems today.  

## 2019-03-02 NOTE — Progress Notes (Signed)
Hematology/Oncology follow up note [facility] Telephone:(336) [phone] Fax:(336) [phone]   Patient Care Team: Glori LuisSonnenberg, Eric G, MD as PCP - General (Family Medicine)  REFERRING PROVIDER: Glori LuisSonnenberg, Eric G, MD CHIEF COMPLAINTS/REASON FOR VISIT:  Evaluation of iron deficiency anemia  HISTORY OF PRESENTING ILLNESS:  Mary Fox is a  33 y.o.  female with PMH listed below was seen in consultation at the request of Glori LuisSonnenberg, Eric G, MD  for evaluation of iron deficiency anemia.   Reviewed patient's recent labs  01/28/2019 labs revealed anemia with hemoglobin of 6.4, MCV 59, normal WBC, platelet count and differential. Iron panel showed ferritin of 3, iron saturation 3. Reviewed patient's previous labs ordered by primary care physician's office, anemia is chronic onset , duration is since at least 2018. Patient was offered blood transfusion by GYN.  She declined. Associated signs and symptoms: Patient reports fatigue.  She also reports mild shortness of breath with exertion.  Occasionally she feels lightheaded. Denies weight loss, easy bruising, hematochezia, hemoptysis, hematuria. Context:  History of iron deficiency: Yes recently diagnosed.  Started on oral slow FE tablets.  Also takes vitamin C tablets. Rectal bleeding: Occasionally. Menstrual bleeding/ Vaginal bleeding : Heavy menstrual bleeding. Hematemesis or hemoptysis : denies Blood in urine : denies  Last endoscopy: Fatigue: Yes.  History of gastric bypass surgery  INTERVAL HISTORY Mary Bellingshley Dickenson is a 33 y.o. female who has above history reviewed by me today presents for follow up visit for management of  Problems and complaints are listed below: S/p IV Venofer weekly x 3.  Fatigue has significantly improved.    Review of Systems  Constitutional: Negative for appetite change, chills, fatigue and fever.  HENT:   Negative for hearing loss and voice change.   Eyes: Negative for eye problems.   Respiratory: Negative for chest tightness and cough.   Cardiovascular: Negative for chest pain.  Gastrointestinal: Negative for abdominal distention, abdominal pain and blood in stool.  Endocrine: Negative for hot flashes.  Genitourinary: Negative for difficulty urinating and frequency.   Musculoskeletal: Negative for arthralgias.  Skin: Negative for itching and rash.  Neurological: Negative for extremity weakness.  Hematological: Negative for adenopathy.  Psychiatric/Behavioral: Negative for confusion.    MEDICAL HISTORY:  Past Medical History:  Diagnosis Date  . Allergic reaction 05/31/2017  . Anxiety   . Depression   . IDA (iron deficiency anemia) 02/01/2019  . IDA (iron deficiency anemia) 02/01/2019    SURGICAL HISTORY: Past Surgical History:  Procedure Laterality Date  . CESAREAN SECTION  09/05/2016  . CHOLECYSTECTOMY  07 31 2012  . GASTRIC BYPASS      SOCIAL HISTORY: Social History   Socioeconomic History  . Marital status: Married    Spouse name: Not on file  . Number of children: Not on file  . Years of education: Not on file  . Highest education level: Not on file  Occupational History  . Not on file  Social Needs  . Financial resource strain: Not on file  . Food insecurity    Worry: Not on file    Inability: Not on file  . Transportation needs    Medical: Not on file    Non-medical: Not on file  Tobacco Use  . Smoking status: Former Smoker    Quit date: 11/05/2010    Years since quitting: 8.3  . Smokeless tobacco: Never Used  Substance and Sexual Activity  . Alcohol use: Not Currently    Alcohol/week: 2.0 standard drinks  Types: 2 Glasses of wine per week    Frequency: Never  . Drug use: No  . Sexual activity: Yes    Birth control/protection: None  Lifestyle  . Physical activity    Days per week: Not on file    Minutes per session: Not on file  . Stress: Not on file  Relationships  . Social Musician on phone: Not on file     Gets together: Not on file    Attends religious service: Not on file    Active member of club or organization: Not on file    Attends meetings of clubs or organizations: Not on file    Relationship status: Not on file  . Intimate partner violence    Fear of current or ex partner: Not on file    Emotionally abused: Not on file    Physically abused: Not on file    Forced sexual activity: Not on file  Other Topics Concern  . Not on file  Social History Narrative  . Not on file    FAMILY HISTORY: Family History  Problem Relation Age of Onset  . Alcohol abuse Mother   . Bipolar disorder Sister   . Alcohol abuse Sister   . Anxiety disorder Sister   . Depression Sister   . Drug abuse Sister   . Diabetes Neg Hx     ALLERGIES:  has No Known Allergies.  MEDICATIONS:  Current Outpatient Medications  Medication Sig Dispense Refill  . clonazePAM (KLONOPIN) 0.5 MG tablet TAKE 1 TABLET BY MOUTH EVERY DAY AS NEEDED FOR ANXIETY 30 tablet 2  . DULoxetine (CYMBALTA) 60 MG capsule Take 1 capsule (60 mg total) by mouth daily. 90 capsule 3  . Ferrous Sulfate Dried (EQ SLOW-RELEASE IRON PO) Take by mouth daily. Only taken for 2 days    . Flibanserin (ADDYI) 100 MG TABS Take 1 tablet by mouth daily. 30 tablet 11   No current facility-administered medications for this visit.      PHYSICAL EXAMINATION: ECOG PERFORMANCE STATUS: 0 - Asymptomatic Vitals:   03/01/19 1402  BP: 113/74  Pulse: 69  Resp: 18  Temp: 97.7 F (36.5 C)   Filed Weights   03/01/19 1402  Weight: 215 lb (97.5 kg)    Physical Exam Constitutional:      General: She is not in acute distress. HENT:     Head: Normocephalic and atraumatic.  Eyes:     General: No scleral icterus.    Pupils: Pupils are equal, round, and reactive to light.  Neck:     Musculoskeletal: Normal range of motion and neck supple.  Cardiovascular:     Rate and Rhythm: Normal rate and regular rhythm.     Heart sounds: Normal heart sounds.   Pulmonary:     Effort: Pulmonary effort is normal. No respiratory distress.     Breath sounds: No wheezing.  Abdominal:     General: Bowel sounds are normal. There is no distension.     Palpations: Abdomen is soft. There is no mass.     Tenderness: There is no abdominal tenderness.  Musculoskeletal: Normal range of motion.        General: No deformity.  Skin:    General: Skin is warm and dry.     Coloration: Skin is not pale.     Findings: No erythema or rash.  Neurological:     Mental Status: She is alert and oriented to person, place, and time.  Cranial Nerves: No cranial nerve deficit.     Coordination: Coordination normal.  Psychiatric:        Behavior: Behavior normal.        Thought Content: Thought content normal.       CMP Latest Ref Rng & Units 01/28/2019  Glucose 65 - 99 mg/dL 76  BUN 6 - 20 mg/dL 8  Creatinine 0.57 - 1.00 mg/dL 0.65  Sodium 134 - 144 mmol/L 141  Potassium 3.5 - 5.2 mmol/L 4.4  Chloride 96 - 106 mmol/L 109(H)  CO2 20 - 29 mmol/L 21  Calcium 8.7 - 10.2 mg/dL 8.8  Total Protein 6.0 - 8.5 g/dL 6.5  Total Bilirubin 0.0 - 1.2 mg/dL 0.2  Alkaline Phos 39 - 117 IU/L 73  AST 0 - 40 IU/L 22  ALT 0 - 32 IU/L 13   CBC Latest Ref Rng & Units 01/28/2019  WBC 3.4 - 10.8 x10E3/uL 3.4  Hemoglobin 11.1 - 15.9 g/dL 6.4(LL)  Hematocrit 34.0 - 46.6 % 24.0(L)  Platelets 150 - 440 K/uL -     LABORATORY DATA:  I have reviewed the data as listed Lab Results  Component Value Date   WBC 3.4 01/28/2019   HGB 6.4 (LL) 01/28/2019   HCT 24.0 (L) 01/28/2019   MCV 59 (L) 01/28/2019   PLT 194 11/19/2014   Recent Labs    01/28/19 0815  NA 141  K 4.4  CL 109*  CO2 21  GLUCOSE 76  BUN 8  CREATININE 0.65  CALCIUM 8.8  GFRNONAA 115  GFRAA 133  PROT 6.5  ALBUMIN 4.1  AST 22  ALT 13  ALKPHOS 73  BILITOT 0.2   Iron/TIBC/Ferritin/ %Sat    Component Value Date/Time   IRON 14 (L) 01/28/2019 0815   TIBC 474 (H) 01/28/2019 0815   FERRITIN 3 (L)  01/28/2019 0815   IRONPCTSAT 3 (LL) 01/28/2019 0815     No results found.    ASSESSMENT & PLAN:  1. Iron deficiency anemia due to chronic blood loss   2. Menorrhagia with regular cycle    Labs are reviewed and discussed with patient. CBC showed stable hemoglobin. Iron panel showed improved iron store.  Still anemic. Recommend additional Venofer weekly x3.    Menorrhagia.  She has ultrasound pelvis scheduled.  Recommend her to follow up  with gynecology No orders of the defined types were placed in this encounter.   All questions were answered. The patient knows to call the clinic with any problems questions or concerns.  Return of visit:  3 months for follow-up and evaluation of additional IV iron.   Earlie Server, MD, PhD Hematology Oncology [facility] Pager- [phone] 03/02/2019

## 2019-03-04 ENCOUNTER — Inpatient Hospital Stay: Payer: Managed Care, Other (non HMO)

## 2019-03-07 ENCOUNTER — Encounter: Payer: Self-pay | Admitting: Psychiatry

## 2019-03-07 ENCOUNTER — Other Ambulatory Visit: Payer: Self-pay

## 2019-03-07 ENCOUNTER — Ambulatory Visit (INDEPENDENT_AMBULATORY_CARE_PROVIDER_SITE_OTHER): Payer: Managed Care, Other (non HMO) | Admitting: Psychiatry

## 2019-03-07 ENCOUNTER — Inpatient Hospital Stay: Payer: Managed Care, Other (non HMO)

## 2019-03-07 VITALS — BP 110/68 | HR 62 | Temp 97.6°F | Resp 18

## 2019-03-07 DIAGNOSIS — D500 Iron deficiency anemia secondary to blood loss (chronic): Secondary | ICD-10-CM | POA: Diagnosis not present

## 2019-03-07 DIAGNOSIS — F411 Generalized anxiety disorder: Secondary | ICD-10-CM | POA: Diagnosis not present

## 2019-03-07 DIAGNOSIS — F330 Major depressive disorder, recurrent, mild: Secondary | ICD-10-CM | POA: Diagnosis not present

## 2019-03-07 DIAGNOSIS — R918 Other nonspecific abnormal finding of lung field: Secondary | ICD-10-CM | POA: Insufficient documentation

## 2019-03-07 DIAGNOSIS — R12 Heartburn: Secondary | ICD-10-CM | POA: Insufficient documentation

## 2019-03-07 MED ORDER — HYDROXYZINE HCL 25 MG PO TABS: 12.5000 mg | ORAL_TABLET | Freq: Three times a day (TID) | ORAL | 1 refills | Status: DC | PRN

## 2019-03-07 MED ORDER — IRON SUCROSE 20 MG/ML IV SOLN
200.0000 mg | Freq: Once | INTRAVENOUS | Status: AC
  Administered 2019-03-07: 200 mg via INTRAVENOUS
  Filled 2019-03-07: qty 10

## 2019-03-07 MED ORDER — SODIUM CHLORIDE 0.9 % IV SOLN
Freq: Once | INTRAVENOUS | Status: AC
  Administered 2019-03-07: 14:00:00 via INTRAVENOUS
  Filled 2019-03-07: qty 250

## 2019-03-07 MED ORDER — GABAPENTIN 100 MG PO CAPS: 100.0000 mg | ORAL_CAPSULE | Freq: Three times a day (TID) | ORAL | 1 refills | Status: DC

## 2019-03-07 NOTE — Progress Notes (Signed)
Pt tolerated infusion well. Pt and VS stable at discharge.  

## 2019-03-07 NOTE — Progress Notes (Signed)
Virtual Visit via Video Note  I connected with Mary Fox on 03/07/19 at  9:00 AM EST by a video enabled telemedicine application and verified that I am speaking with the correct person using two identifiers.   I discussed the limitations of evaluation and management by telemedicine and the availability of in person appointments. The patient expressed understanding and agreed to proceed.     I discussed the assessment and treatment plan with the patient. The patient was provided an opportunity to ask questions and all were answered. The patient agreed with the plan and demonstrated an understanding of the instructions.   The patient was advised to call back or seek an in-person evaluation if the symptoms worsen or if the condition fails to improve as anticipated.  Psychiatric Initial Adult Assessment   Patient Identification: Mary Fox MRN:  [id number] Date of Evaluation:  03/07/2019 Referral Source: Mary Fox   Chief Complaint:   Chief Complaint    Establish Care; Anxiety; Depression     Visit Diagnosis: R/O ADHD   ICD-10-CM   1. GAD (generalized anxiety disorder)  F41.1 gabapentin (NEURONTIN) 100 MG capsule    hydrOXYzine (ATARAX/VISTARIL) 25 MG tablet  2. MDD (major depressive disorder), recurrent episode, mild (HCC)  F33.0 gabapentin (NEURONTIN) 100 MG capsule    History of Present Illness: Mary Fox is a 33 year old Caucasian female, married, employed, lives in [place], has a history of depression, anxiety, iron deficiency anemia, gastroesophageal reflux disease was evaluated by telemedicine today.  Patient reports she has been struggling with depression and anxiety since the age of 33.  She however reports her depressive symptoms as well as anxiety symptoms has been getting worse since the past few months.  Patient reports depressive symptoms of sadness, inability to focus, sleep problems, being withdrawn, irritability, increased appetite which are getting worse  since the past few months.  Patient reports she was started on Cymbalta by her primary care provider and currently she takes 60 mg.  She reports the Cymbalta may have helped to some extent.  Patient reports anxiety symptoms.  She reports she worries all the time and comes up with the worst current scenario with any event in her life.  She constantly worries about different things , for example when her husband goes somewhere he has to call her otherwise she has these thoughts which are so negative and she goes into a panic mode.  She often feels on edge, is restless, nervous.  She also reports anxiety attacks when she has shortness of breath, hyperventilating, is anxious and this can last for 30 minutes or so.  She has to walk away from the situation to feel better.  She also takes Klonopin which she used to take few times a week in the past however recently she has been taking it every night.  If she does not take it she cannot sleep and she has a lot of racing thoughts.  Patient reports mood lability or irritability.  She reports she snaps at people too often.  This has been getting worse since the past few months.  She denies any other manic or hypomanic symptoms at this time.  Patient denies any suicidality, homicidality or perceptual disturbances.  Patient does report a remote history of possible diagnosis of ADHD.  She reports when she was 33 years old her family physician may have told her that she may have ADHD due to her inattention problem at school.  She however reports she did not stay on the  medication for too long.  She does not remember what medication was tried.  She did very well in school.  She will often finish her schoolwork too fast and often gets bored at school.  Hence her parents kept her busy and that went well.  She reports however that recently she has noticed a lot of concentration problems and at work.  This has been getting worse since the past few months.  Patient denies any  history of trauma.  Patient denies any substance abuse problems.  Patient reports good relationship with her husband.  Patient does report psychosocial stressors of work-related problems as well as being in school.  She is trying to get her masters degree at this time.  Associated Signs/Symptoms: Depression Symptoms:  depressed mood, insomnia, psychomotor agitation, fatigue, feelings of worthlessness/guilt, difficulty concentrating, anxiety, disturbed sleep, increased appetite, (Hypo) Manic Symptoms:  Irritable Mood, Anxiety Symptoms:  Excessive Worry, Panic Symptoms, Psychotic Symptoms:  Denies PTSD Symptoms: Negative  Past Psychiatric History: Patient with previous diagnosis of depression, anxiety, episodic mood disorder.  She denies inpatient mental health admissions.  She however was seen by Dr. Garnetta Fox in [facility] clinic [facility] in the past.  Reports she saw a therapist here in this clinic-Mary Fox however was noncompliant with therapy as well as medication management.  Patient denies any suicide attempts.  Most recently her medications were being prescribed by her primary care provider.  Previous Psychotropic Medications: Yes Past trials of multiple medications-Effexor, Seroquel-noncompliant, BuSpar, Lexapro, Paxil, Wellbutrin, Zoloft, Klonopin.  Patient reports these medications were tried for a couple of months and did not help her and hence were switched to another medication.  She does not clearly remember any side effects.  Substance Abuse History in the last 12 months:  No.  Consequences of Substance Abuse: Negative  Past Medical History:  Past Medical History:  Diagnosis Date  . Allergic reaction 05/31/2017  . Anxiety   . Depression   . IDA (iron deficiency anemia) 02/01/2019  . IDA (iron deficiency anemia) 02/01/2019    Past Surgical History:  Procedure Laterality Date  . CESAREAN SECTION  09/05/2016  . CHOLECYSTECTOMY  07 31 2012  . GASTRIC BYPASS      Family  Psychiatric History: Mother, sister-  both have anxiety and depressive disorder.  Family History:  Family History  Problem Relation Age of Onset  . Alcohol abuse Mother   . Anxiety disorder Mother   . Depression Mother   . Alcohol abuse Sister   . Anxiety disorder Sister   . Depression Sister   . Diabetes Neg Hx     Social History:   Social History   Socioeconomic History  . Marital status: Married    Spouse name: benjamin  . Number of children: 2  . Years of education: Not on file  . Highest education level: Master's degree (e.g., MA, MS, MEng, MEd, MSW, MBA)  Occupational History  . Not on file  Social Needs  . Financial resource strain: Not hard at all  . Food insecurity    Worry: Never true    Inability: Never true  . Transportation needs    Medical: No    Non-medical: No  Tobacco Use  . Smoking status: Former Smoker    Quit date: 11/05/2010    Years since quitting: 8.3  . Smokeless tobacco: Never Used  Substance and Sexual Activity  . Alcohol use: Not Currently    Alcohol/week: 2.0 standard drinks    Types: 2 Glasses of wine  per week    Frequency: Never  . Drug use: No  . Sexual activity: Yes    Birth control/protection: None  Lifestyle  . Physical activity    Days per week: 0 days    Minutes per session: 0 min  . Stress: Rather much  Relationships  . Social Musician on phone: Not on file    Gets together: Not on file    Attends religious service: Never    Active member of club or organization: No    Attends meetings of clubs or organizations: Never    Relationship status: Married  Other Topics Concern  . Not on file  Social History Narrative  . Not on file    Additional Social History: Patient currently lives with her husband, twin boys age 41-1/2 years old-her biological children in [place].  She also has a stepson who is 57 years old who lives with them every other week.  She currently works as a Product manager at [employer].  She has  been with them since the past 6 years.  Patient reports she had a good childhood.  She currently is also in school getting her masters degree through online school in [place].  Patient denies any history of trauma.  Allergies:  No Known Allergies  Metabolic Disorder Labs: No results found for: HGBA1C, MPG No results found for: PROLACTIN Lab Results  Component Value Date   CHOL 131 01/28/2019   TRIG 46 01/28/2019   HDL 64 01/28/2019   CHOLHDL 2.0 01/28/2019   LDLCALC 56 01/28/2019   Lab Results  Component Value Date   TSH 1.380 01/28/2019    Therapeutic Level Labs: No results found for: LITHIUM No results found for: CBMZ No results found for: VALPROATE  Current Medications: Current Outpatient Medications  Medication Sig Dispense Refill  . clonazePAM (KLONOPIN) 0.5 MG tablet TAKE 1 TABLET BY MOUTH EVERY DAY AS NEEDED FOR ANXIETY 30 tablet 2  . DULoxetine (CYMBALTA) 60 MG capsule Take 1 capsule (60 mg total) by mouth daily. 90 capsule 3  . Ferrous Sulfate Dried (EQ SLOW-RELEASE IRON PO) Take by mouth daily. Only taken for 2 days    . Flibanserin (ADDYI) 100 MG TABS Take 1 tablet by mouth daily. 30 tablet 11  . gabapentin (NEURONTIN) 100 MG capsule Take 1 capsule (100 mg total) by mouth 3 (three) times daily. 90 capsule 1  . hydrOXYzine (ATARAX/VISTARIL) 25 MG tablet Take 0.5-1 tablets (12.5-25 mg total) by mouth 3 (three) times daily as needed for anxiety. For anxiety, sleep 90 tablet 1   No current facility-administered medications for this visit.     Musculoskeletal: Strength & Muscle Tone: UTA Gait & Station: normal Patient leans: N/A  Psychiatric Specialty Exam: Review of Systems  Psychiatric/Behavioral: Positive for depression. The patient is nervous/anxious and has insomnia.   All other systems reviewed and are negative.   There were no vitals taken for this visit.There is no height or weight on file to calculate BMI.  General Appearance: Casual  Eye Contact:   Fair  Speech:  Clear and Coherent  Volume:  Normal  Mood:  Anxious and Depressed  Affect:  Congruent  Thought Process:  Goal Directed and Descriptions of Associations: Intact  Orientation:  Full (Time, Place, and Person)  Thought Content:  Logical  Suicidal Thoughts:  No  Homicidal Thoughts:  No  Memory:  Immediate;   Fair Recent;   Fair Remote;   Fair  Judgement:  Fair  Insight:  Fair  Psychomotor Activity:  Normal  Concentration:  Concentration: Fair and Attention Span: Fair  Recall:  [employer] of Knowledge:Fair  Language: Fair  Akathisia:  No  Handed:  Right  AIMS (if indicated): Denies tremors, rigidity  Assets:  Communication Skills Desire for Improvement Housing Social Support Talents/Skills Transportation Vocational/Educational  ADL's:  Intact  Cognition: WNL  Sleep:  Poor   Screenings: GAD-7     Office Visit from 12/07/2017 in [facility] Office Visit from 06/12/2015 in [facility] Office Visit from [facility]02/2015 [facility]  Total GAD-7 Score  PHQ2-9     Office Visit from 01/21/2019 in [facility] Office Visit from 02/06/2018 in [facility] Office Visit from 12/07/2017 in [facility] Office Visit from 04/02/2015 in [facility] Office Visit from 11/24/2014 in [place]  PHQ-2 Total Score  0  0  PHQ-9 Total Score  -  -      Assessment and Plan: Ms. Albina Gosney is a 33 year old Caucasian female, married, employed, lives in [place], has a history of depression, anxiety, iron deficiency anemia was evaluated by telemedicine today.  Patient is biologically predisposed given her family history.  Patient also has psychosocial stressors of being in school, work-related problems.  Patient denies any suicidality.  Patient denies any substance abuse problems.  Patient however has failed multiple  medication trials in the past.  Patient does report mood lability however currently does not meet criteria for bipolar disorder.  Patient however does carry a previous diagnosis of episodic mood disorder.  Patient will benefit from medication readjustment as well as psychotherapy sessions.  Plan  Generalized anxiety disorder-unstable Continue Cymbalta 60 mg p.o. daily Start gabapentin 100 mg 3 times a day. Discussed with patient to taper off Klonopin and to limit use.  Advised her to start skipping 1 to 2 days a week this week and then keep adding days over the course of the next few weeks.  Discussed long-term risk of being on benzodiazepine medications. Add hydroxyzine 12.5 to 25 mg p.o. 3 times daily as needed for severe anxiety symptoms.  MDD-unstable Cymbalta as prescribed Refer for CBT. Patient provided with information for therapist in the community including [facility]. Hydroxyzine as needed will help with sleep.  Rule out ADHD-will refer patient for ADHD testing.  Will refer to [place] attention specialist.  Patient with past history of episodic mood disorder per review of medical records in [facility] per Dr. Princella Ion 12/02/2015, 12/15/2015-' patient was started on Seroquel 50 mg as well as venlafaxine XR 75 mg.  Patient was also referred for psychotherapy sessions at that visit.'  We will continue to monitor closely.  Patient currently does not meet criteria for bipolar disorder.  I have reviewed labs in [facility]-TSH-01/28/2019-within normal limits.  Follow-up in clinic in 2 weeks or sooner if needed.  December 7 at 9 AM  I have spent atleast 60 minutes non face to face with patient today. More than 50 % of the time was spent for psychoeducation and supportive psychotherapy and care coordination. This note was generated in part or whole with voice recognition software. Voice recognition is usually quite accurate but there are transcription errors that can and very often do  occur. I apologize for any typographical errors  that were not detected and corrected.       Ursula Alert, MD 11/19/202010:01 AM

## 2019-03-12 ENCOUNTER — Other Ambulatory Visit: Payer: Self-pay

## 2019-03-12 ENCOUNTER — Inpatient Hospital Stay: Payer: Managed Care, Other (non HMO)

## 2019-03-12 VITALS — BP 128/79 | HR 66 | Temp 97.9°F | Resp 18

## 2019-03-12 DIAGNOSIS — D500 Iron deficiency anemia secondary to blood loss (chronic): Secondary | ICD-10-CM

## 2019-03-12 MED ORDER — IRON SUCROSE 20 MG/ML IV SOLN
200.0000 mg | Freq: Once | INTRAVENOUS | Status: AC
  Administered 2019-03-12: 200 mg via INTRAVENOUS
  Filled 2019-03-12: qty 10

## 2019-03-12 MED ORDER — SODIUM CHLORIDE 0.9 % IV SOLN
Freq: Once | INTRAVENOUS | Status: AC
  Administered 2019-03-12: 14:00:00 via INTRAVENOUS
  Filled 2019-03-12: qty 250

## 2019-03-25 ENCOUNTER — Other Ambulatory Visit: Payer: Self-pay

## 2019-03-25 ENCOUNTER — Ambulatory Visit (INDEPENDENT_AMBULATORY_CARE_PROVIDER_SITE_OTHER): Payer: 59 | Admitting: Psychiatry

## 2019-03-25 ENCOUNTER — Encounter: Payer: Self-pay | Admitting: Psychiatry

## 2019-03-25 DIAGNOSIS — F411 Generalized anxiety disorder: Secondary | ICD-10-CM | POA: Diagnosis not present

## 2019-03-25 DIAGNOSIS — F330 Major depressive disorder, recurrent, mild: Secondary | ICD-10-CM

## 2019-03-25 MED ORDER — GABAPENTIN 300 MG PO CAPS: 300.0000 mg | ORAL_CAPSULE | Freq: Two times a day (BID) | ORAL | 0 refills | Status: DC

## 2019-03-25 NOTE — Progress Notes (Signed)
Virtual Visit via Video Note  I connected with Mary Fox on 03/25/19 at  9:00 AM EST by a video enabled telemedicine application and verified that I am speaking with the correct person using two identifiers.   I discussed the limitations of evaluation and management by telemedicine and the availability of in person appointments. The patient expressed understanding and agreed to proceed.     I discussed the assessment and treatment plan with the patient. The patient was provided an opportunity to ask questions and all were answered. The patient agreed with the plan and demonstrated an understanding of the instructions.   The patient was advised to call back or seek an in-person evaluation if the symptoms worsen or if the condition fails to improve as anticipated.  [place] MD OP Progress Note  03/25/2019 9:18 AM Mary Fox  MRN:  [id number]  Chief Complaint:  Chief Complaint    Follow-up     HPI: [name] is a 33 year old Caucasian female, married, employed, lives in [place], has a history of depression, anxiety, iron deficiency anemia, gastroesophageal reflux disease was evaluated by telemedicine today.  Patient today reports she has noticed improvement in her mood symptoms.  She denies any frequent crying spells and they have improved.  She reports she continues to be anxious however her anxiety has also improved.   She feels more positive.  She reports sleep is good.  Hydroxyzine as needed does help.  She reports work is going well.  She denies any suicidality, homicidality or perceptual disturbances.  She reports she stopped taking the Klonopin since her last visit with Probation officer.  She denies any withdrawal symptoms or increased anxiety due to not being on it.  She reports she was able to establish care with [facility].  She has upcoming appointment.  She also having appointment for ADHD testing at [place] [facility].  She is tolerating gabapentin well.  She  denies side effects.  Patient denies any other concerns today.   Visit Diagnosis: R/O ADHD   ICD-10-CM   1. GAD (generalized anxiety disorder)  F41.1 gabapentin (NEURONTIN) 300 MG capsule  2. MDD (major depressive disorder), recurrent episode, mild (HCC)  F33.0 gabapentin (NEURONTIN) 300 MG capsule    Past Psychiatric History: I have reviewed past psychiatric history from my progress note on 03/07/2019.  BuSpar, Effexor, Seroquel-noncompliant Lexapro, Paxil, Wellbutrin, Zoloft, Klonopin.  Past Medical History:  Past Medical History:  Diagnosis Date  . Allergic reaction 05/31/2017  . Anxiety   . Depression   . IDA (iron deficiency anemia) 02/01/2019  . IDA (iron deficiency anemia) 02/01/2019    Past Surgical History:  Procedure Laterality Date  . CESAREAN SECTION  09/05/2016  . CHOLECYSTECTOMY  07 31 2012  . GASTRIC BYPASS      Family Psychiatric History: I have reviewed family psychiatric history from my progress note on 03/07/2019.  Family History:  Family History  Problem Relation Age of Onset  . Alcohol abuse Mother   . Anxiety disorder Mother   . Depression Mother   . Alcohol abuse Sister   . Anxiety disorder Sister   . Depression Sister   . Diabetes Neg Hx     Social History: Reviewed social history from my progress note on 03/07/2019. Social History   Socioeconomic History  . Marital status: Married    Spouse name: Mary Fox  . Number of children: 2  . Years of education: Not on file  . Highest education level: Master's degree (e.g., MA, MS, MEng,  MEd, MSW, MBA)  Occupational History  . Not on file  Social Needs  . Financial resource strain: Not hard at all  . Food insecurity    Worry: Never true    Inability: Never true  . Transportation needs    Medical: No    Non-medical: No  Tobacco Use  . Smoking status: Former Smoker    Quit date: 11/05/2010    Years since quitting: 8.3  . Smokeless tobacco: Never Used  Substance and Sexual Activity  .  Alcohol use: Not Currently    Alcohol/week: 2.0 standard drinks    Types: 2 Glasses of wine per week    Frequency: Never  . Drug use: No  . Sexual activity: Yes    Birth control/protection: None  Lifestyle  . Physical activity    Days per week: 0 days    Minutes per session: 0 min  . Stress: Rather much  Relationships  . Social Musician on phone: Not on file    Gets together: Not on file    Attends religious service: Never    Active member of club or organization: No    Attends meetings of clubs or organizations: Never    Relationship status: Married  Other Topics Concern  . Not on file  Social History Narrative  . Not on file    Allergies: No Known Allergies  Metabolic Disorder Labs: No results found for: HGBA1C, MPG No results found for: PROLACTIN Lab Results  Component Value Date   CHOL 131 01/28/2019   TRIG 46 01/28/2019   HDL 64 01/28/2019   CHOLHDL 2.0 01/28/2019   LDLCALC 56 01/28/2019   Lab Results  Component Value Date   TSH 1.380 01/28/2019   TSH 3.965 11/19/2014    Therapeutic Level Labs: No results found for: LITHIUM No results found for: VALPROATE No components found for:  CBMZ  Current Medications: Current Outpatient Medications  Medication Sig Dispense Refill  . clonazePAM (KLONOPIN) 0.5 MG tablet TAKE 1 TABLET BY MOUTH EVERY DAY AS NEEDED FOR ANXIETY 30 tablet 2  . DULoxetine (CYMBALTA) 60 MG capsule Take 1 capsule (60 mg total) by mouth daily. 90 capsule 3  . ferrous sulfate 325 (65 FE) MG tablet Take 325 mg by mouth.    . Ferrous Sulfate Dried (EQ SLOW-RELEASE IRON PO) Take by mouth daily. Only taken for 2 days    . Flibanserin (ADDYI) 100 MG TABS Take 1 tablet by mouth daily. 30 tablet 11  . gabapentin (NEURONTIN) 300 MG capsule Take 1 capsule (300 mg total) by mouth 2 (two) times daily. 180 capsule 0  . hydrOXYzine (ATARAX/VISTARIL) 25 MG tablet Take 0.5-1 tablets (12.5-25 mg total) by mouth 3 (three) times daily as needed for  anxiety. For anxiety, sleep 90 tablet 1   No current facility-administered medications for this visit.      Musculoskeletal: Strength & Muscle Tone: UTA Gait & Station: normal Patient leans: N/A  Psychiatric Specialty Exam: Review of Systems  Psychiatric/Behavioral: The patient is nervous/anxious.   All other systems reviewed and are negative.   There were no vitals taken for this visit.There is no height or weight on file to calculate BMI.  General Appearance: Casual  Eye Contact:  Fair  Speech:  Clear and Coherent  Volume:  Normal  Mood:  Anxious  Affect:  Congruent  Thought Process:  Goal Directed and Descriptions of Associations: Intact  Orientation:  Full (Time, Place, and Person)  Thought Content: Logical  Suicidal Thoughts:  No  Homicidal Thoughts:  No  Memory:  Immediate;   Fair Recent;   Fair Remote;   Fair  Judgement:  Fair  Insight:  Fair  Psychomotor Activity:  Normal  Concentration:  Concentration: Fair and Attention Span: Fair  Recall:  [employer]Fair  Fund of Knowledge: Fair  Language: Fair  Akathisia:  No  Handed:  Right  AIMS (if indicated): Denies tremors, rigidity  Assets:  Communication Skills Desire for Improvement Housing Intimacy Social Support Talents/Skills Transportation Vocational/Educational  ADL's:  Intact  Cognition: WNL  Sleep:  Fair   Screenings: GAD-7     [facility] Office Visit from [facility]/2015 in [facility] Visit from [facility]/02/2015 in [facility]  Total GAD-7 Score  14  11  18     PHQ2-9     [facility]/21/2019 in [facility] Office Visit from [facility]/06/2018 [facility] Office Visit from [facility]/07/2017 in [facility] Office Visit from [facility]/02/2015 [facility]  PHQ-2 Total Score  1  2  3   0  0  PHQ-9 Total  Score  5  16  15   -  -       Assessment and Plan: Morrie Sheldonshley is a 33 year old Caucasian female, married, employed, lives in [place], has a history of depression, anxiety, iron deficiency anemia was evaluated by telemedicine today.  She is biologically predisposed given her history of family mental health problems.  She also has psychosocial stressors of being in school, work-related problems.  Patient currently however is making progress however will continue to benefit from medication readjustment.  She will also benefit from psychotherapy sessions.  Plan as noted below.  Plan GAD-improving Cymbalta 60 mg p.o. daily Increase gabapentin to 300 mg p.o. twice daily. Continue to limit Klonopin-she reports she stopped taking it 2 weeks ago. Hydroxyzine 12.5 to 25 mg p.o. 3 times daily as needed for severe anxiety symptoms  MDD-improving Cymbalta as prescribed Patient has established care with [facility] for [facility].   Rule out ADHD-she has an appointment with [facility] attention specialist for ADHD testing.  Follow-up in clinic in 1 month or sooner if needed.  January 5 at 3 PM  I have spent atleast 15 minutes non face to face with patient today. More than 50 % of the time was spent for psychoeducation and supportive psychotherapy and care coordination. This note was generated in part or whole with voice recognition software. Voice recognition is usually quite accurate but there are transcription errors that can and very often do occur. I apologize for any typographical errors that were not detected and corrected.      Jomarie LongsSaramma Dickie Labarre, MD 03/25/2019, 9:18 AM

## 2019-04-23 ENCOUNTER — Ambulatory Visit (INDEPENDENT_AMBULATORY_CARE_PROVIDER_SITE_OTHER): Payer: 59 | Admitting: Psychiatry

## 2019-04-23 ENCOUNTER — Other Ambulatory Visit: Payer: Self-pay

## 2019-04-23 ENCOUNTER — Encounter: Payer: Self-pay | Admitting: Psychiatry

## 2019-04-23 DIAGNOSIS — F330 Major depressive disorder, recurrent, mild: Secondary | ICD-10-CM

## 2019-04-23 DIAGNOSIS — F411 Generalized anxiety disorder: Secondary | ICD-10-CM

## 2019-04-23 MED ORDER — HYDROXYZINE HCL 25 MG PO TABS: 12.5000 mg | ORAL_TABLET | Freq: Three times a day (TID) | ORAL | 1 refills | Status: DC | PRN

## 2019-04-23 NOTE — Progress Notes (Signed)
Virtual Visit via Video Note  I connected with Mary Fox on 04/23/19 at  3:00 PM EST by a video enabled telemedicine application and verified that I am speaking with the correct person using two identifiers.   I discussed the limitations of evaluation and management by telemedicine and the availability of in person appointments. The patient expressed understanding and agreed to proceed.     I discussed the assessment and treatment plan with the patient. The patient was provided an opportunity to ask questions and all were answered. The patient agreed with the plan and demonstrated an understanding of the instructions.   The patient was advised to call back or seek an in-person evaluation if the symptoms worsen or if the condition fails to improve as anticipated.   BH MD OP Progress Note  04/23/2019 5:25 PM Mary Fox  MRN:  [id number]  Chief Complaint:  Chief Complaint    Follow-up     HPI: Mary Fox is a 34 year old Caucasian female, married, employed, lives in [place], has a history of depression, anxiety, iron deficiency anemia, gastroesophageal reflux disease was evaluated by telemedicine today.  Patient today reports her mood symptoms have improved on the current medication regimen.  She denies any significant irritability or mood swings at this time.  She denies crying spells.  Patient reports sleep is good.  Patient reports her anxiety symptoms is improved.  She likes the effect of gabapentin.  She continues to take the Cymbalta.  She reports she had ADHD testing done with [place] attention specialist.  She reports she was told that she does not have ADHD and may have symptoms of OCD.  Some time was spent evaluating patient for OCD however patient currently does not meet criteria for the same and does not report any obsessions or compulsions other than her anxiety at work.  Patient reports she has not been able to start psychotherapy sessions and is currently waiting for a  call back from [facility] counseling center.  Discussed with patient she can be referred to therapist here in our office.  Patient denies any suicidality, homicidality or perceptual disturbances.  Patient denies any other concerns today. Visit Diagnosis:    ICD-10-CM   1. GAD (generalized anxiety disorder)  F41.1 hydrOXYzine (ATARAX/VISTARIL) 25 MG tablet  2. MDD (major depressive disorder), recurrent episode, mild (HCC)  F33.0     Past Psychiatric History: I have reviewed past psychiatric history from my progress note on 03/07/2019.  Past trials of BuSpar, Effexor, Seroquel-noncompliant, Lexapro, Paxil, Wellbutrin, Zoloft, Klonopin  Past Medical History:  Past Medical History:  Diagnosis Date  . Allergic reaction 05/31/2017  . Anxiety   . Depression   . IDA (iron deficiency anemia) 02/01/2019  . IDA (iron deficiency anemia) 02/01/2019    Past Surgical History:  Procedure Laterality Date  . CESAREAN SECTION  09/05/2016  . CHOLECYSTECTOMY  07 31 2012  . GASTRIC BYPASS      Family Psychiatric History: Reviewed family psychiatric history from my progress note on 03/07/2019.  Family History:  Family History  Problem Relation Age of Onset  . Alcohol abuse Mother   . Anxiety disorder Mother   . Depression Mother   . Alcohol abuse Sister   . Anxiety disorder Sister   . Depression Sister   . Diabetes Neg Hx     Social History: Reviewed social history from my progress note on 03/07/2019. Social History   Socioeconomic History  . Marital status: Married    Spouse name: benjamin  .  Number of children: 2  . Years of education: Not on file  . Highest education level: Master's degree (e.g., MA, MS, MEng, MEd, MSW, MBA)  Occupational History  . Not on file  Tobacco Use  . Smoking status: Former Smoker    Quit date: 11/05/2010    Years since quitting: 8.4  . Smokeless tobacco: Never Used  Substance and Sexual Activity  . Alcohol use: Not Currently    Alcohol/week: 2.0 standard  drinks    Types: 2 Glasses of wine per week  . Drug use: No  . Sexual activity: Yes    Birth control/protection: None  Other Topics Concern  . Not on file  Social History Narrative  . Not on file   Social Determinants of Health   Financial Resource Strain: Low Risk   . Difficulty of Paying Living Expenses: Not hard at all  Food Insecurity: No Food Insecurity  . Worried About Charity fundraiser in the Last Year: Never true  . Ran Out of Food in the Last Year: Never true  Transportation Needs: No Transportation Needs  . Lack of Transportation (Medical): No  . Lack of Transportation (Non-Medical): No  Physical Activity: Inactive  . Days of Exercise per Week: 0 days  . Minutes of Exercise per Session: 0 min  Stress: Stress Concern Present  . Feeling of Stress : Rather much  Social Connections: Unknown  . Frequency of Communication with Friends and Family: Not on file  . Frequency of Social Gatherings with Friends and Family: Not on file  . Attends Religious Services: Never  . Active Member of Clubs or Organizations: No  . Attends Archivist Meetings: Never  . Marital Status: Married    Allergies: No Known Allergies  Metabolic Disorder Labs: No results found for: HGBA1C, MPG No results found for: PROLACTIN Lab Results  Component Value Date   CHOL 131 01/28/2019   TRIG 46 01/28/2019   HDL 64 01/28/2019   CHOLHDL 2.0 01/28/2019   LDLCALC 56 01/28/2019   Lab Results  Component Value Date   TSH 1.380 01/28/2019   TSH 3.965 11/19/2014    Therapeutic Level Labs: No results found for: LITHIUM No results found for: VALPROATE No components found for:  CBMZ  Current Medications: Current Outpatient Medications  Medication Sig Dispense Refill  . DULoxetine (CYMBALTA) 60 MG capsule Take 1 capsule (60 mg total) by mouth daily. 90 capsule 3  . ferrous sulfate 325 (65 FE) MG tablet Take 325 mg by mouth.    . Ferrous Sulfate Dried (EQ SLOW-RELEASE IRON PO) Take by  mouth daily. Only taken for 2 days    . Flibanserin (ADDYI) 100 MG TABS Take 1 tablet by mouth daily. 30 tablet 11  . gabapentin (NEURONTIN) 300 MG capsule Take 1 capsule (300 mg total) by mouth 2 (two) times daily. 180 capsule 0  . hydrOXYzine (ATARAX/VISTARIL) 25 MG tablet Take 0.5-1 tablets (12.5-25 mg total) by mouth 3 (three) times daily as needed for anxiety. For anxiety, sleep 90 tablet 1   No current facility-administered medications for this visit.     Musculoskeletal: Strength & Muscle Tone: UTA Gait & Station: normal Patient leans: N/A  Psychiatric Specialty Exam: Review of Systems  Psychiatric/Behavioral: The patient is nervous/anxious.   All other systems reviewed and are negative.   There were no vitals taken for this visit.There is no height or weight on file to calculate BMI.  General Appearance: Casual  Eye Contact:  Fair  Speech:  Clear and Coherent  Volume:  Normal  Mood:  Anxious  Affect:  Congruent  Thought Process:  Goal Directed and Descriptions of Associations: Intact  Orientation:  Full (Time, Place, and Person)  Thought Content: Logical   Suicidal Thoughts:  No  Homicidal Thoughts:  No  Memory:  Immediate;   Fair Recent;   Fair Remote;   Fair  Judgement:  Fair  Insight:  Fair  Psychomotor Activity:  Normal  Concentration:  Concentration: Fair and Attention Span: Fair  Recall:  [employer] of Knowledge: Fair  Language: Fair  Akathisia:  No  Handed:  Right  AIMS (if indicated):Denies tremors, rigidity  Assets:  Communication Skills Desire for Improvement Housing Social Support  ADL's:  Intact  Cognition: WNL  Sleep:  Fair   Screenings: GAD-7     Office Visit from 12/07/2017 in [facility] [place] Office Visit from 06/12/2015 in [facility] Office Visit from 04/02/2015 in [facility]  Total GAD-7 Score  14  11  18     PHQ2-9     Office Visit from 01/21/2019 in [facility]  Office Visit from 02/06/2018 in [facility] [place] Office Visit from 12/07/2017 in [facility] [place] Office Visit from 04/02/2015 in [facility] Office Visit from 11/24/2014 in [place]  PHQ-2 Total Score  1  2  3   0  0  PHQ-9 Total Score  5  16  15   --  --       Assessment and Plan: Charlese is a 34 year old Caucasian female, married, employed, lives in [place], has a history of depression, anxiety, iron deficiency anemia was evaluated by telemedicine today.  She is biologically predisposed given her history of family mental health problems.  She also has psychosocial stressors of being in school, work-related problems.  She is currently making progress on the current medication regimen.  She will benefit from psychotherapy sessions.  Plan as noted below.  Plan GAD-improving Cymbalta 60 mg p.o. daily Gabapentin 300 mg p.o. twice daily Hydroxyzine 12.5 to 25 mg p.o. 3 times daily as needed  MDD-improving Cymbalta as prescribed We will refer patient to our referral coordinator Ms. Morrie Sheldon to schedule for psychotherapy sessions. Patient is still waiting to get a call back from [facility] counseling center.  Rule out ADHD-patient was referred to [place] attention specialist.  Patient today reports she had her testing done.  However we have not received her report yet.  I have sent a message to CMA to contact him for the report.  Follow-up in clinic in 1 month or sooner if needed.  February 12 at 11:20 AM  I have spent atleast 20 minutes non face to face with patient today. More than 50 % of the time was spent for ordering medications and test ,psychoeducation and supportive psychotherapy and care coordination,as well as documenting clinical information in electronic health record.   This note was generated in part or whole with voice recognition software. Voice recognition is usually quite accurate but there are transcription errors that  can and very often do occur. I apologize for any typographical errors that were not detected and corrected.        [facility], MD 04/23/2019, 5:25 PM

## 2019-04-25 ENCOUNTER — Ambulatory Visit: Payer: Managed Care, Other (non HMO) | Admitting: Obstetrics and Gynecology

## 2019-04-29 ENCOUNTER — Ambulatory Visit (INDEPENDENT_AMBULATORY_CARE_PROVIDER_SITE_OTHER): Payer: Managed Care, Other (non HMO)

## 2019-04-29 ENCOUNTER — Encounter: Payer: Self-pay | Admitting: Obstetrics and Gynecology

## 2019-04-29 ENCOUNTER — Other Ambulatory Visit: Payer: Self-pay

## 2019-04-29 ENCOUNTER — Ambulatory Visit (INDEPENDENT_AMBULATORY_CARE_PROVIDER_SITE_OTHER): Payer: Managed Care, Other (non HMO) | Admitting: Obstetrics and Gynecology

## 2019-04-29 VITALS — BP 140/82 | Ht 69.0 in | Wt 228.0 lb

## 2019-04-29 DIAGNOSIS — N920 Excessive and frequent menstruation with regular cycle: Secondary | ICD-10-CM | POA: Diagnosis not present

## 2019-04-29 DIAGNOSIS — N83202 Unspecified ovarian cyst, left side: Secondary | ICD-10-CM | POA: Diagnosis not present

## 2019-04-29 MED ORDER — TRANEXAMIC ACID 650 MG PO TABS: 1300.0000 mg | ORAL_TABLET | Freq: Three times a day (TID) | ORAL | 6 refills | Status: DC

## 2019-04-29 NOTE — Progress Notes (Signed)
Patient ID: Mary Fox, female   DOB: Apr 03, 1986, 34 y.o.   MRN: [id number]  Reason for Consult: Follow-up (F/u on anemia )   Referred by Glori Luis, MD  Subjective:     HPI:  Mary Fox is a 34 y.o. female. She is following up today for menorrhagia. She was going to have a pelvic US before, but it was delayed because of a covid exposure. She has followed up with hematology. She received IV iron transfusions and has been taking oral IRON. She feels like her anemia has improved. She has increased energy. She has had continued heavy menstrual bleeding. She does not desire to use oral medications or LARCs for medical management of her menstrual cycle. She has a history significant for a uterine fibroid removed at the time of cesarean section. She also reports that when she was undergoing IVF she always had a thickened endometrium which would sometimes prevent her from being able to have treatment.   She reports that she has been happy with using ADDYI and would like to continue with this medication.   She reports that she is no longer considering having another child.    Past Medical History:  Diagnosis Date  . Allergic reaction 05/31/2017  . Anxiety   . Depression   . IDA (iron deficiency anemia) 02/01/2019  . IDA (iron deficiency anemia) 02/01/2019   Family History  Problem Relation Age of Onset  . Alcohol abuse Mother   . Anxiety disorder Mother   . Depression Mother   . Alcohol abuse Sister   . Anxiety disorder Sister   . Depression Sister   . Diabetes Neg Hx    Past Surgical History:  Procedure Laterality Date  . CESAREAN SECTION  09/05/2016  . CHOLECYSTECTOMY  07 31 2012  . GASTRIC BYPASS      Short Social History:  Social History   Tobacco Use  . Smoking status: Former Smoker    Quit date: 11/05/2010    Years since quitting: 8.4  . Smokeless tobacco: Never Used  Substance Use Topics  . Alcohol use: Not Currently    Alcohol/week: 2.0 standard drinks   Types: 2 Glasses of wine per week    No Known Allergies  Current Outpatient Medications  Medication Sig Dispense Refill  . DULoxetine (CYMBALTA) 60 MG capsule Take 1 capsule (60 mg total) by mouth daily. 90 capsule 3  . ferrous sulfate 325 (65 FE) MG tablet Take 325 mg by mouth.    . Ferrous Sulfate Dried (EQ SLOW-RELEASE IRON PO) Take by mouth daily. Only taken for 2 days    . Flibanserin (ADDYI) 100 MG TABS Take 1 tablet by mouth daily. 30 tablet 11  . gabapentin (NEURONTIN) 300 MG capsule Take 1 capsule (300 mg total) by mouth 2 (two) times daily. 180 capsule 0  . hydrOXYzine (ATARAX/VISTARIL) 25 MG tablet Take 0.5-1 tablets (12.5-25 mg total) by mouth 3 (three) times daily as needed for anxiety. For anxiety, sleep 90 tablet 1   No current facility-administered medications for this visit.    Review of Systems  Constitutional: Negative for chills, fatigue, fever and unexpected weight change.  HENT: Negative for trouble swallowing.  Eyes: Negative for loss of vision.  Respiratory: Negative for cough, shortness of breath and wheezing.  Cardiovascular: Negative for chest pain, leg swelling, palpitations and syncope.  GI: Negative for abdominal pain, blood in stool, diarrhea, nausea and vomiting.  GU: Negative for difficulty urinating, dysuria, frequency and hematuria.  Musculoskeletal:  Negative for back pain, leg pain and joint pain.  Skin: Negative for rash.  Neurological: Negative for dizziness, headaches, light-headedness, numbness and seizures.  Psychiatric: Negative for behavioral problem, confusion, depressed mood and sleep disturbance.        Objective:  Objective   Vitals:   04/29/19 1052  BP: 140/82  Weight: 228 lb (103.4 kg)  Height: 5\' 9"  (1.753 m)   Body mass index is 33.67 kg/m.  Physical Exam Vitals and nursing note reviewed.  Constitutional:      Appearance: She is well-developed.  HENT:     Head: Normocephalic and atraumatic.  Cardiovascular:     Rate  and Rhythm: Normal rate and regular rhythm.  Pulmonary:     Effort: Pulmonary effort is normal.     Breath sounds: Normal breath sounds.  Abdominal:     General: Bowel sounds are normal.     Palpations: Abdomen is soft.  Musculoskeletal:        General: Normal range of motion.  Skin:    General: Skin is warm and dry.  Neurological:     Mental Status: She is alert and oriented to person, place, and time.  Psychiatric:        Behavior: Behavior normal.        Thought Content: Thought content normal.        Judgment: Judgment normal.         Assessment/Plan:     34 yo with heavy menorrhagia with regular cycle.  Pelvic US this afternoon showed a heterogeneous thickened endometrium will plan a hysteroscopy D&C for a date in the next 1-4 months depending on surgical availability due to COVID-19.  Will manage bleeding at this time with Lysteda for 5 days during her period.  She will continue with oral iron supplementation.     More than 25 minutes were spent face to face with the patient in the room with more than 50% of the time spent providing counseling and discussing the plan of management.   Adrian Prows MD [facility] 04/29/2019 7:49 PM     Will return for pelvic US this afternoon

## 2019-04-30 DIAGNOSIS — Z6835 Body mass index (BMI) 35.0-35.9, adult: Secondary | ICD-10-CM | POA: Insufficient documentation

## 2019-04-30 DIAGNOSIS — Z9884 Bariatric surgery status: Secondary | ICD-10-CM | POA: Insufficient documentation

## 2019-04-30 DIAGNOSIS — R1013 Epigastric pain: Secondary | ICD-10-CM | POA: Insufficient documentation

## 2019-05-01 ENCOUNTER — Other Ambulatory Visit: Payer: Self-pay | Admitting: Psychiatry

## 2019-05-01 DIAGNOSIS — F330 Major depressive disorder, recurrent, mild: Secondary | ICD-10-CM

## 2019-05-01 DIAGNOSIS — F411 Generalized anxiety disorder: Secondary | ICD-10-CM

## 2019-05-02 ENCOUNTER — Telehealth: Payer: Self-pay | Admitting: Obstetrics and Gynecology

## 2019-05-02 NOTE — Telephone Encounter (Signed)
-----   Message from Natale Milch, MD sent at 04/29/2019  9:36 PM EST ----- Surgery Booking Request Patient Full Name:  Mary Fox  MRN: [id number]  DOB: September 22, 1985  Surgeon: Natale Milch, MD  Requested Surgery Date and Time: ASAP Primary Diagnosis AND Code: Thickened endometrium N85.00 Secondary Diagnosis and Code:  Surgical Procedure: Hysteroscopy D&C L&D Notification: No Admission Status: same day surgery Length of Surgery: 1 hour Special Case Needs: No H&P: No Phone Interview???:  Yes Interpreter: No Language:  Medical Clearance:  No Special Scheduling Instructions: none Any known health/anesthesia issues, diabetes, sleep apnea, latex allergy, defibrillator/pacemaker?: No Acuity: P3   (P1 highest, P2 delay may cause harm, P3 low, elective gyn, P4 lowest)

## 2019-05-02 NOTE — Telephone Encounter (Signed)
Lmtrc

## 2019-05-03 ENCOUNTER — Other Ambulatory Visit: Payer: Self-pay

## 2019-05-03 ENCOUNTER — Ambulatory Visit (INDEPENDENT_AMBULATORY_CARE_PROVIDER_SITE_OTHER): Payer: 59 | Admitting: Clinical

## 2019-05-03 DIAGNOSIS — F411 Generalized anxiety disorder: Secondary | ICD-10-CM

## 2019-05-03 DIAGNOSIS — F330 Major depressive disorder, recurrent, mild: Secondary | ICD-10-CM

## 2019-05-03 NOTE — Progress Notes (Signed)
Virtual Visit via Video Note  I connected with Mary Fox on 05/03/19 at 10:00 AM EST by a video enabled telemedicine application and verified that I am speaking with the correct person using two identifiers.  Location: Patient: Home  Provider: Office   I discussed the limitations of evaluation and management by telemedicine and the availability of in person appointments. The patient expressed understanding and agreed to proceed.         Comprehensive Clinical Assessment (CCA) Note  05/03/2019 Mary Fox [id number]  Visit Diagnosis:      ICD-10-CM   1. MDD (major depressive disorder), recurrent episode, mild (HCC)  F33.0   2. GAD (generalized anxiety disorder)  F41.1       CCA Part One  Part One has been completed on paper by the patient.  (See scanned document in Chart Review)  CCA Part Two A  Intake/Chief Complaint:  CCA Intake With Chief Complaint CCA Part Two Date: 05/03/19 Chief Complaint/Presenting Problem: The patient notes, " I need help dealing with emotions and handling stress and not binge eating". Patients Currently Reported Symptoms/Problems: The patient notes, " I feel overwhelmed, crying alot, eating alot, cant handle things". Collateral Involvement: None Individual's Strengths: Timeliness, cleanliness, taking care of others. Individual's Preferences: The patient notes, " Sleeping watching my twin 31yr olds, TV". Individual's Abilities: The patient notes, " Doing Puzzels". Type of Services Patient Feels Are Needed: Medication Therapy and Talk Therapy ( Currently prescribed Gabapentin and Cymbalta) Initial Clinical Notes/Concerns: No Additional  Mental Health Symptoms Depression:  Depression: Change in energy/activity, Fatigue, Difficulty Concentrating, Tearfulness, Increase/decrease in appetite, Weight gain/loss, Irritability, Hopelessness  Mania:  Mania: N/A  Anxiety:   Anxiety: Difficulty concentrating, Restlessness, Irritability, Fatigue, Tension,  Worrying  Psychosis:  Psychosis: N/A  Trauma:  Trauma: N/A  Obsessions:  Obsessions: N/A  Compulsions:  Compulsions: N/A  Inattention:  Inattention: N/A  Hyperactivity/Impulsivity:  Hyperactivity/Impulsivity: N/A  Oppositional/Defiant Behaviors:  Oppositional/Defiant Behaviors: N/A  Borderline Personality:  Emotional Irregularity: N/A  Other Mood/Personality Symptoms:  Other Mood/Personality Symtpoms: None noted   Mental Status Exam Appearance and self-care  Stature:  Stature: Average  Weight:  Weight: Average weight  Clothing:  Clothing: Casual  Grooming:  Grooming: Normal  Cosmetic use:  Cosmetic Use: Age appropriate  Posture/gait:  Posture/Gait: Normal  Motor activity:  Motor Activity: Not Remarkable  Sensorium  Attention:  Attention: Normal  Concentration:  Concentration: Anxiety interferes  Orientation:  Orientation: X5  Recall/memory:  Recall/Memory: Normal  Affect and Mood  Affect:  Affect: Appropriate  Mood:  Mood: Anxious  Relating  Eye contact:  Eye Contact: Normal  Facial expression:  Facial Expression: Responsive  Attitude toward examiner:  Attitude Toward Examiner: Cooperative  Thought and Language  Speech flow: Speech Flow: Normal  Thought content:  Thought Content: Appropriate to mood and circumstances  Preoccupation:  Preoccupations: (None noted)  Hallucinations:  Hallucinations: (None noted)  Organization:   Logical   Company secretary of Knowledge:  Fund of Knowledge: Average  Intelligence:  Intelligence: Average  Abstraction:  Abstraction: Normal  Judgement:  Judgement: Normal  Reality Testing:  Reality Testing: Realistic  Insight:  Insight: Good  Decision Making:  Decision Making: Normal  Social Functioning  Social Maturity:  Social Maturity: Responsible  Social Judgement:  Social Judgement: Normal  Stress  Stressors:  Stressors: (None noted)  Coping Ability:  Coping Ability: Normal  Skill Deficits:   None noted   Supports:   Family     Family and Psychosocial  History: Family history Marital status: Married Number of Years Married: 12 What types of issues is patient dealing with in the relationship?: Patient notes no difficulty with her partner Additional relationship information: No Additional Are you sexually active?: Yes What is your sexual orientation?: Heterosexual Has your sexual activity been affected by drugs, alcohol, medication, or emotional stress?: No Does patient have children?: Yes How many children?: 2 How is patient's relationship with their children?: The patient notes she has 2 1/36 yr old twin boys and this relationship is normal  Childhood History:  Childhood History By whom was/is the patient raised?: Both parents Additional childhood history information: No Additional Description of patient's relationship with caregiver when they were a child: The patient notes, " My relationship with my parents was good". Patient's description of current relationship with people who raised him/her: The patient notes,  " We call and talk on a weekly basis, we just live further apart". How were you disciplined when you got in trouble as a child/adolescent?: The patient notes, " Whippings,timeouts, and things being taken away". Does patient have siblings?: Yes Number of Siblings: 1 Description of patient's current relationship with siblings: The patient notes, "Currently its good, it hasnt always been good". Did patient suffer any verbal/emotional/physical/sexual abuse as a child?: No Did patient suffer from severe childhood neglect?: No Has patient ever been sexually abused/assaulted/raped as an adolescent or adult?: No Was the patient ever a victim of a crime or a disaster?: No Witnessed domestic violence?: No Has patient been effected by domestic violence as an adult?: No  CCA Part Two B  Employment/Work Situation: Employment / Work Copywriter, advertising Employment situation: Employed Where is patient currently  employed?: [employer] long has patient been employed?: 8yrs Patient's job has been impacted by current illness: No What is the longest time patient has a held a job?: current employer Where was the patient employed at that time?: current employer ([employer]) Did You Receive Any Psychiatric Treatment/Services While in the [employer]?: No Are There Guns or Other Weapons in [place]?: No Are These Weapons Safely Secured?: No Who Could Verify You Are Able To Have These Secured:: NA  Education: Education School Currently Attending: [employer]ourses in [employer] - working towards  [place] in [place] Grade Completed: 12 Name of [place]: [place] Did Teacher, adult education From [employer]?: Yes Did Physicist, medical?: Yes What Type of College Degree Do you Have?: The patient notes a BA in [employer] and [employer] Did You Attend Graduate School?: Yes What is Your Teacher, English as a foreign language Degree?: Currently working on a Masters in [employer] What Was Your Major?: NA Did You Have Any Chief Technology Officer In School?: NA Did You Have An Individualized Education Program (IIEP): No Did You Have Any Difficulty At [employer]?: No  Religion: Religion/Spirituality Are You A Religious Person?: No How Might This Affect Treatment?: No Affect  Leisure/Recreation: Leisure / Recreation Leisure and Hobbies: The patient notes, " Spending time with my family".  Exercise/Diet: Exercise/Diet Do You Exercise?: No Have You Gained or Lost A Significant Amount of Weight in the Past Six Months?: No Do You Follow a Special Diet?: No Do You Have Any Trouble Sleeping?: No  CCA Part Two C  Alcohol/Drug Use: Alcohol / Drug Use Pain Medications: None Prescriptions: See patient chart Over the Counter: Multivitamins History of alcohol / drug use?: No history of alcohol / drug abuse  CCA Part Three  ASAM's:  Six Dimensions of Multidimensional  Assessment  Dimension 1:  Acute Intoxication and/or Withdrawal Potential:     Dimension 2:  Biomedical Conditions and Complications:     Dimension 3:  Emotional, Behavioral, or Cognitive Conditions and Complications:     Dimension 4:  Readiness to Change:     Dimension 5:  Relapse, Continued use, or Continued Problem Potential:     Dimension 6:  Recovery/Living Environment:      Substance use Disorder (SUD)    Social Function:  Social Functioning Social Maturity: Responsible Social Judgement: Normal  Stress:  Stress Stressors: (None noted) Coping Ability: Normal Patient Takes Medications The Way The Doctor Instructed?: Yes Priority Risk: Low Acuity  Risk Assessment- Self-Harm Potential: Risk Assessment For Self-Harm Potential Thoughts of Self-Harm: No current thoughts Method: No plan Availability of Means: No access/NA Additional Comments for Self-Harm Potential: The patient notes no S/I  Risk Assessment -Dangerous to Others Potential: Risk Assessment For Dangerous to Others Potential Method: No Plan Availability of Means: No access or NA Intent: Vague intent or NA Notification Required: No need or identified person Additional Comments for Danger to Others Potential: The patient notes no current H/I  DSM5 Diagnoses: Patient Active Problem List   Diagnosis Date Noted  . Abnormal findings on diagnostic imaging of lung 03/07/2019  . Heartburn 03/07/2019  . MDD (major depressive disorder), recurrent episode, mild (HCC) 03/07/2019  . IDA (iron deficiency anemia) 02/01/2019  . Depression, major, single episode, moderate (HCC) 12/07/2017  . Low back strain 04/13/2017  . Morbid obesity (HCC) 12/01/2016  . GAD (generalized anxiety disorder) 11/24/2014  . 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  Referrals to Alternative Service(s): Referred to Alternative Service(s):   Place:   Date:   Time:    Referred to Alternative Service(s):   Place:   Date:   Time:    Referred to Alternative Service(s):   Place:   Date:   Time:    Referred to Alternative Service(s):   Place:   Date:   Time:     I discussed the assessment and treatment plan with the patient. The patient was provided an opportunity to ask questions and all were answered. The patient agreed with the plan and demonstrated an understanding of the instructions.   The patient was advised to call back or seek an in-person evaluation if the symptoms worsen or if the condition fails to improve as anticipated.  I provided 60 minutes of non-face-to-face time during this encounter.  Winfred Burn , LCSW

## 2019-05-16 NOTE — Telephone Encounter (Signed)
Patient is aware of H&P at [facility] on 2/19 @ 8:50am w/ Dr. Jerene Pitch, Pre-admit testing to be scheduled, COVID testing on 2/23, and OR on 06/13/19. Patient is aware to quarantine after COVID testing. Patient is aware she may receive calls from the [facility] and [facility]. Patient confirmed Rosann Auerbach, and no secondary insurance.

## 2019-05-27 DIAGNOSIS — Z09 Encounter for follow-up examination after completed treatment for conditions other than malignant neoplasm: Secondary | ICD-10-CM | POA: Insufficient documentation

## 2019-05-27 NOTE — Telephone Encounter (Signed)
Orders placed.

## 2019-05-28 ENCOUNTER — Other Ambulatory Visit: Payer: Self-pay

## 2019-05-28 ENCOUNTER — Ambulatory Visit (INDEPENDENT_AMBULATORY_CARE_PROVIDER_SITE_OTHER): Payer: 59 | Admitting: Clinical

## 2019-05-28 DIAGNOSIS — F411 Generalized anxiety disorder: Secondary | ICD-10-CM

## 2019-05-28 DIAGNOSIS — F330 Major depressive disorder, recurrent, mild: Secondary | ICD-10-CM

## 2019-05-28 NOTE — Progress Notes (Signed)
Virtual Visit via Video Note  I connected with Mary Fox on 05/28/19 at  9:00 AM EST by a video enabled telemedicine application and verified that I am speaking with the correct person using two identifiers.  Location: Patient: Home Provider: Office   I discussed the limitations of evaluation and management by telemedicine and the availability of in person appointments. The patient expressed understanding and agreed to proceed.     THERAPIST PROGRESS NOTE Session Time: 9:00AM-9:30AM  Participation Level: Active  Behavioral Response: CasualAlertDepressed  Type of Therapy: Individual Therapy  Treatment Goals addressed: Coping  Interventions: CBT  Summary: Mary Fox is a 34 y.o. female who presents with Depression and Anxiety. The OPT therapist worked with the patient  for her initial session. The OPT therapist utilized Motivational Interviewing to assist in creating therapeutic repore. The patient in the session was engaged and work in collaboration giving feedback about her triggers and symptoms over the past few weeks including balancing her last semester in college and caring for her twins. The OPT therapist utilized Cognitive Behavioral Therapy through cognitive restructuring as well as worked with the patient on coping strategies to assist in management of Anxiety and Depression. The OPT therapist inquired for holistic care about the patients adherence to medication therapy.  Suicidal/Homicidal: Nowithout intent/plan  Therapist Response: The OPT therapist worked with the patient for the patients initial scheduled session. The patient was engaged in her session and gave feedback in relation to triggers, symptoms, and behavior responses over the past 2 weeks. The OPT therapist worked with the patient utilizing an in session Cognitive Behavioral Therapy exercise as well as processed with the patient the importance of better balance in different life role areas. The patient was  responsive in the session and indicated a willingness to not self overload working more for a better overall balance in life roles to further assist in restricting mood triggering. The patient indicated both compliance and effectiveness in relation to her current medication therapy. The OPT therapist will continue treatment work with the patient in her next scheduled session.  Plan: Return again in 3 weeks.  Diagnosis: Axis I: MDD (major depressive disorder), recurrent episode, mild  And GAD (generalized anxiety disorder)    Axis II: No diagnosis  I discussed the assessment and treatment plan with the patient. The patient was provided an opportunity to ask questions and all were answered. The patient agreed with the plan and demonstrated an understanding of the instructions.   The patient was advised to call back or seek an in-person evaluation if the symptoms worsen or if the condition fails to improve as anticipated.  I provided 30 minutes of non-face-to-face time during this encounter.  Winfred Burn, LCSW 05/28/2019

## 2019-05-31 ENCOUNTER — Ambulatory Visit (INDEPENDENT_AMBULATORY_CARE_PROVIDER_SITE_OTHER): Payer: 59 | Admitting: Psychiatry

## 2019-05-31 ENCOUNTER — Inpatient Hospital Stay: Payer: Managed Care, Other (non HMO)

## 2019-05-31 ENCOUNTER — Encounter: Payer: Self-pay | Admitting: Psychiatry

## 2019-05-31 ENCOUNTER — Telehealth: Payer: Self-pay | Admitting: *Deleted

## 2019-05-31 ENCOUNTER — Inpatient Hospital Stay: Payer: Managed Care, Other (non HMO) | Admitting: Oncology

## 2019-05-31 ENCOUNTER — Other Ambulatory Visit: Payer: Self-pay

## 2019-05-31 DIAGNOSIS — F330 Major depressive disorder, recurrent, mild: Secondary | ICD-10-CM | POA: Diagnosis not present

## 2019-05-31 DIAGNOSIS — K9189 Other postprocedural complications and disorders of digestive system: Secondary | ICD-10-CM | POA: Insufficient documentation

## 2019-05-31 DIAGNOSIS — K449 Diaphragmatic hernia without obstruction or gangrene: Secondary | ICD-10-CM

## 2019-05-31 DIAGNOSIS — F411 Generalized anxiety disorder: Secondary | ICD-10-CM | POA: Diagnosis not present

## 2019-05-31 HISTORY — DX: Diaphragmatic hernia without obstruction or gangrene: K44.9

## 2019-05-31 MED ORDER — GABAPENTIN 300 MG PO CAPS: 300.0000 mg | ORAL_CAPSULE | Freq: Three times a day (TID) | ORAL | 0 refills | Status: DC
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
## 2019-05-31 NOTE — Telephone Encounter (Signed)
Per pt request to cancel 05/31/19 MD/Venofer appt and will call back at a later date to R/S.Marland Kitchen

## 2019-06-03 ENCOUNTER — Other Ambulatory Visit: Payer: Self-pay | Admitting: Orthopedic Surgery

## 2019-06-03 DIAGNOSIS — M25512 Pain in left shoulder: Secondary | ICD-10-CM

## 2019-06-06 ENCOUNTER — Telehealth: Payer: Self-pay | Admitting: Obstetrics and Gynecology

## 2019-06-06 NOTE — Telephone Encounter (Signed)
Patient called to reschedule her procedure. Patient is aware of rescheduled H&P on 3/29 @ 8:10am w/ Dr. Jerene Pitch, and OR on 07/25/19.

## 2019-06-07 ENCOUNTER — Inpatient Hospital Stay: Admission: RE | Admit: 2019-06-07 | Payer: Self-pay | Source: Ambulatory Visit

## 2019-06-07 ENCOUNTER — Encounter: Payer: Managed Care, Other (non HMO) | Admitting: Obstetrics and Gynecology

## 2019-06-11 ENCOUNTER — Other Ambulatory Visit: Payer: Managed Care, Other (non HMO)

## 2019-06-18 ENCOUNTER — Ambulatory Visit
Admission: RE | Admit: 2019-06-18 | Discharge: 2019-06-18 | Disposition: A | Discharge status: Home / Self Care | Payer: Managed Care, Other (non HMO) | Source: Ambulatory Visit | Attending: Orthopedic Surgery | Admitting: Orthopedic Surgery

## 2019-06-18 ENCOUNTER — Other Ambulatory Visit: Payer: Self-pay | Admitting: Orthopedic Surgery

## 2019-06-18 ENCOUNTER — Other Ambulatory Visit: Payer: Self-pay

## 2019-06-18 DIAGNOSIS — M25512 Pain in left shoulder: Secondary | ICD-10-CM

## 2019-06-18 MED ORDER — IOHEXOL 180 MG/ML  SOLN
20.0000 mL | Freq: Once | INTRAMUSCULAR | Status: AC
  Administered 2019-06-18: 5 mL via INTRA_ARTICULAR

## 2019-06-18 MED ORDER — LIDOCAINE HCL (PF) 1 % IJ SOLN
10.0000 mL | Freq: Once | INTRAMUSCULAR | Status: AC
  Administered 2019-06-18: 10 mL
  Filled 2019-06-18: qty 10

## 2019-06-18 MED ORDER — SODIUM CHLORIDE (PF) 0.9 % IJ SOLN
20.0000 mL | Freq: Once | INTRAMUSCULAR | Status: AC
  Administered 2019-06-18: 15 mL

## 2019-06-18 MED ORDER — GADOBUTROL 1 MMOL/ML IV SOLN
0.0500 mL | Freq: Once | INTRAVENOUS | Status: AC
  Administered 2019-06-18: 0.05 mL

## 2019-06-27 ENCOUNTER — Ambulatory Visit (HOSPITAL_COMMUNITY): Payer: 59 | Admitting: Clinical

## 2019-06-27 ENCOUNTER — Telehealth (HOSPITAL_COMMUNITY): Payer: Self-pay | Admitting: Clinical

## 2019-06-27 ENCOUNTER — Other Ambulatory Visit: Payer: Self-pay

## 2019-06-27 NOTE — Telephone Encounter (Signed)
The OPT therapist attempted text to session x2 @ 11:00AM and 11:10Am, however, the patient did not respond and missed their scheduled session.

## 2019-07-11 ENCOUNTER — Telehealth: Payer: Self-pay | Admitting: Obstetrics and Gynecology

## 2019-07-11 NOTE — Telephone Encounter (Signed)
Patient needs to reschedule her H&P on 3/29 due to work conflict.

## 2019-07-12 ENCOUNTER — Ambulatory Visit: Payer: 59 | Admitting: Psychiatry

## 2019-07-15 ENCOUNTER — Telehealth: Payer: Self-pay | Admitting: Obstetrics and Gynecology

## 2019-07-15 ENCOUNTER — Encounter: Payer: Managed Care, Other (non HMO) | Admitting: Obstetrics and Gynecology

## 2019-07-15 NOTE — Telephone Encounter (Signed)
Left message for patient to call office back to reschedule her H&P sometime this week with Dr Jerene Pitch. Please let Mickel Fuchs know when she is rescheduled.

## 2019-07-16 ENCOUNTER — Telehealth: Payer: Self-pay | Admitting: Obstetrics and Gynecology

## 2019-07-16 NOTE — Telephone Encounter (Signed)
Left msg for pt to rtn call to schedule H&P

## 2019-07-17 ENCOUNTER — Encounter: Payer: Managed Care, Other (non HMO) | Admitting: Obstetrics and Gynecology

## 2019-07-17 NOTE — Telephone Encounter (Signed)
Called pt and LVM to see if she was coming to her H&P since no one can get a hold of her. I have told [facility] she cannot be contacted.

## 2019-07-17 NOTE — Telephone Encounter (Signed)
L/M for pt on mobile and work v/m to call back to be seen today for H&P w [facility]. I placed her in a 2:50 slot b/c it was open.

## 2019-07-22 ENCOUNTER — Telehealth: Payer: Self-pay | Admitting: Obstetrics and Gynecology

## 2019-07-22 ENCOUNTER — Other Ambulatory Visit: Admission: RE | Admit: 2019-07-22 | Payer: Managed Care, Other (non HMO) | Source: Ambulatory Visit

## 2019-07-22 NOTE — Telephone Encounter (Signed)
Pt called to cancel surgery for 4/8 due to having a sick child with a stomach bug. She adv that she would call me to reschedule once she worked it out with her employer.

## 2019-07-22 NOTE — Telephone Encounter (Signed)
Okay thank you for letting me know 

## 2019-07-23 ENCOUNTER — Encounter: Payer: Self-pay | Admitting: Family Medicine

## 2019-07-23 ENCOUNTER — Telehealth (INDEPENDENT_AMBULATORY_CARE_PROVIDER_SITE_OTHER): Payer: Managed Care, Other (non HMO) | Admitting: Family Medicine

## 2019-07-23 ENCOUNTER — Other Ambulatory Visit: Payer: Self-pay

## 2019-07-23 ENCOUNTER — Telehealth: Payer: Self-pay | Admitting: Psychiatry

## 2019-07-23 ENCOUNTER — Other Ambulatory Visit: Payer: Managed Care, Other (non HMO)

## 2019-07-23 DIAGNOSIS — F330 Major depressive disorder, recurrent, mild: Secondary | ICD-10-CM | POA: Diagnosis not present

## 2019-07-23 DIAGNOSIS — F411 Generalized anxiety disorder: Secondary | ICD-10-CM

## 2019-07-23 DIAGNOSIS — Z9884 Bariatric surgery status: Secondary | ICD-10-CM

## 2019-07-23 NOTE — Progress Notes (Signed)
Virtual Visit via video Note  This visit type was conducted due to national recommendations for restrictions regarding the COVID-19 pandemic (e.g. social distancing).  This format is felt to be most appropriate for this patient at this time.  All issues noted in this document were discussed and addressed.  No physical exam was performed (except for noted visual exam findings with Video Visits).   I connected with Mary Fox today at  8:00 AM EDT by a video enabled telemedicine application or telephone and verified that I am speaking with the correct person using two identifiers. Location patient: car Location provider: work Persons participating in the virtual visit: patient, provider  I discussed the limitations, risks, security and privacy concerns of performing an evaluation and management service by telephone and the availability of in person appointments. I also discussed with the patient that there may be a patient responsible charge related to this service. The patient expressed understanding and agreed to proceed.  Reason for visit: follow-up  HPI: Anxiety/depression: Patient notes some regression of her anxiety and depression recently.  She has been seeing psychiatry and they placed her on gabapentin which was quite helpful initially though she notices more anxiety and some mild depression recently.  She is still on Cymbalta.  She notes there has not been any kind of trigger for this that she can identify other than general stress.  No SI or HI.  Status post gastric bypass: Patient underwent hernia repair and removal of scar tissue.  She was having some abdominal pain though after the surgery has not had any.  She is on omeprazole for 6 months for ulcer prevention.   ROS: See pertinent positives and negatives per HPI.  Past Medical History:  Diagnosis Date  . Allergic reaction 05/31/2017  . Anxiety   . Depression   . IDA (iron deficiency anemia) 02/01/2019  . IDA (iron  deficiency anemia) 02/01/2019    Past Surgical History:  Procedure Laterality Date  . CESAREAN SECTION  09/05/2016  . CHOLECYSTECTOMY  07 31 2012  . GASTRIC BYPASS      Family History  Problem Relation Age of Onset  . Alcohol abuse Mother   . Anxiety disorder Mother   . Depression Mother   . Alcohol abuse Sister   . Anxiety disorder Sister   . Depression Sister   . Diabetes Neg Hx     SOCIAL HX: Former smoker   Current Outpatient Medications:  .  Biotin 10000 MCG TABS, Take 10,000 mcg by mouth daily., Disp: , Rfl:  .  Cholecalciferol (VITAMIN D) 50 MCG (2000 UT) tablet, Take 2,000 Units by mouth daily., Disp: , Rfl:  .  cyanocobalamin 1000 MCG tablet, Take 1,000 mcg by mouth daily. , Disp: , Rfl:  .  DULoxetine (CYMBALTA) 60 MG capsule, Take 1 capsule (60 mg total) by mouth daily. (Patient taking differently: Take 60 mg by mouth at bedtime. ), Disp: 90 capsule, Rfl: 3 .  ferrous sulfate 325 (65 FE) MG tablet, Take 325 mg by mouth daily. , Disp: , Rfl:  .  Flibanserin (ADDYI) 100 MG TABS, Take 1 tablet by mouth daily., Disp: 30 tablet, Rfl: 11 .  gabapentin (NEURONTIN) 300 MG capsule, Take 1 capsule (300 mg total) by mouth 3 (three) times daily., Disp: 270 capsule, Rfl: 0 .  hydrOXYzine (ATARAX/VISTARIL) 25 MG tablet, Take 0.5-1 tablets (12.5-25 mg total) by mouth 3 (three) times daily as needed for anxiety. For anxiety, sleep (Patient taking differently: Take 12.5-25 mg  by mouth at bedtime. ), Disp: 90 tablet, Rfl: 1 .  ibuprofen (ADVIL) 200 MG tablet, Take 400-800 mg by mouth every 6 (six) hours as needed for headache or moderate pain., Disp: , Rfl:  .  Levocarnitine 500 MG TABS, Take 500 mg by mouth daily. , Disp: , Rfl:  .  Multiple Vitamin (MULTIVITAMIN WITH MINERALS) TABS tablet, Take 1 tablet by mouth at bedtime., Disp: , Rfl:  .  omeprazole (PRILOSEC) 20 MG capsule, Take 20 mg by mouth every evening. , Disp: , Rfl:  .  tranexamic acid (LYSTEDA) 650 MG TABS tablet, Take 2  tablets (1,300 mg total) by mouth 3 (three) times daily. Take during menses for a maximum of five days, Disp: 30 tablet, Rfl: 6  EXAM:  VITALS per patient if applicable:  GENERAL: alert, oriented, appears well and in no acute distress  HEENT: atraumatic, conjunttiva clear, no obvious abnormalities on inspection of external nose and ears  NECK: normal movements of the head and neck  LUNGS: on inspection no signs of respiratory distress, breathing rate appears normal, no obvious gross SOB, gasping or wheezing  CV: no obvious cyanosis  MS: moves all visible extremities without noticeable abnormality  PSYCH/NEURO: pleasant and cooperative, speech and thought processing grossly intact  ASSESSMENT AND PLAN:  Discussed the following assessment and plan:  Depression Slight increase recently.  She will continue on Cymbalta and gabapentin.  I will send a message to her psychiatrist for guidance on possibly increasing her gabapentin.  GAD (generalized anxiety disorder) Increased somewhat.  Discussed continuing Cymbalta and gabapentin.  I will send a message to her psychiatrist to determine if we can increase her gabapentin.  Status post bariatric surgery Doing well status post surgery for a hernia and adhesion removal.  She will monitor for pain.  She will continue omeprazole per bariatric surgery.   No orders of the defined types were placed in this encounter.   No orders of the defined types were placed in this encounter.    I discussed the assessment and treatment plan with the patient. The patient was provided an opportunity to ask questions and all were answered. The patient agreed with the plan and demonstrated an understanding of the instructions.   The patient was advised to call back or seek an in-person evaluation if the symptoms worsen or if the condition fails to improve as anticipated.    Marikay Alar, MD

## 2019-07-23 NOTE — Progress Notes (Signed)
Thank you for the information. I have left a message for her to contact me for medication changes.

## 2019-07-23 NOTE — Assessment & Plan Note (Signed)
Slight increase recently.  She will continue on Cymbalta and gabapentin.  I will send a message to her psychiatrist for guidance on possibly increasing her gabapentin.

## 2019-07-23 NOTE — Assessment & Plan Note (Signed)
Increased somewhat.  Discussed continuing Cymbalta and gabapentin.  I will send a message to her psychiatrist to determine if we can increase her gabapentin.

## 2019-07-23 NOTE — Telephone Encounter (Signed)
Attempted to contact patient since I received a message from her primary care doctor Dr. Birdie Sons.  Left a message for patient to call back.

## 2019-07-23 NOTE — Assessment & Plan Note (Signed)
Doing well status post surgery for a hernia and adhesion removal.  She will monitor for pain.  She will continue omeprazole per bariatric surgery.

## 2019-07-23 NOTE — Telephone Encounter (Signed)
-----   Message from Glori Luis, MD sent at 07/23/2019  8:27 AM EDT ----- Hi Dr Elna Breslow, I saw Mrs Normington today for a visit and she noted a regression in her anxiety recently and some minimal depression. She follows up with you in a couple of weeks, though I wanted to make you aware of this and see if there were any medication changes you would recommend prior to your visit with her. Thanks for your help.Marikay Alar

## 2019-07-25 ENCOUNTER — Ambulatory Visit
Admission: RE | Admit: 2019-07-25 | Payer: Managed Care, Other (non HMO) | Source: Home / Self Care | Admitting: Obstetrics and Gynecology

## 2019-07-25 ENCOUNTER — Encounter: Admission: RE | Payer: Self-pay | Source: Home / Self Care

## 2019-07-25 SURGERY — DILATATION AND CURETTAGE /HYSTEROSCOPY
Anesthesia: Choice

## 2019-08-05 ENCOUNTER — Other Ambulatory Visit: Payer: Self-pay

## 2019-08-05 ENCOUNTER — Telehealth (INDEPENDENT_AMBULATORY_CARE_PROVIDER_SITE_OTHER): Payer: Managed Care, Other (non HMO) | Admitting: Psychiatry

## 2019-08-05 ENCOUNTER — Encounter: Payer: Self-pay | Admitting: Psychiatry

## 2019-08-05 DIAGNOSIS — F411 Generalized anxiety disorder: Secondary | ICD-10-CM | POA: Diagnosis not present

## 2019-08-05 DIAGNOSIS — F330 Major depressive disorder, recurrent, mild: Secondary | ICD-10-CM

## 2019-08-05 MED ORDER — BUPROPION HCL ER (XL) 150 MG PO TB24: 150.0000 mg | ORAL_TABLET | Freq: Every day | ORAL | 1 refills | Status: DC

## 2019-08-05 MED ORDER — DULOXETINE HCL 30 MG PO CPEP: 30.0000 mg | ORAL_CAPSULE | Freq: Every day | ORAL | 0 refills | Status: DC

## 2019-08-05 MED ORDER — GABAPENTIN 300 MG PO CAPS: 300.0000 mg | ORAL_CAPSULE | Freq: Three times a day (TID) | ORAL | 0 refills | Status: DC

## 2019-08-05 NOTE — Patient Instructions (Signed)
Follow-up in clinic on May 18 at 1 PM

## 2019-08-05 NOTE — Progress Notes (Signed)
Provider Location : [facility] Patient Location : Work  Virtual Visit via Video Note  I connected with Mary Fox on 08/05/19 at 11:20 AM EDT by a video enabled telemedicine application and verified that I am speaking with the correct person using two identifiers.   I discussed the limitations of evaluation and management by telemedicine and the availability of in person appointments. The patient expressed understanding and agreed to proceed.    I discussed the assessment and treatment plan with the patient. The patient was provided an opportunity to ask questions and all were answered. The patient agreed with the plan and demonstrated an understanding of the instructions.   The patient was advised to call back or seek an in-person evaluation if the symptoms worsen or if the condition fails to improve as anticipated.   BH MD OP Progress Note  08/05/2019 12:06 PM Mary Fox  MRN:  [id number]  Chief Complaint:  Chief Complaint    Follow-up     HPI: Mary Fox is a 34 year old Caucasian female, married, employed, lives in [place], has a history of depression, anxiety, iron deficiency anemia, gastroesophageal reflux disease was evaluated by telemedicine today.  Patient today reports she is currently struggling with irritability, concentration problems as well as racing thoughts.  She reports this has been getting worse since the past 2 to 3 weeks.  She reports her gabapentin did initially help but it stopped working.  She reports she had ADHD testing however was told she does not have ADHD diagnosis.  She however continues to feel overwhelmed with her academic as well as work related projects.  She reports she is unable to focus and this makes her more depressed and anxious.  She denies any suicidality however reports she always feels like even if she goes on med no one will miss her.  She has a lot of negative thoughts.  Patient denies any homicidality or perceptual disturbances.  She has been  noncompliant with psychotherapy sessions and reports she is currently trying to find a new therapist.  She denies any side effects to her medications.  She has tried Wellbutrin in the past and felt like it may not have helped with her mood in the past.  However this was 20 years ago.  She denies any side effects to Wellbutrin.  She is willing to try Wellbutrin again.  Patient denies any other concerns today. Visit Diagnosis:    ICD-10-CM   1. GAD (generalized anxiety disorder)  F41.1 DULoxetine (CYMBALTA) 30 MG capsule    gabapentin (NEURONTIN) 300 MG capsule  2. MDD (major depressive disorder), recurrent episode, mild (HCC)  F33.0 DULoxetine (CYMBALTA) 30 MG capsule    buPROPion (WELLBUTRIN XL) 150 MG 24 hr tablet    gabapentin (NEURONTIN) 300 MG capsule    Past Psychiatric History: I have reviewed past psychiatric history from my progress note on 03/07/2019.  Past trials of BuSpar, Effexor, Seroquel-noncompliant, Lexapro, Paxil, Wellbutrin, Zoloft, Klonopin  Past Medical History:  Past Medical History:  Diagnosis Date  . Allergic reaction 05/31/2017  . Anxiety   . Depression   . IDA (iron deficiency anemia) 02/01/2019  . IDA (iron deficiency anemia) 02/01/2019    Past Surgical History:  Procedure Laterality Date  . CESAREAN SECTION  09/05/2016  . CHOLECYSTECTOMY  07 31 2012  . GASTRIC BYPASS      Family Psychiatric History: I have reviewed family psychiatric history from my progress note on 03/07/2019  Family History:  Family History  Problem Relation Age of  Onset  . Alcohol abuse Mother   . Anxiety disorder Mother   . Depression Mother   . Alcohol abuse Sister   . Anxiety disorder Sister   . Depression Sister   . Diabetes Neg Hx     Social History: Reviewed social history from my progress note on 03/07/2019 Social History   Socioeconomic History  . Marital status: Married    Spouse name: benjamin  . Number of children: 2  . Years of education: Not on file  .  Highest education level: Master's degree (e.g., MA, MS, MEng, MEd, MSW, MBA)  Occupational History  . Not on file  Tobacco Use  . Smoking status: Former Smoker    Quit date: 11/05/2010    Years since quitting: 8.7  . Smokeless tobacco: Never Used  Substance and Sexual Activity  . Alcohol use: Not Currently    Alcohol/week: 2.0 standard drinks    Types: 2 Glasses of wine per week  . Drug use: No  . Sexual activity: Yes    Birth control/protection: None  Other Topics Concern  . Not on file  Social History Narrative  . Not on file   Social Determinants of Health   Financial Resource Strain: Low Risk   . Difficulty of Paying Living Expenses: Not hard at all  Food Insecurity: No Food Insecurity  . Worried About Charity fundraiser in the Last Year: Never true  . Ran Out of Food in the Last Year: Never true  Transportation Needs: No Transportation Needs  . Lack of Transportation (Medical): No  . Lack of Transportation (Non-Medical): No  Physical Activity: Inactive  . Days of Exercise per Week: 0 days  . Minutes of Exercise per Session: 0 min  Stress: Stress Concern Present  . Feeling of Stress : Rather much  Social Connections: Unknown  . Frequency of Communication with Friends and Family: Not on file  . Frequency of Social Gatherings with Friends and Family: Not on file  . Attends Religious Services: Never  . Active Member of Clubs or Organizations: No  . Attends Archivist Meetings: Never  . Marital Status: Married    Allergies: No Known Allergies  Metabolic Disorder Labs: No results found for: HGBA1C, MPG No results found for: PROLACTIN Lab Results  Component Value Date   CHOL 131 01/28/2019   TRIG 46 01/28/2019   HDL 64 01/28/2019   CHOLHDL 2.0 01/28/2019   LDLCALC 56 01/28/2019   Lab Results  Component Value Date   TSH 1.380 01/28/2019   TSH 3.965 11/19/2014    Therapeutic Level Labs: No results found for: LITHIUM No results found for:  VALPROATE No components found for:  CBMZ  Current Medications: Current Outpatient Medications  Medication Sig Dispense Refill  . progesterone (PROMETRIUM) 100 MG capsule Take 1 capsule (100 mg total) vaginally twice daily; start three days after ovulation.    . Biotin 10000 MCG TABS Take 10,000 mcg by mouth daily.    Marland Kitchen buPROPion (WELLBUTRIN XL) 150 MG 24 hr tablet Take 1 tablet (150 mg total) by mouth daily. 30 tablet 1  . Cholecalciferol (VITAMIN D) 50 MCG (2000 UT) tablet Take 2,000 Units by mouth daily.    . cyanocobalamin 1000 MCG tablet Take 1,000 mcg by mouth daily.     . DULoxetine (CYMBALTA) 30 MG capsule Take 1 capsule (30 mg total) by mouth daily. 90 capsule 0  . ferrous sulfate 325 (65 FE) MG tablet Take 325 mg by mouth daily.     Marland Kitchen  Flibanserin (ADDYI) 100 MG TABS Take 1 tablet by mouth daily. 30 tablet 11  . gabapentin (NEURONTIN) 300 MG capsule Take 1 capsule (300 mg total) by mouth 3 (three) times daily. 270 capsule 0  . hydrOXYzine (ATARAX/VISTARIL) 25 MG tablet Take 0.5-1 tablets (12.5-25 mg total) by mouth 3 (three) times daily as needed for anxiety. For anxiety, sleep (Patient taking differently: Take 12.5-25 mg by mouth at bedtime. ) 90 tablet 1  . ibuprofen (ADVIL) 200 MG tablet Take 400-800 mg by mouth every 6 (six) hours as needed for headache or moderate pain.    . Levocarnitine 500 MG TABS Take 500 mg by mouth daily.     . Multiple Vitamin (MULTIVITAMIN WITH MINERALS) TABS tablet Take 1 tablet by mouth at bedtime.    Marland Kitchen omeprazole (PRILOSEC) 20 MG capsule Take 20 mg by mouth every evening.     . tranexamic acid (LYSTEDA) 650 MG TABS tablet Take 2 tablets (1,300 mg total) by mouth 3 (three) times daily. Take during menses for a maximum of five days 30 tablet 6   No current facility-administered medications for this visit.     Musculoskeletal: Strength & Muscle Tone: UTA Gait & Station: normal Patient leans: N/A  Psychiatric Specialty Exam: Review of Systems   Psychiatric/Behavioral: Positive for decreased concentration. The patient is nervous/anxious.   All other systems reviewed and are negative.   There were no vitals taken for this visit.There is no height or weight on file to calculate BMI.  General Appearance: Casual  Eye Contact:  Fair  Speech:  Clear and Coherent  Volume:  Normal  Mood:  Anxious and Irritable  Affect:  Congruent  Thought Process:  Goal Directed and Descriptions of Associations: Intact  Orientation:  Full (Time, Place, and Person)  Thought Content: Rumination   Suicidal Thoughts:  No  Homicidal Thoughts:  No  Memory:  Immediate;   Fair Recent;   Fair Remote;   Fair  Judgement:  Fair  Insight:  Fair  Psychomotor Activity:  Normal  Concentration:  Concentration: Fair and Attention Span: Fair  Recall:  [employer] of Knowledge: Fair  Language: Fair  Akathisia:  No  Handed:  Right  AIMS (if indicated): UTA  Assets:  Communication Skills Desire for Improvement Housing Social Support Talents/Skills Transportation Vocational/Educational  ADL's:  Intact  Cognition: WNL  Sleep:  Fair   Screenings: GAD-7     Office Visit from 12/07/2017 in [facility] Office Visit from 06/12/2015 in [facility] Office Visit from 04/02/2015 in [facility]  Total GAD-7 Score  14  11  18     PHQ2-9     Video Visit from 07/23/2019 in [facility] Office Visit from 01/21/2019 in [facility] Office Visit from 02/06/2018 in [facility] Office Visit from 12/07/2017 in [facility] Office Visit from 04/02/2015 in [place]  PHQ-2 Total Score  0  1  2  3   0  PHQ-9 Total Score  --  5  16  15   --       Assessment and Plan: Mary Fox is a 34 year old Caucasian female, married, employed, lives in [place], has a history of depression, anxiety, iron deficiency anemia was evaluated by telemedicine  today.  Patient is biologically predisposed given her history of mental health problems in her family.  She also has psychosocial stressors of being in school, work-related problems as well as recent  health issues.  Patient is currently struggling with mood lability, racing thoughts and lack of concentration.  Patient had ADHD testing done which per report came back negative however writer have not received the report yet.  Discussed plan as noted below.  Plan GAD-unstable Reduce Cymbalta to 30 mg p.o. daily Continue gabapentin 300 mg p.o. 3 times daily Start Wellbutrin XL 150 mg p.o. daily for her lack of concentration and mood symptoms. Continue hydroxyzine 12.5 to 25 mg p.o. 3 times daily as needed Patient has been noncompliant with psychotherapy sessions-discussed with patient to establish with a new therapist.  Provided her psychology today information.  MDD-unstable Reduce Cymbalta to 30 mg p.o. daily Add Wellbutrin XL 150 mg p.o. daily.  She has tried it in the past and tolerated it well. Establish care with a new psychotherapist.  Rule out ADHD-patient was referred to [place] [facility].  Pending report.  However per patient report she was told she does not have ADHD.  Follow-up in clinic in 3 to 4 weeks or sooner if needed.  I have spent atleast 20 minutes non face to face with patient today. More than 50 % of the time was spent for preparing to see the patient ( e.g., review of test, records ), ordering medications and test ,psychoeducation and supportive psychotherapy and care coordination,as well as documenting clinical information in electronic health record. This note was generated in part or whole with voice recognition software. Voice recognition is usually quite accurate but there are transcription errors that can and very often do occur. I apologize for any typographical errors that were not detected and corrected.     Jomarie Longs, MD 08/05/2019, 12:06 PM

## 2019-08-27 ENCOUNTER — Other Ambulatory Visit: Payer: Self-pay | Admitting: Psychiatry

## 2019-08-27 DIAGNOSIS — F330 Major depressive disorder, recurrent, mild: Secondary | ICD-10-CM

## 2019-08-27 DIAGNOSIS — F411 Generalized anxiety disorder: Secondary | ICD-10-CM

## 2019-09-03 ENCOUNTER — Encounter: Payer: Self-pay | Admitting: Psychiatry

## 2019-09-03 ENCOUNTER — Other Ambulatory Visit: Payer: Self-pay

## 2019-09-03 ENCOUNTER — Telehealth (INDEPENDENT_AMBULATORY_CARE_PROVIDER_SITE_OTHER): Payer: 59 | Admitting: Psychiatry

## 2019-09-03 DIAGNOSIS — F411 Generalized anxiety disorder: Secondary | ICD-10-CM | POA: Diagnosis not present

## 2019-09-03 DIAGNOSIS — F3342 Major depressive disorder, recurrent, in full remission: Secondary | ICD-10-CM | POA: Diagnosis not present

## 2019-09-03 MED ORDER — BUPROPION HCL ER (XL) 150 MG PO TB24: 150.0000 mg | ORAL_TABLET | Freq: Every day | ORAL | 0 refills | Status: DC

## 2019-09-03 NOTE — Progress Notes (Signed)
Provider Location : [facility] Patient Location : Work  Virtual Visit via Video Note  I connected with Mary Fox on 09/03/19 at  1:00 PM EDT by a video enabled telemedicine application and verified that I am speaking with the correct person using two identifiers.   I discussed the limitations of evaluation and management by telemedicine and the availability of in person appointments. The patient expressed understanding and agreed to proceed.    I discussed the assessment and treatment plan with the patient. The patient was provided an opportunity to ask questions and all were answered. The patient agreed with the plan and demonstrated an understanding of the instructions.   The patient was advised to call back or seek an in-person evaluation if the symptoms worsen or if the condition fails to improve as anticipated.   BH MD OP Progress Note  09/03/2019 1:23 PM Mary Fox  MRN:  [id number]  Chief Complaint:  Chief Complaint    Follow-up     HPI: Mary Fox is a 34 year old Caucasian female, married, employed, lives in [place], has a history of depression, anxiety, iron deficiency anemia, gastroesophageal reflux disease was evaluated by telemedicine today.  Patient today reports she graduated a week ago.  She reports she is very excited about that.  Now that she has more time to herself she does not feel overwhelmed.  She feels much better with regards to her anxiety.  She reports she is able to focus better.  She reports she was more productive at work the past 1 month.  She reports she is tolerating the Wellbutrin well.  She denies side effects.  She reports the Wellbutrin has made a difference.  She denies any significant obsessions or compulsions.  She reports her anxiety symptoms are more under control.  She reports sleep as good.  Patient denies any suicidality, homicidality or perceptual disturbances.  Patient denies any other concerns today.  Visit Diagnosis:    ICD-10-CM    1. GAD (generalized anxiety disorder)  F41.1   2. MDD (major depressive disorder), recurrent, in full remission (HCC)  F33.42 buPROPion (WELLBUTRIN XL) 150 MG 24 hr tablet    Past Psychiatric History: I have reviewed past psychiatric history from my progress note on 03/07/2019.  Past trials of BuSpar, Effexor, Seroquel-noncompliant, Lexapro, Paxil, Wellbutrin, Zoloft, Klonopin  Past Medical History:  Past Medical History:  Diagnosis Date  . Allergic reaction 05/31/2017  . Anxiety   . Depression   . IDA (iron deficiency anemia) 02/01/2019  . IDA (iron deficiency anemia) 02/01/2019    Past Surgical History:  Procedure Laterality Date  . CESAREAN SECTION  09/05/2016  . CHOLECYSTECTOMY  07 31 2012  . GASTRIC BYPASS      Family Psychiatric History: I have reviewed family psychiatric history from my progress note on 03/07/2019  Family History:  Family History  Problem Relation Age of Onset  . Alcohol abuse Mother   . Anxiety disorder Mother   . Depression Mother   . Alcohol abuse Sister   . Anxiety disorder Sister   . Depression Sister   . Diabetes Neg Hx     Social History: I have reviewed social history from my progress note on 03/07/2019 Social History   Socioeconomic History  . Marital status: Married    Spouse name: benjamin  . Number of children: 2  . Years of education: Not on file  . Highest education level: Master's degree (e.g., MA, MS, MEng, MEd, MSW, MBA)  Occupational History  .  Not on file  Tobacco Use  . Smoking status: Former Smoker    Quit date: 11/05/2010    Years since quitting: 8.8  . Smokeless tobacco: Never Used  Substance and Sexual Activity  . Alcohol use: Not Currently    Alcohol/week: 2.0 standard drinks    Types: 2 Glasses of wine per week  . Drug use: No  . Sexual activity: Yes    Birth control/protection: None  Other Topics Concern  . Not on file  Social History Narrative  . Not on file   Social Determinants of Health    Financial Resource Strain: Low Risk   . Difficulty of Paying Living Expenses: Not hard at all  Food Insecurity: No Food Insecurity  . Worried About Programme researcher, broadcasting/film/video in the Last Year: Never true  . Ran Out of Food in the Last Year: Never true  Transportation Needs: No Transportation Needs  . Lack of Transportation (Medical): No  . Lack of Transportation (Non-Medical): No  Physical Activity: Inactive  . Days of Exercise per Week: 0 days  . Minutes of Exercise per Session: 0 min  Stress: Stress Concern Present  . Feeling of Stress : Rather much  Social Connections: Unknown  . Frequency of Communication with Friends and Family: Not on file  . Frequency of Social Gatherings with Friends and Family: Not on file  . Attends Religious Services: Never  . Active Member of Clubs or Organizations: No  . Attends Banker Meetings: Never  . Marital Status: Married    Allergies: No Known Allergies  Metabolic Disorder Labs: No results found for: HGBA1C, MPG No results found for: PROLACTIN Lab Results  Component Value Date   CHOL 131 01/28/2019   TRIG 46 01/28/2019   HDL 64 01/28/2019   CHOLHDL 2.0 01/28/2019   LDLCALC 56 01/28/2019   Lab Results  Component Value Date   TSH 1.380 01/28/2019   TSH 3.965 11/19/2014    Therapeutic Level Labs: No results found for: LITHIUM No results found for: VALPROATE No components found for:  CBMZ  Current Medications: Current Outpatient Medications  Medication Sig Dispense Refill  . Biotin 74944 MCG TABS Take 10,000 mcg by mouth daily.    Marland Kitchen buPROPion (WELLBUTRIN XL) 150 MG 24 hr tablet Take 1 tablet (150 mg total) by mouth daily. 90 tablet 0  . Cholecalciferol (VITAMIN D) 50 MCG (2000 UT) tablet Take 2,000 Units by mouth daily.    . cyanocobalamin 1000 MCG tablet Take 1,000 mcg by mouth daily.     . DULoxetine (CYMBALTA) 30 MG capsule Take 1 capsule (30 mg total) by mouth daily. 90 capsule 0  . ferrous sulfate 325 (65 FE) MG  tablet Take 325 mg by mouth daily.     . Flibanserin (ADDYI) 100 MG TABS Take 1 tablet by mouth daily. 30 tablet 11  . gabapentin (NEURONTIN) 300 MG capsule TAKE 1 CAPSULE(300 MG) BY MOUTH THREE TIMES DAILY 270 capsule 0  . hydrOXYzine (ATARAX/VISTARIL) 25 MG tablet Take 0.5-1 tablets (12.5-25 mg total) by mouth 3 (three) times daily as needed for anxiety. For anxiety, sleep (Patient taking differently: Take 12.5-25 mg by mouth at bedtime. ) 90 tablet 1  . ibuprofen (ADVIL) 200 MG tablet Take 400-800 mg by mouth every 6 (six) hours as needed for headache or moderate pain.    . Levocarnitine 500 MG TABS Take 500 mg by mouth daily.     . Multiple Vitamin (MULTIVITAMIN WITH MINERALS) TABS tablet Take 1  tablet by mouth at bedtime.    Marland Kitchen omeprazole (PRILOSEC) 20 MG capsule Take 20 mg by mouth every evening.     . progesterone (PROMETRIUM) 100 MG capsule Take 1 capsule (100 mg total) vaginally twice daily; start three days after ovulation.    . tranexamic acid (LYSTEDA) 650 MG TABS tablet Take 2 tablets (1,300 mg total) by mouth 3 (three) times daily. Take during menses for a maximum of five days 30 tablet 6   No current facility-administered medications for this visit.     Musculoskeletal: Strength & Muscle Tone: UTA Gait & Station: normal Patient leans: N/A  Psychiatric Specialty Exam: Review of Systems  Psychiatric/Behavioral: The patient is nervous/anxious.   All other systems reviewed and are negative.   There were no vitals taken for this visit.There is no height or weight on file to calculate BMI.  General Appearance: Casual  Eye Contact:  Fair  Speech:  Clear and Coherent  Volume:  Normal  Mood:  Anxious Improving  Affect:  Appropriate  Thought Process:  Goal Directed and Descriptions of Associations: Intact  Orientation:  Full (Time, Place, and Person)  Thought Content: Logical   Suicidal Thoughts:  No  Homicidal Thoughts:  No  Memory:  Immediate;   Fair Recent;    Fair Remote;   Fair  Judgement:  Fair  Insight:  Fair  Psychomotor Activity:  Normal  Concentration:  Concentration: Fair and Attention Span: Fair  Recall:  [employer] of Knowledge: Fair  Language: Fair  Akathisia:  No  Handed:  Right  AIMS (if indicated): UTA  Assets:  Communication Skills Desire for Improvement Housing Social Support  ADL's:  Intact  Cognition: WNL  Sleep:  Fair   Screenings: GAD-7     Office Visit from 12/07/2017 in [place] Office Visit from 06/12/2015 in [place] Office Visit from 04/02/2015 in [facility]  Total GAD-7 Score  14  11  18     PHQ2-9     Video Visit from 07/23/2019 in [facility] Office Visit from 01/21/2019 in [facility] Office Visit from 02/06/2018 in [place] Office Visit from 12/07/2017 in [place] Office Visit from 04/02/2015 in [place]  PHQ-2 Total Score  0  1  2  3   0  PHQ-9 Total Score  --  5  16  15   --       Assessment and Plan: Mary Fox is a 34 year old Caucasian female, married, employed, lives in [place], has a history of depression, anxiety, iron deficiency anemia was evaluated by telemedicine today.  Patient is biologically predisposed given her history of mental health problems in her family.  Patient with psychosocial stressors of the pandemic, work-related stressors.  Patient however is currently making progress.  Plan as noted below.  Plan GAD-improving Cymbalta 30 mg p.o. daily Gabapentin 300 mg p.o. 3 times daily Wellbutrin XL 150 mg p.o. daily. Hydroxyzine 12.5 to 25 mg p.o. 3 times daily as needed Patient has started psychotherapy sessions.  MDD-in full remission Cymbalta 30 mg p.o. daily Wellbutrin XL 150 mg p.o. daily Continue psychotherapy sessions  I have reviewed medical records from [place] tension specialist-ADHD testing-completed on  04/04/2019' patient does not meet criteria for ADHD.  Her attention and focus symptoms are due to her underlying OCD symptoms.  She spends a lot of time on every task trying to make sure it is perfect  and gets behind and is mentally exhausted.'  Follow-up in clinic in 8 weeks or sooner if needed.  I have spent atleast 20 minutes non face to face with patient today. More than 50 % of the time was spent for preparing to see the patient ( e.g., review of test, records ), obtaining and to review and separately obtained history , ordering medications and test ,psychoeducation and supportive psychotherapy and care coordination,as well as documenting clinical information in electronic health record. This note was generated in part or whole with voice recognition software. Voice recognition is usually quite accurate but there are transcription errors that can and very often do occur. I apologize for any typographical errors that were not detected and corrected.       Jomarie Longs, MD 09/03/2019, 1:23 PM

## 2019-09-29 ENCOUNTER — Other Ambulatory Visit: Payer: Self-pay | Admitting: Psychiatry

## 2019-09-29 DIAGNOSIS — F3342 Major depressive disorder, recurrent, in full remission: Secondary | ICD-10-CM

## 2019-09-30 ENCOUNTER — Other Ambulatory Visit: Payer: Self-pay | Admitting: Psychiatry

## 2019-09-30 DIAGNOSIS — F411 Generalized anxiety disorder: Secondary | ICD-10-CM

## 2019-09-30 DIAGNOSIS — F330 Major depressive disorder, recurrent, mild: Secondary | ICD-10-CM

## 2019-11-11 ENCOUNTER — Encounter: Payer: Self-pay | Admitting: Psychiatry

## 2019-11-11 ENCOUNTER — Other Ambulatory Visit: Payer: Self-pay

## 2019-11-11 ENCOUNTER — Telehealth (INDEPENDENT_AMBULATORY_CARE_PROVIDER_SITE_OTHER): Payer: 59 | Admitting: Psychiatry

## 2019-11-11 DIAGNOSIS — F3342 Major depressive disorder, recurrent, in full remission: Secondary | ICD-10-CM | POA: Diagnosis not present

## 2019-11-11 DIAGNOSIS — F411 Generalized anxiety disorder: Secondary | ICD-10-CM | POA: Diagnosis not present

## 2019-11-11 MED ORDER — BUPROPION HCL ER (XL) 150 MG PO TB24: 150.0000 mg | ORAL_TABLET | Freq: Every day | ORAL | 0 refills | Status: DC

## 2019-11-11 MED ORDER — DULOXETINE HCL 30 MG PO CPEP: 30.0000 mg | ORAL_CAPSULE | Freq: Every day | ORAL | 0 refills | Status: DC

## 2019-11-11 MED ORDER — HYDROXYZINE HCL 25 MG PO TABS: 12.5000 mg | ORAL_TABLET | Freq: Three times a day (TID) | ORAL | 1 refills | Status: DC | PRN

## 2019-11-11 NOTE — Progress Notes (Signed)
Provider Location : [facility] Patient Location :[place]  Virtual Visit via Video Note  I connected with Mary BellingAshley Lortz on 11/11/19 at  2:20 PM EDT by a video enabled telemedicine application and verified that I am speaking with the correct person using two identifiers.   I discussed the limitations of evaluation and management by telemedicine and the availability of in person appointments. The patient expressed understanding and agreed to proceed.     I discussed the assessment and treatment plan with the patient. The patient was provided an opportunity to ask questions and all were answered. The patient agreed with the plan and demonstrated an understanding of the instructions.   The patient was advised to call back or seek an in-person evaluation if the symptoms worsen or if the condition fails to improve as anticipated.  BH MD OP Progress Note  11/11/2019 2:53 PM Mary Fox Lightcap  MRN:  [id number]  Chief Complaint:  Chief Complaint    Follow-up     HPI: Mary Fox Puzzo is a 34 year old Caucasian female, married, employed, lives in [place], has a history of depression, anxiety, iron deficiency anemia, gastroesophageal reflux disease was evaluated by telemedicine today.  Patient today reports she is currently doing well with regards to her mood symptoms.  She denies any significant anxiety.  Her depressive symptoms continues to be in remission.  She reports she continues to need hydroxyzine every night for sleep.  She reports she tried skipping the dosage here and there however she has difficulty falling asleep when she does that.  She is compliant on all her other medications.  Since being on the Wellbutrin she has noticed a huge difference.  Patient reports she continues to follow-up with her therapist by phone, provided by her employer.  She reports therapy sessions as beneficial.  Patient reports work is going well.  Patient denies any suicidality, homicidality or perceptual  disturbances.  She denies any significant obsessions or compulsions and reports she is able to walk away from her work and other situations without spending too much time obsessing over it and trying to make everything perfect.  Patient denies any other concerns today.  Visit Diagnosis:    ICD-10-CM   1. GAD (generalized anxiety disorder)  F41.1 DULoxetine (CYMBALTA) 30 MG capsule    hydrOXYzine (ATARAX/VISTARIL) 25 MG tablet  2. MDD (major depressive disorder), recurrent, in full remission (HCC)  F33.42 buPROPion (WELLBUTRIN XL) 150 MG 24 hr tablet    Past Psychiatric History: I have reviewed past psychiatric history from my progress note on 03/07/2019.  Past trials of BuSpar, Effexor, Seroquel-noncompliant, Lexapro, Paxil, Wellbutrin, Zoloft, Klonopin  Past Medical History:  Past Medical History:  Diagnosis Date  . Allergic reaction 05/31/2017  . Anxiety   . Depression   . IDA (iron deficiency anemia) 02/01/2019  . IDA (iron deficiency anemia) 02/01/2019    Past Surgical History:  Procedure Laterality Date  . CESAREAN SECTION  09/05/2016  . CHOLECYSTECTOMY  07 31 2012  . GASTRIC BYPASS      Family Psychiatric History: I have reviewed family psychiatric history from my progress note on 03/07/2019  Family History:  Family History  Problem Relation Age of Onset  . Alcohol abuse Mother   . Anxiety disorder Mother   . Depression Mother   . Alcohol abuse Sister   . Anxiety disorder Sister   . Depression Sister   . Diabetes Neg Hx     Social History: Reviewed social history from my progress note on 03/07/2019 Social History  Socioeconomic History  . Marital status: Married    Spouse name: benjamin  . Number of children: 2  . Years of education: Not on file  . Highest education level: Master's degree (e.g., MA, MS, MEng, MEd, MSW, MBA)  Occupational History  . Not on file  Tobacco Use  . Smoking status: Former Smoker    Quit date: 11/05/2010    Years since  quitting: 9.0  . Smokeless tobacco: Never Used  Vaping Use  . Vaping Use: Never used  Substance and Sexual Activity  . Alcohol use: Not Currently    Alcohol/week: 2.0 standard drinks    Types: 2 Glasses of wine per week  . Drug use: No  . Sexual activity: Yes    Birth control/protection: None  Other Topics Concern  . Not on file  Social History Narrative  . Not on file   Social Determinants of Health   Financial Resource Strain: Low Risk   . Difficulty of Paying Living Expenses: Not hard at all  Food Insecurity: No Food Insecurity  . Worried About Programme researcher, broadcasting/film/video in the Last Year: Never true  . Ran Out of Food in the Last Year: Never true  Transportation Needs: No Transportation Needs  . Lack of Transportation (Medical): No  . Lack of Transportation (Non-Medical): No  Physical Activity: Inactive  . Days of Exercise per Week: 0 days  . Minutes of Exercise per Session: 0 min  Stress: Stress Concern Present  . Feeling of Stress : Rather much  Social Connections: Unknown  . Frequency of Communication with Friends and Family: Not on file  . Frequency of Social Gatherings with Friends and Family: Not on file  . Attends Religious Services: Never  . Active Member of Clubs or Organizations: No  . Attends Banker Meetings: Never  . Marital Status: Married    Allergies: No Known Allergies  Metabolic Disorder Labs: No results found for: HGBA1C, MPG No results found for: PROLACTIN Lab Results  Component Value Date   CHOL 131 01/28/2019   TRIG 46 01/28/2019   HDL 64 01/28/2019   CHOLHDL 2.0 01/28/2019   LDLCALC 56 01/28/2019   Lab Results  Component Value Date   TSH 1.380 01/28/2019   TSH 3.965 11/19/2014    Therapeutic Level Labs: No results found for: LITHIUM No results found for: VALPROATE No components found for:  CBMZ  Current Medications: Current Outpatient Medications  Medication Sig Dispense Refill  . Biotin 71245 MCG TABS Take 10,000 mcg  by mouth daily.    Marland Kitchen buPROPion (WELLBUTRIN XL) 150 MG 24 hr tablet Take 1 tablet (150 mg total) by mouth daily. 90 tablet 0  . Cholecalciferol (VITAMIN D) 50 MCG (2000 UT) tablet Take 2,000 Units by mouth daily.    . cyanocobalamin 1000 MCG tablet Take 1,000 mcg by mouth daily.     . DULoxetine (CYMBALTA) 30 MG capsule Take 1 capsule (30 mg total) by mouth daily. 90 capsule 0  . ferrous sulfate 325 (65 FE) MG tablet Take 325 mg by mouth daily.     . Flibanserin (ADDYI) 100 MG TABS Take 1 tablet by mouth daily. 30 tablet 11  . gabapentin (NEURONTIN) 300 MG capsule TAKE 1 CAPSULE(300 MG) BY MOUTH THREE TIMES DAILY 270 capsule 0  . hydrOXYzine (ATARAX/VISTARIL) 25 MG tablet Take 0.5-1 tablets (12.5-25 mg total) by mouth 3 (three) times daily as needed for anxiety. For anxiety, sleep 90 tablet 1  . ibuprofen (ADVIL) 200 MG tablet  Take 400-800 mg by mouth every 6 (six) hours as needed for headache or moderate pain.    . Levocarnitine 500 MG TABS Take 500 mg by mouth daily.     . Multiple Vitamin (MULTIVITAMIN WITH MINERALS) TABS tablet Take 1 tablet by mouth at bedtime.    Marland Kitchen omeprazole (PRILOSEC) 20 MG capsule Take 20 mg by mouth every evening.     . progesterone (PROMETRIUM) 100 MG capsule Take 1 capsule (100 mg total) vaginally twice daily; start three days after ovulation.    . tranexamic acid (LYSTEDA) 650 MG TABS tablet Take 2 tablets (1,300 mg total) by mouth 3 (three) times daily. Take during menses for a maximum of five days 30 tablet 6   No current facility-administered medications for this visit.     Musculoskeletal: Strength & Muscle Tone: UTA Gait & Station: normal Patient leans: N/A  Psychiatric Specialty Exam: Review of Systems  Psychiatric/Behavioral: Negative for agitation, behavioral problems, confusion, decreased concentration, dysphoric mood, hallucinations, self-injury, sleep disturbance and suicidal ideas. The patient is not nervous/anxious and is not hyperactive.   All  other systems reviewed and are negative.   There were no vitals taken for this visit.There is no height or weight on file to calculate BMI.  General Appearance: Casual  Eye Contact:  Fair  Speech:  Clear and Coherent  Volume:  Normal  Mood:  Euthymic  Affect:  Congruent  Thought Process:  Goal Directed and Descriptions of Associations: Intact  Orientation:  Full (Time, Place, and Person)  Thought Content: Logical   Suicidal Thoughts:  No  Homicidal Thoughts:  No  Memory:  Immediate;   Fair Recent;   Fair Remote;   Fair  Judgement:  Fair  Insight:  Fair  Psychomotor Activity:  Normal  Concentration:  Concentration: Fair and Attention Span: Fair  Recall:  [employer] of Knowledge: Fair  Language: Fair  Akathisia:  No  Handed:  Right  AIMS (if indicated): UTA  Assets:  Communication Skills Desire for Improvement Housing Leisure Time Social Support Talents/Skills Transportation Vocational/Educational  ADL's:  Intact  Cognition: WNL  Sleep:  Fair   Screenings: GAD-7     Office Visit from 12/07/2017 in [facility] Office Visit from 06/12/2015 in [facility]  Office Visit from 04/02/2015 in [facility]  Total GAD-7 Score 14 11 18     PHQ2-9     Video Visit from 07/23/2019 in [facility] Office Visit from 01/21/2019 in [facility] Office Visit from 02/06/2018 in [facility] Office Visit from 12/07/2017 in [facility] Office Visit from 04/02/2015 in [facility]  PHQ-2 Total Score 0 1 2 3  0  PHQ-9 Total Score -- 5 16 15  --       Assessment and Plan: Mary Fox is a 33 year old Caucasian female, married, employed, lives in [place], has a history of depression, anxiety, iron deficiency anemia was evaluated by telemedicine today.  Patient is biologically predisposed given her history of mental health problems in her family.  Patient  with psychosocial stressors of the pandemic, work-related stressors is currently stable on medications.  Plan as noted below.  Plan GAD-stable Cymbalta 30 mg p.o. daily Gabapentin 300 mg p.o. 3 times daily Wellbutrin XL 150 mg p.o. daily Hydroxyzine 12.5 to 25 mg p.o. 3 times daily as needed Patient to continue psychotherapy sessions.  MDD in full remission Cymbalta 30 mg p.o. daily Wellbutrin XL 150  mg p.o. daily Continue CBT  Patient uses hydroxyzine for sleep, discussed sleep hygiene techniques, relaxation techniques, mindfulness, body scan meditations prior to bedtime.  Discussed with patient not to use hydroxyzine every night.  Discussed that she can be started on another sleep medication if she needs more help. Also discussed with patient since her depression and anxiety symptoms are currently stable she can be tapered off of some of her medications in the next 3 to 6 months if she continues to do well.  Follow-up in clinic in 2 to 3 months or sooner if needed.  I have spent atleast 20 minutes non face to face with patient today. More than 50 % of the time was spent for  ordering medications and test ,psychoeducation and supportive psychotherapy and care coordination,as well as documenting clinical information in electronic health record. This note was generated in part or whole with voice recognition software. Voice recognition is usually quite accurate but there are transcription errors that can and very often do occur. I apologize for any typographical errors that were not detected and corrected.        Jomarie Longs, MD 11/11/2019, 2:53 PM

## 2019-11-29 ENCOUNTER — Other Ambulatory Visit: Payer: Self-pay | Admitting: Psychiatry

## 2019-11-29 DIAGNOSIS — F411 Generalized anxiety disorder: Secondary | ICD-10-CM

## 2019-11-29 DIAGNOSIS — F330 Major depressive disorder, recurrent, mild: Secondary | ICD-10-CM

## 2019-12-11 ENCOUNTER — Other Ambulatory Visit: Payer: Self-pay

## 2019-12-11 ENCOUNTER — Inpatient Hospital Stay: Payer: Managed Care, Other (non HMO)

## 2019-12-11 ENCOUNTER — Encounter: Payer: Self-pay | Admitting: Oncology

## 2019-12-11 ENCOUNTER — Inpatient Hospital Stay: Payer: Managed Care, Other (non HMO) | Attending: Oncology | Admitting: Oncology

## 2019-12-11 VITALS — BP 103/70 | HR 71 | Temp 97.2°F | Resp 18 | Wt 196.1 lb

## 2019-12-11 DIAGNOSIS — Z79899 Other long term (current) drug therapy: Secondary | ICD-10-CM | POA: Diagnosis not present

## 2019-12-11 DIAGNOSIS — D500 Iron deficiency anemia secondary to blood loss (chronic): Secondary | ICD-10-CM | POA: Diagnosis present

## 2019-12-11 DIAGNOSIS — Z9884 Bariatric surgery status: Secondary | ICD-10-CM | POA: Diagnosis not present

## 2019-12-11 DIAGNOSIS — R42 Dizziness and giddiness: Secondary | ICD-10-CM | POA: Insufficient documentation

## 2019-12-11 DIAGNOSIS — F418 Other specified anxiety disorders: Secondary | ICD-10-CM | POA: Insufficient documentation

## 2019-12-11 DIAGNOSIS — N920 Excessive and frequent menstruation with regular cycle: Secondary | ICD-10-CM | POA: Diagnosis not present

## 2019-12-11 DIAGNOSIS — R0609 Other forms of dyspnea: Secondary | ICD-10-CM | POA: Diagnosis not present

## 2019-12-11 DIAGNOSIS — R5383 Other fatigue: Secondary | ICD-10-CM | POA: Diagnosis not present

## 2019-12-11 LAB — CBC WITH DIFFERENTIAL/PLATELET
Abs Immature Granulocytes: 0.01 10*3/uL (ref 0.00–0.07)
Basophils Absolute: 0 10*3/uL (ref 0.0–0.1)
Basophils Relative: 1 %
Eosinophils Absolute: 0.2 10*3/uL (ref 0.0–0.5)
Eosinophils Relative: 2 %
HCT: 37.7 % (ref 36.0–46.0)
Hemoglobin: 12.8 g/dL (ref 12.0–15.0)
Immature Granulocytes: 0 %
Lymphocytes Relative: 27 %
Lymphs Abs: 1.7 10*3/uL (ref 0.7–4.0)
MCH: 28.9 pg (ref 26.0–34.0)
MCHC: 34 g/dL (ref 30.0–36.0)
MCV: 85.1 fL (ref 80.0–100.0)
Monocytes Absolute: 0.5 10*3/uL (ref 0.1–1.0)
Monocytes Relative: 8 %
Neutro Abs: 3.8 10*3/uL (ref 1.7–7.7)
Neutrophils Relative %: 62 %
Platelets: 217 10*3/uL (ref 150–400)
RBC: 4.43 MIL/uL (ref 3.87–5.11)
RDW: 12.9 % (ref 11.5–15.5)
WBC: 6.2 10*3/uL (ref 4.0–10.5)
nRBC: 0 % (ref 0.0–0.2)

## 2019-12-11 LAB — COMPREHENSIVE METABOLIC PANEL
ALT: 25 U/L (ref 0–44)
AST: 18 U/L (ref 15–41)
Albumin: 3.9 g/dL (ref 3.5–5.0)
Alkaline Phosphatase: 80 U/L (ref 38–126)
Anion gap: 6 (ref 5–15)
BUN: 9 mg/dL (ref 6–20)
CO2: 22 mmol/L (ref 22–32)
Calcium: 8.2 mg/dL — ABNORMAL LOW (ref 8.9–10.3)
Chloride: 109 mmol/L (ref 98–111)
Creatinine, Ser: 0.69 mg/dL (ref 0.44–1.00)
GFR calc Af Amer: >60 mL/min (ref >60)
GFR calc non Af Amer: >60 mL/min (ref >60)
Glucose, Bld: 86 mg/dL (ref 70–99)
Potassium: 4.6 mmol/L (ref 3.5–5.1)
Sodium: 137 mmol/L (ref 135–145)
Total Bilirubin: 0.3 mg/dL (ref 0.3–1.2)
Total Protein: 6.5 g/dL (ref 6.5–8.1)

## 2019-12-11 LAB — IRON AND TIBC
Iron: 88 ug/dL (ref 28–170)
Saturation Ratios: 25 % (ref 10.4–31.8)
TIBC: 357 ug/dL (ref 250–450)
UIBC: 269 ug/dL

## 2019-12-11 LAB — FERRITIN: Ferritin: 20 ng/mL (ref 11–307)

## 2019-12-11 LAB — SAMPLE TO BLOOD BANK

## 2019-12-11 NOTE — Progress Notes (Signed)
Hematology/Oncology follow up note [facility] Telephone:([phone]) [phone] [phone]) [phone]   Patient Care Team: Glori Luis, MD as PCP - General (Family Medicine)  REFERRING PROVIDER: Glori Luis, MD CHIEF COMPLAINTS/REASON FOR VISIT:  Evaluation of iron deficiency anemia  HISTORY OF PRESENTING ILLNESS:  Mary Fox is a  34 y.o.  female with PMH listed below was seen in consultation at the request of Glori Luis, MD  for evaluation of iron deficiency anemia.   Reviewed patient's recent labs  01/28/2019 labs revealed anemia with hemoglobin of 6.4, MCV 59, normal WBC, platelet count and differential. Iron panel showed ferritin of 3, iron saturation 3. Reviewed patient's previous labs ordered by primary care physician's office, anemia is chronic onset , duration is since at least 2018. Patient was offered blood transfusion by GYN.  She declined. Associated signs and symptoms: Patient reports fatigue.  She also reports mild shortness of breath with exertion.  Occasionally she feels lightheaded. Denies weight loss, easy bruising, hematochezia, hemoptysis, hematuria. Context:  History of iron deficiency: Yes recently diagnosed.  Started on oral slow FE tablets.  Also takes vitamin C tablets. Rectal bleeding: Occasionally. Menstrual bleeding/ Vaginal bleeding : Heavy menstrual bleeding. Hematemesis or hemoptysis : denies Blood in urine : denies  Last endoscopy: Fatigue: Yes.  History of gastric bypass surgery  INTERVAL HISTORY Mary Fox is a 34 y.o. female who has above history reviewed by me today presents for follow up visit for management of iron deficiency anemia Problems and complaints are listed below: Patient was seen by me in November 2020.  She has had IV Venofer treatments. She lost follow-up since then.  Recently she feels fatigued, and she caught and want to reestablish care.  She also has been going through a lot of stress in  life. Menorrhagia, symptoms are stable.  Not better or worse  Review of Systems  Constitutional: Negative for appetite change, chills, fatigue and fever.  HENT:   Negative for hearing loss and voice change.   Eyes: Negative for eye problems.  Respiratory: Negative for chest tightness and cough.   Cardiovascular: Negative for chest pain.  Gastrointestinal: Negative for abdominal distention, abdominal pain and blood in stool.  Endocrine: Negative for hot flashes.  Genitourinary: Negative for difficulty urinating and frequency.   Musculoskeletal: Negative for arthralgias.  Skin: Negative for itching and rash.  Neurological: Negative for extremity weakness.  Hematological: Negative for adenopathy.  Psychiatric/Behavioral: Negative for confusion.    MEDICAL HISTORY:  Past Medical History:  Diagnosis Date  . Allergic reaction 05/31/2017  . Anxiety   . Depression   . IDA (iron deficiency anemia) 02/01/2019  . IDA (iron deficiency anemia) 02/01/2019    SURGICAL HISTORY: Past Surgical History:  Procedure Laterality Date  . CESAREAN SECTION  09/05/2016  . CHOLECYSTECTOMY  07 31 2012  . GASTRIC BYPASS      SOCIAL HISTORY: Social History   Socioeconomic History  . Marital status: Married    Spouse name: benjamin  . Number of children: 2  . Years of education: Not on file  . Highest education level: Master's degree (e.g., MA, MS, MEng, MEd, MSW, MBA)  Occupational History  . Not on file  Tobacco Use  . Smoking status: Former Smoker    Quit date: 11/05/2010    Years since quitting: 9.1  . Smokeless tobacco: Never Used  Vaping Use  . Vaping Use: Never used  Substance and Sexual Activity  . Alcohol use: Not Currently  Alcohol/week: 2.0 standard drinks    Types: 2 Glasses of wine per week  . Drug use: No  . Sexual activity: Yes    Birth control/protection: None  Other Topics Concern  . Not on file  Social History Narrative  . Not on file   Social Determinants of  Health   Financial Resource Strain: Low Risk   . Difficulty of Paying Living Expenses: Not hard at all  Food Insecurity: No Food Insecurity  . Worried About Programme researcher, broadcasting/film/video in the Last Year: Never true  . Ran Out of Food in the Last Year: Never true  Transportation Needs: No Transportation Needs  . Lack of Transportation (Medical): No  . Lack of Transportation (Non-Medical): No  Physical Activity: Inactive  . Days of Exercise per Week: 0 days  . Minutes of Exercise per Session: 0 min  Stress: Stress Concern Present  . Feeling of Stress : Rather much  Social Connections: Unknown  . Frequency of Communication with Friends and Family: Not on file  . Frequency of Social Gatherings with Friends and Family: Not on file  . Attends Religious Services: Never  . Active Member of Clubs or Organizations: No  . Attends Banker Meetings: Never  . Marital Status: Married  Catering manager Violence: Not At Risk  . Fear of Current or Ex-Partner: No  . Emotionally Abused: No  . Physically Abused: No  . Sexually Abused: No    FAMILY HISTORY: Family History  Problem Relation Age of Onset  . Alcohol abuse Mother   . Anxiety disorder Mother   . Depression Mother   . Alcohol abuse Sister   . Anxiety disorder Sister   . Depression Sister   . Diabetes Neg Hx     ALLERGIES:  has No Known Allergies.  MEDICATIONS:  Current Outpatient Medications  Medication Sig Dispense Refill  . Biotin 36144 MCG TABS Take 10,000 mcg by mouth daily.    Marland Kitchen buPROPion (WELLBUTRIN XL) 150 MG 24 hr tablet Take 1 tablet (150 mg total) by mouth daily. 90 tablet 0  . Cholecalciferol (VITAMIN D) 50 MCG (2000 UT) tablet Take 2,000 Units by mouth daily.    . cyanocobalamin 1000 MCG tablet Take 1,000 mcg by mouth daily.     . DULoxetine (CYMBALTA) 30 MG capsule Take 1 capsule (30 mg total) by mouth daily. 90 capsule 0  . ferrous sulfate 325 (65 FE) MG tablet Take 325 mg by mouth 2 (two) times daily with a  meal.     . gabapentin (NEURONTIN) 300 MG capsule TAKE 1 CAPSULE(300 MG) BY MOUTH THREE TIMES DAILY 270 capsule 0  . hydrOXYzine (ATARAX/VISTARIL) 25 MG tablet Take 0.5-1 tablets (12.5-25 mg total) by mouth 3 (three) times daily as needed for anxiety. For anxiety, sleep 90 tablet 1  . ibuprofen (ADVIL) 200 MG tablet Take 400-800 mg by mouth every 6 (six) hours as needed for headache or moderate pain.    . Multiple Vitamin (MULTIVITAMIN WITH MINERALS) TABS tablet Take 1 tablet by mouth at bedtime.    Marland Kitchen omeprazole (PRILOSEC) 20 MG capsule Take 20 mg by mouth every evening.     . Flibanserin (ADDYI) 100 MG TABS Take 1 tablet by mouth daily. (Patient not taking: Reported on 12/11/2019) 30 tablet 11  . Levocarnitine 500 MG TABS Take 500 mg by mouth daily.  (Patient not taking: Reported on 12/11/2019)    . progesterone (PROMETRIUM) 100 MG capsule Take 1 capsule (100 mg total) vaginally twice  daily; start three days after ovulation. (Patient not taking: Reported on 12/11/2019)    . tranexamic acid (LYSTEDA) 650 MG TABS tablet Take 2 tablets (1,300 mg total) by mouth 3 (three) times daily. Take during menses for a maximum of five days (Patient not taking: Reported on 12/11/2019) 30 tablet 6   No current facility-administered medications for this visit.     PHYSICAL EXAMINATION: ECOG PERFORMANCE STATUS: 0 - Asymptomatic Vitals:   12/11/19 1435  BP: 103/70  Pulse: 71  Resp: 18  Temp: (!) 97.2 F (36.2 C)   Filed Weights   12/11/19 1435  Weight: 196 lb 1.6 oz (89 kg)    Physical Exam Constitutional:      General: She is not in acute distress. HENT:     Head: Normocephalic and atraumatic.  Eyes:     General: No scleral icterus.    Pupils: Pupils are equal, round, and reactive to light.  Cardiovascular:     Rate and Rhythm: Normal rate and regular rhythm.     Heart sounds: Normal heart sounds.  Pulmonary:     Effort: Pulmonary effort is normal. No respiratory distress.     Breath sounds: No  wheezing.  Abdominal:     General: Bowel sounds are normal. There is no distension.     Palpations: Abdomen is soft. There is no mass.     Tenderness: There is no abdominal tenderness.  Musculoskeletal:        General: No deformity. Normal range of motion.     Cervical back: Normal range of motion and neck supple.  Skin:    General: Skin is warm and dry.     Coloration: Skin is not pale.     Findings: No erythema or rash.  Neurological:     Mental Status: She is alert and oriented to person, place, and time.     Cranial Nerves: No cranial nerve deficit.     Coordination: Coordination normal.  Psychiatric:        Behavior: Behavior normal.        Thought Content: Thought content normal.       CMP Latest Ref Rng & Units 12/11/2019  Glucose 70 - 99 mg/dL 86  BUN 6 - 20 mg/dL 9  Creatinine 2.950.44 - 2.841.00 mg/dL 1.320.69  Sodium 440135 - 102145 mmol/L 137  Potassium 3.5 - 5.1 mmol/L 4.6  Chloride 98 - 111 mmol/L 109  CO2 22 - 32 mmol/L 22  Calcium 8.9 - 10.3 mg/dL 8.2(L)  Total Protein 6.5 - 8.1 g/dL 6.5  Total Bilirubin 0.3 - 1.2 mg/dL 0.3  Alkaline Phos 38 - 126 U/L 80  AST 15 - 41 U/L 18  ALT 0 - 44 U/L 25   CBC Latest Ref Rng & Units 12/11/2019  WBC 4.0 - 10.5 K/uL 6.2  Hemoglobin 12.0 - 15.0 g/dL 72.512.8  Hematocrit 36 - 46 % 37.7  Platelets 150 - 400 K/uL 217     LABORATORY DATA:  I have reviewed the data as listed Lab Results  Component Value Date   WBC 6.2 12/11/2019   HGB 12.8 12/11/2019   HCT 37.7 12/11/2019   MCV 85.1 12/11/2019   PLT 217 12/11/2019   Recent Labs    01/28/19 0815 12/11/19 1412  NA 141 137  K 4.4 4.6  CL 109* 109  CO2 21 22  GLUCOSE 76 86  BUN 8 9  CREATININE 0.65 0.69  CALCIUM 8.8 8.2*  GFRNONAA 115 >60  GFRAA 133 >60  PROT 6.5 6.5  ALBUMIN 4.1 3.9  AST 22 18  ALT 13 25  ALKPHOS 73 80  BILITOT 0.2 0.3   Iron/TIBC/Ferritin/ %Sat    Component Value Date/Time   IRON 14 (L) 01/28/2019 0815   TIBC 474 (H) 01/28/2019 0815   FERRITIN 3  (L) 01/28/2019 0815   IRONPCTSAT 3 (LL) 01/28/2019 0815    No results found.    ASSESSMENT & PLAN:  1. Iron deficiency anemia due to chronic blood loss   2. Menorrhagia with regular cycle   3. History of gastric bypass    History of iron deficiency anemia Patient has had blood work done today.  Labs are reviewed.  Iron labs are available after she left the clinic. No anemia, hemoglobin stable at 12.8. Iron lab shows normal ferritin, iron saturation. No need for additional IV Venofer at this point.  Patient has chronic menorrhagia I recommend patient to continue follow-up with gynecology. History of gastric bypass, she is at risk of developing iron deficiency in the future. I recommend patient to follow-up every 6 to 12 months for repeating blood work and possible IV Venofer treatments.   All questions were answered. The patient knows to call the clinic with any problems questions or concerns.  Return of visit:  6 months for follow-up and evaluation of additional IV iron.   Rickard Patience, MD, PhD Hematology Oncology [facility] Pager- [phone] 12/11/2019

## 2019-12-11 NOTE — Progress Notes (Signed)
Patient started having symptoms of her anemia for the past month.

## 2019-12-12 ENCOUNTER — Telehealth: Payer: Self-pay

## 2019-12-12 NOTE — Telephone Encounter (Signed)
Done.. Appts has been sched as requested. A detailed message was left on pts VM making her aware of her sched appt date And if dates and time wasn't good for her to contact the office to have appt R/S. NEW appt reminder letter will be mailed out.

## 2019-12-12 NOTE — Telephone Encounter (Signed)
Left message notifying patient.  Please schedule appts and let her know appt details.

## 2019-12-12 NOTE — Telephone Encounter (Signed)
-----   Message from Rickard Patience, MD sent at 12/11/2019  5:02 PM EDT ----- Please let patient know that her iron results are good.  No iron deficiency currently. With her history of heavy menstrual flow, history of gastric bypass, she is at risk of developing vitamin deficiency.  Recommend patient to follow-up every 6 months and if she agrees please schedule-labs in 6 months, 1 to 2 days prior to virtual MD. If she prefers to call when she needs an appointment, it is update her.

## 2019-12-24 ENCOUNTER — Encounter: Payer: Self-pay | Admitting: Obstetrics and Gynecology

## 2019-12-24 ENCOUNTER — Ambulatory Visit: Payer: Managed Care, Other (non HMO) | Admitting: Obstetrics and Gynecology

## 2019-12-24 ENCOUNTER — Other Ambulatory Visit: Payer: Self-pay

## 2019-12-24 VITALS — BP 110/80 | Ht 67.5 in | Wt 197.0 lb

## 2019-12-24 DIAGNOSIS — F3281 Premenstrual dysphoric disorder: Secondary | ICD-10-CM | POA: Insufficient documentation

## 2019-12-24 DIAGNOSIS — Z30011 Encounter for initial prescription of contraceptive pills: Secondary | ICD-10-CM

## 2019-12-24 MED ORDER — DROSPIRENONE-ETHINYL ESTRADIOL 3-0.02 MG PO TABS: 1.0000 | ORAL_TABLET | Freq: Every day | ORAL | 0 refills | Status: DC

## 2019-12-24 NOTE — Progress Notes (Signed)
Mary Luis, MD   Chief Complaint  Patient presents with  . Contraception    not sure of BC method  . [employer] Employee    HPI:      Mary Fox is a 34 y.o. [id number] whose LMP was Patient's last menstrual period was 12/16/2019 (exact date)., presents today for [facility] consult. Menses are monthly, lasting 4 days, heavy flow with large clots, no BTB, mild dysmen, improved with midol. Hx of anxiety with worsening sx/emotional lability before her menses. Is interested in [facility] to help with mood sx. Is on several meds for anxiety without sx relief. No hx of HTN, DVTs, migraines with aura.  Hx of infertility. Hx of thickened EM, was going to do hysteroscopy/D&C with Dr. Jerene Pitch 1/21 but never done. Had anemia requiring iron transfusions; normal CBC and ferritin 8/21  Last pap 10/20 was normal, neg HPV DNA  Past Medical History:  Diagnosis Date  . Allergic reaction 05/31/2017  . Anxiety   . Depression   . IDA (iron deficiency anemia) 02/01/2019  . IDA (iron deficiency anemia) 02/01/2019    Past Surgical History:  Procedure Laterality Date  . CESAREAN SECTION  09/05/2016  . CHOLECYSTECTOMY  07 31 2012  . GASTRIC BYPASS      Family History  Problem Relation Age of Onset  . Alcohol abuse Mother   . Anxiety disorder Mother   . Depression Mother   . Alcohol abuse Sister   . Anxiety disorder Sister   . Depression Sister   . Diabetes Neg Hx     Social History   Socioeconomic History  . Marital status: Married    Spouse name: benjamin  . Number of children: 2  . Years of education: Not on file  . Highest education level: Master's degree (e.g., MA, MS, MEng, MEd, MSW, MBA)  Occupational History  . Not on file  Tobacco Use  . Smoking status: Former Smoker    Quit date: 11/05/2010    Years since quitting: 9.1  . Smokeless tobacco: Never Used  Vaping Use  . Vaping Use: Never used  Substance and Sexual Activity  . Alcohol use: Not Currently    Alcohol/week: 2.0  standard drinks    Types: 2 Glasses of wine per week  . Drug use: No  . Sexual activity: Yes    Birth control/protection: None  Other Topics Concern  . Not on file  Social History Narrative  . Not on file   Social Determinants of Health   Financial Resource Strain: Low Risk   . Difficulty of Paying Living Expenses: Not hard at all  Food Insecurity: No Food Insecurity  . Worried About Programme researcher, broadcasting/film/video in the Last Year: Never true  . Ran Out of Food in the Last Year: Never true  Transportation Needs: No Transportation Needs  . Lack of Transportation (Medical): No  . Lack of Transportation (Non-Medical): No  Physical Activity: Inactive  . Days of Exercise per Week: 0 days  . Minutes of Exercise per Session: 0 min  Stress: Stress Concern Present  . Feeling of Stress : Rather much  Social Connections: Unknown  . Frequency of Communication with Friends and Family: Not on file  . Frequency of Social Gatherings with Friends and Family: Not on file  . Attends Religious Services: Never  . Active Member of Clubs or Organizations: No  . Attends Banker Meetings: Never  . Marital Status: Married  Catering manager Violence: Not At  Risk  . Fear of Current or Ex-Partner: No  . Emotionally Abused: No  . Physically Abused: No  . Sexually Abused: No    Outpatient Medications Prior to Visit  Medication Sig Dispense Refill  . Biotin 17494 MCG TABS Take 10,000 mcg by mouth daily.    Marland Kitchen buPROPion (WELLBUTRIN XL) 150 MG 24 hr tablet Take 1 tablet (150 mg total) by mouth daily. 90 tablet 0  . Cholecalciferol (VITAMIN D) 50 MCG (2000 UT) tablet Take 2,000 Units by mouth daily.    . DULoxetine (CYMBALTA) 30 MG capsule Take 1 capsule (30 mg total) by mouth daily. 90 capsule 0  . ferrous sulfate 325 (65 FE) MG tablet Take 325 mg by mouth 2 (two) times daily with a meal.     . gabapentin (NEURONTIN) 300 MG capsule TAKE 1 CAPSULE(300 MG) BY MOUTH THREE TIMES DAILY 270 capsule 0  .  hydrOXYzine (ATARAX/VISTARIL) 25 MG tablet Take 0.5-1 tablets (12.5-25 mg total) by mouth 3 (three) times daily as needed for anxiety. For anxiety, sleep 90 tablet 1  . ibuprofen (ADVIL) 200 MG tablet Take 400-800 mg by mouth every 6 (six) hours as needed for headache or moderate pain.    . Multiple Vitamin (MULTIVITAMIN WITH MINERALS) TABS tablet Take 1 tablet by mouth at bedtime.    Marland Kitchen omeprazole (PRILOSEC) 20 MG capsule Take 20 mg by mouth every evening.     . cyanocobalamin 1000 MCG tablet Take 1,000 mcg by mouth daily.  (Patient not taking: Reported on 12/24/2019)    . Flibanserin (ADDYI) 100 MG TABS Take 1 tablet by mouth daily. (Patient not taking: Reported on 12/11/2019) 30 tablet 11  . Levocarnitine 500 MG TABS Take 500 mg by mouth daily.  (Patient not taking: Reported on 12/11/2019)    . progesterone (PROMETRIUM) 100 MG capsule Take 1 capsule (100 mg total) vaginally twice daily; start three days after ovulation. (Patient not taking: Reported on 12/11/2019)    . tranexamic acid (LYSTEDA) 650 MG TABS tablet Take 2 tablets (1,300 mg total) by mouth 3 (three) times daily. Take during menses for a maximum of five days (Patient not taking: Reported on 12/11/2019) 30 tablet 6   No facility-administered medications prior to visit.      ROS:  Review of Systems  Constitutional: Negative for fever.  Gastrointestinal: Negative for blood in stool, constipation, diarrhea, nausea and vomiting.  Genitourinary: Negative for dyspareunia, dysuria, flank pain, frequency, hematuria, urgency, vaginal bleeding, vaginal discharge and vaginal pain.  Musculoskeletal: Negative for back pain.  Skin: Negative for rash.  Psychiatric/Behavioral: Positive for agitation.   BREAST: No symptoms   OBJECTIVE:   Vitals:  BP 110/80   Ht 5' 7.5" (1.715 m)   Wt 197 lb (89.4 kg)   LMP 12/16/2019 (Exact Date)   BMI 30.40 kg/m   Physical Exam Vitals reviewed.  Constitutional:      Appearance: She is well-developed.   Pulmonary:     Effort: Pulmonary effort is normal.  Musculoskeletal:        General: Normal range of motion.     Cervical back: Normal range of motion.  Skin:    General: Skin is warm and dry.  Neurological:     General: No focal deficit present.     Mental Status: She is alert and oriented to person, place, and time.     Cranial Nerves: No cranial nerve deficit.  Psychiatric:        Mood and Affect: Mood normal.  Behavior: Behavior normal.        Thought Content: Thought content normal.        Judgment: Judgment normal.     Assessment/Plan: Encounter for initial prescription of contraceptive pills - Plan: drospirenone-ethinyl estradiol (YAZ) 3-0.02 MG tablet  PMDD (premenstrual dysphoric disorder)  Pt with worsened anxiety/emotional sx before menses. Try Yaz. Rx eRxd. RTO for annual and f/u in a couple months. F/u sooner prn.   Meds ordered this encounter  Medications  . drospirenone-ethinyl estradiol (YAZ) 3-0.02 MG tablet    Sig: Take 1 tablet by mouth daily.    Dispense:  84 tablet    Refill:  0    Order Specific Question:   Supervising Provider    Answer:   Nadara Mustard [[id number]]      Return in about 2 months (around 02/23/2020) for annual.  Donneisha Beane B. Hisao Doo, PA-C 12/24/2019 9:55 AM

## 2019-12-24 NOTE — Patient Instructions (Signed)
I value your feedback and entrusting us with your care. If you get a [facility] patient survey, I would appreciate you taking the time to let us know about your experience today. Thank you!  As of March 28, 2019, your lab results will be released to your MyChart immediately, before I even have a chance to see them. Please give me time to review them and contact you if there are any abnormalities. Thank you for your patience.  

## 2020-01-23 ENCOUNTER — Other Ambulatory Visit: Payer: Self-pay

## 2020-01-23 ENCOUNTER — Encounter: Payer: Self-pay | Admitting: Psychiatry

## 2020-01-23 ENCOUNTER — Telehealth (INDEPENDENT_AMBULATORY_CARE_PROVIDER_SITE_OTHER): Payer: 59 | Admitting: Psychiatry

## 2020-01-23 ENCOUNTER — Other Ambulatory Visit: Payer: Self-pay | Admitting: Family Medicine

## 2020-01-23 DIAGNOSIS — F3342 Major depressive disorder, recurrent, in full remission: Secondary | ICD-10-CM | POA: Diagnosis not present

## 2020-01-23 DIAGNOSIS — F39 Unspecified mood [affective] disorder: Secondary | ICD-10-CM | POA: Insufficient documentation

## 2020-01-23 DIAGNOSIS — F411 Generalized anxiety disorder: Secondary | ICD-10-CM

## 2020-01-23 MED ORDER — DESVENLAFAXINE SUCCINATE ER 50 MG PO TB24: 50.0000 mg | ORAL_TABLET | Freq: Every day | ORAL | 1 refills | Status: DC

## 2020-01-23 MED ORDER — CLONAZEPAM 0.5 MG PO TABS: 0.5000 mg | ORAL_TABLET | Freq: Every day | ORAL | 0 refills | Status: DC | PRN

## 2020-01-23 NOTE — Progress Notes (Signed)
Provider Location : [facility] Patient Location : Car  Participants: Patient , Provider  Virtual Visit via Video Note  I connected with Mary Fox on 01/23/20 at  2:00 PM EDT by a video enabled telemedicine application and verified that I am speaking with the correct person using two identifiers.   I discussed the limitations of evaluation and management by telemedicine and the availability of in person appointments. The patient expressed understanding and agreed to proceed.    I discussed the assessment and treatment plan with the patient. The patient was provided an opportunity to ask questions and all were answered. The patient agreed with the plan and demonstrated an understanding of the instructions.   The patient was advised to call back or seek an in-person evaluation if the symptoms worsen or if the condition fails to improve as anticipated.  BH MD OP Progress Note  01/23/2020 5:01 PM Aasiya Creasey  MRN:  [id number]  Chief Complaint:  Chief Complaint    Follow-up     HPI: Mary Fox is a 34 year old Caucasian femalemale, married, employed, lives in [place], has a history of GAD, MDD, iron deficiency anemia, gastroesophageal reflux disease was evaluated by telemedicine today.  Patient today reports that she has been struggling with mood symptoms recently.  She reports she goes through these cycles when she feels irritable, snappy, cannot focus, and has a lot of anxiety and racing thoughts.  She reports it usually gets worse around the time of her menstrual period.  She reports she also has the symptoms otherwise to some extent however it does not last too long.  She denies any hypomanic or manic symptoms like high energy, spending sprees, decreased need for sleep, and so on.  She reports in spite of being on all these medications she does not feel they are making any huge difference.  This is however different from what she had reported last visit when she did notice changes with the  Wellbutrin and reported that had made a huge difference.  Patient today wonders whether she should just go back on the clonazepam since in spite of being on all these medications she is still struggling.  Patient reports she has been noncompliant with her psychotherapy sessions.  She however reports she is willing to get back in therapy.  Patient reports sleep is restless when she does not take the medication.  However the hydroxyzine does help.  She reports since she was having these mood symptoms towards the time of her menstrual period, she did go to her OB/GYN and is currently on a birth control.  She has been on it since the past 1 month and has not noticed any big difference with her mood since being on it.  However it has helped her with her menstrual cycle.  She denies any suicidality, homicidality or perceptual disturbances.  She denies any other concerns today. Visit Diagnosis:    ICD-10-CM   1. GAD (generalized anxiety disorder)  F41.1 desvenlafaxine (PRISTIQ) 50 MG 24 hr tablet    clonazePAM (KLONOPIN) 0.5 MG tablet  2. Episodic mood disorder (HCC)  F39   3. MDD (major depressive disorder), recurrent, in full remission (HCC)  F33.42     Past Psychiatric History: I have reviewed past psychiatric history from my progress note on 03/07/2019.  Past trials of BuSpar, Effexor, Seroquel-noncompliant, Lexapro, Paxil, Wellbutrin, Zoloft, Klonopin  Past Medical History:  Past Medical History:  Diagnosis Date   Allergic reaction 05/31/2017   Anxiety    Depression  IDA (iron deficiency anemia) 02/01/2019   IDA (iron deficiency anemia) 02/01/2019    Past Surgical History:  Procedure Laterality Date   CESAREAN SECTION  09/05/2016   CHOLECYSTECTOMY  07 31 2012   GASTRIC BYPASS      Family Psychiatric History: I have reviewed family psychiatric history from my progress note on 03/07/2019  Family History:  Family History  Problem Relation Age of Onset   Alcohol abuse  Mother    Anxiety disorder Mother    Depression Mother    Alcohol abuse Sister    Anxiety disorder Sister    Depression Sister    Diabetes Neg Hx     Social History: I have reviewed social history from my progress note on 03/07/2019 Social History   Socioeconomic History   Marital status: Married    Spouse name: benjamin   Number of children: 2   Years of education: Not on file   Highest education level: Master's degree (e.g., MA, MS, MEng, MEd, MSW, MBA)  Occupational History   Not on file  Tobacco Use   Smoking status: Former Smoker    Quit date: 11/05/2010    Years since quitting: 9.2   Smokeless tobacco: Never Used  Vaping Use   Vaping Use: Never used  Substance and Sexual Activity   Alcohol use: Not Currently    Alcohol/week: 2.0 standard drinks    Types: 2 Glasses of wine per week   Drug use: No   Sexual activity: Yes    Birth control/protection: None  Other Topics Concern   Not on file  Social History Narrative   Not on file   Social Determinants of Health   Financial Resource Strain: Low Risk    Difficulty of Paying Living Expenses: Not hard at all  Food Insecurity: No Food Insecurity   Worried About Programme researcher, broadcasting/film/videounning Out of Food in the Last Year: Never true   Ran Out of Food in the Last Year: Never true  Transportation Needs: No Transportation Needs   Lack of Transportation (Medical): No   Lack of Transportation (Non-Medical): No  Physical Activity: Inactive   Days of Exercise per Week: 0 days   Minutes of Exercise per Session: 0 min  Stress: Stress Concern Present   Feeling of Stress : Rather much  Social Connections: Unknown   Frequency of Communication with Friends and Family: Not on file   Frequency of Social Gatherings with Friends and Family: Not on file   Attends Religious Services: Never   Active Member of Clubs or Organizations: No   Attends BankerClub or Organization Meetings: Never   Marital Status: Married     Allergies: No Known Allergies  Metabolic Disorder Labs: No results found for: HGBA1C, MPG No results found for: PROLACTIN Lab Results  Component Value Date   CHOL 131 01/28/2019   TRIG 46 01/28/2019   HDL 64 01/28/2019   CHOLHDL 2.0 01/28/2019   LDLCALC 56 01/28/2019   Lab Results  Component Value Date   TSH 1.380 01/28/2019   TSH 3.965 11/19/2014    Therapeutic Level Labs: No results found for: LITHIUM No results found for: VALPROATE No components found for:  CBMZ  Current Medications: Current Outpatient Medications  Medication Sig Dispense Refill   ADDYI 100 MG TABS Take 1 tablet by mouth daily.     Biotin 7829510000 MCG TABS Take 10,000 mcg by mouth daily.     buPROPion (WELLBUTRIN XL) 150 MG 24 hr tablet Take 1 tablet (150 mg total) by mouth  daily. 90 tablet 0   Cholecalciferol (VITAMIN D) 50 MCG (2000 UT) tablet Take 2,000 Units by mouth daily.     clonazePAM (KLONOPIN) 0.5 MG tablet Take 1 tablet (0.5 mg total) by mouth daily as needed for anxiety. For severe anxiety attacks 10 tablet 0   cyanocobalamin 1000 MCG tablet Take 1,000 mcg by mouth daily.  (Patient not taking: Reported on 12/24/2019)     desvenlafaxine (PRISTIQ) 50 MG 24 hr tablet Take 1 tablet (50 mg total) by mouth at bedtime. 30 tablet 1   drospirenone-ethinyl estradiol (YAZ) 3-0.02 MG tablet Take 1 tablet by mouth daily. 84 tablet 0   ferrous sulfate 325 (65 FE) MG tablet Take 325 mg by mouth 2 (two) times daily with a meal.      gabapentin (NEURONTIN) 300 MG capsule TAKE 1 CAPSULE(300 MG) BY MOUTH THREE TIMES DAILY 270 capsule 0   hydrOXYzine (ATARAX/VISTARIL) 25 MG tablet Take 0.5-1 tablets (12.5-25 mg total) by mouth 3 (three) times daily as needed for anxiety. For anxiety, sleep 90 tablet 1   ibuprofen (ADVIL) 200 MG tablet Take 400-800 mg by mouth every 6 (six) hours as needed for headache or moderate pain.     Multiple Vitamin (MULTIVITAMIN WITH MINERALS) TABS tablet Take 1 tablet by mouth  at bedtime.     nitrofurantoin, macrocrystal-monohydrate, (MACROBID) 100 MG capsule Take 100 mg by mouth 2 (two) times daily.     omeprazole (PRILOSEC) 20 MG capsule Take 20 mg by mouth every evening.      No current facility-administered medications for this visit.     Musculoskeletal: Strength & Muscle Tone: UTA Gait & Station: normal Patient leans: N/A  Psychiatric Specialty Exam: Review of Systems  Psychiatric/Behavioral: Positive for dysphoric mood. The patient is nervous/anxious.   All other systems reviewed and are negative.   There were no vitals taken for this visit.There is no height or weight on file to calculate BMI.  General Appearance: Casual  Eye Contact:  Fair  Speech:  Clear and Coherent  Volume:  Normal  Mood:  Anxious, Dysphoric and Irritable  Affect:  Congruent  Thought Process:  Goal Directed and Descriptions of Associations: Intact  Orientation:  Full (Time, Place, and Person)  Thought Content: Logical   Suicidal Thoughts:  No  Homicidal Thoughts:  No  Memory:  Immediate;   Fair Recent;   Fair Remote;   Fair  Judgement:  Fair  Insight:  Fair  Psychomotor Activity:  Normal  Concentration:  Concentration: Fair and Attention Span: Fair  Recall:  [employer] of Knowledge: Fair  Language: Fair  Akathisia:  No  Handed:  Right  AIMS (if indicated): UTA  Assets:  Communication Skills Desire for Improvement Housing Social Support  ADL's:  Intact  Cognition: WNL  Sleep:  Fair   Screenings: GAD-7     Office Visit from 12/07/2017 in [facility] Office Visit from 06/12/2015 in [facility] Office Visit from 04/02/2015 in [facility]  Total GAD-7 Score 14 11 18     PHQ2-9     Video Visit from 07/23/2019 in [facility] Office Visit from 01/21/2019 in [facility] Office Visit from 02/06/2018 in [facility] Office Visit from 12/07/2017 in [facility] Office Visit from 04/02/2015 in [facility]  PHQ-2 Total Score 0 1 2 3  0  PHQ-9 Total Score -- 5 16 15  --  Assessment and Plan: Ariabella Brien is a 34 year old Caucasian female, married, employed, lives in [place], has a history of depression, GAD, iron deficiency anemia was evaluated by telemedicine today.  Patient is biologically predisposed given her history of mental health problems in her family.  Patient with psychosocial stressors of work-related stressors is currently struggling with episodic mood symptoms.  Her mood disorder questionnaire was completed however she scored low on the same.  Discussed plan as noted below.  Plan GAD-unstable Discontinue Cymbalta. Continue gabapentin 300 mg 3 times daily. Wellbutrin XL 150 mg p.o. daily. Start Pristiq 50 mg p.o. nightly. Restart Klonopin 0.5 mg as needed for anxiety attacks.  Advised patient not to use it regularly and to limit use.  Provided education. I have reviewed [place] controlled substance database.   Episodic mood disorder-unstable Patient completed mood disorder questionnaire and scored low on the same. She reports mood symptoms which are mostly worse around the time of her menstrual period. She however does not meet criteria for bipolar disorder at this time. Gabapentin will help with her mood. Continue hydroxyzine 12.5 to 25 mg p.o. 3 times daily as needed as well as at bedtime for sleep. We will monitor closely.  If she is not making progress with the Pristiq  will consider another mood stabilizer . Will refer for CBT  MDD in full remission We will monitor closely.  Will refer patient for CBT.   Follow-up in clinic in 3 to 4 weeks or sooner if needed.  I have spent atleast 20 minutes face to face by video with patient today. More than 50 % of the time was spent for preparing to see the patient ( e.g., review of test, records ),  ordering medications and test ,psychoeducation  and supportive psychotherapy and care coordination,as well as documenting clinical information in electronic health record. This note was generated in part or whole with voice recognition software. Voice recognition is usually quite accurate but there are transcription errors that can and very often do occur. I apologize for any typographical errors that were not detected and corrected.        Jomarie Longs, MD 01/23/2020, 5:01 PM

## 2020-01-23 NOTE — Patient Instructions (Signed)
Desvenlafaxine extended-release tablets What is this medicine? DESVENLAFAXINE (des VEN la [employer]) is used to treat depression. This medicine may be used for other purposes; ask your health care provider or pharmacist if you have questions. COMMON BRAND NAME(S): Bland Span, Pristiq What should I tell my health care provider before I take this medicine? They need to know if you have any of these conditions:  glaucoma  high blood pressure  kidney disease  liver disease  mania or bipolar disorder  suicidal thoughts or a previous suicide attempt  an unusual reaction to desvenlafaxine, venlafaxine, other medicines, foods, dyes, or preservatives  pregnant or trying to get pregnant  breast-feeding How should I use this medicine? Take this medicine by mouth with a drink of water. Do not crush, cut or chew. Follow the directions on the prescription label. Take your doses at regular intervals. Do not take your medicine more often than directed. Do not stop taking this medicine suddenly except upon the advice of your doctor. Stopping this medicine too quickly may cause serious side effects or your condition may worsen. Contact your pediatrician or health care professional regarding the use of this medicine in children. Special care may be needed. A special MedGuide will be given to you by the pharmacist with each prescription and refill. Be sure to read this information carefully each time. Overdosage: If you think you have taken too much of this medicine contact a poison control center or emergency room at once. NOTE: This medicine is only for you. Do not share this medicine with others. What if I miss a dose? If you miss a dose, take it as soon as you can. If it is almost time for your next dose, take only that dose. Do not take double or extra doses. What may interact with this medicine? Do not take this medicine with any of the following  medications:  duloxetine  levomilnacipran  linezolid  MAOIs like Carbex, Eldepryl, Marplan, Nardil, and Parnate  methylene blue (injected into a vein)  milnacipran  venlafaxine This medicine may also interact with the following medications:  alcohol  amphetamines  aspirin and aspirin-like medicines  certain migraine headache medicines (almotriptan, eletriptan, frovatriptan, naratriptan, rizatriptan, sumatriptan, zolmitriptan)  dexfenfluramine or fenfluramine  furazolidone  isoniazid  lithium  medicines for heart rhythm or blood pressure  medicines that treat or prevent blood clots like warfarin, enoxaparin, and dalteparin  methylphenidate  metoclopramide  NSAIDS, medicines for pain and inflammation, like ibuprofen or naproxen  pentazocine  phentermine  procarbazine  protriptyline  rasagiline  sibutramine  St. John's wort, Hypericum perforatum  tramadol  tryptophan  zolpidem This list may not describe all possible interactions. Give your health care provider a list of all the medicines, herbs, non-prescription drugs, or dietary supplements you use. Also tell them if you smoke, drink alcohol, or use illegal drugs. Some items may interact with your medicine. What should I watch for while using this medicine? Tell your doctor if your symptoms do not get better or if they get worse. Visit your doctor or health care professional for regular checks on your progress. Because it may take several weeks to see the full effects of this medicine, it is important to continue your treatment as prescribed by your doctor. Patients and their families should watch out for new or worsening thoughts of suicide or depression. Also watch out for sudden changes in feelings such as feeling anxious, agitated, panicky, irritable, hostile, aggressive, impulsive, severely restless, overly excited and hyperactive, or not  being able to sleep. If this happens, especially at the  beginning of treatment or after a change in dose, call your health care professional. This medicine can cause an increase in blood pressure. Check with your doctor for instructions on monitoring your blood pressure while taking this medicine. You may get drowsy or dizzy. Do not drive, use machinery, or do anything that needs mental alertness until you know how this medicine affects you. Do not stand or sit up quickly, especially if you are an older patient. This reduces the risk of dizzy or fainting spells. Alcohol may interfere with the effect of this medicine. Avoid alcoholic drinks. Your mouth may get dry. Chewing sugarless gum, sucking hard candy and drinking plenty of water will help. Contact your doctor if the problem does not go away or is severe. What side effects may I notice from receiving this medicine? Side effects that you should report to your doctor or health care professional as soon as possible:  allergic reactions like skin rash, itching or hives, swelling of the face, lips, or tongue  anxious  breathing problems  confusion  changes in vision  chest pain  confusion  elevated mood, decreased need for sleep, racing thoughts, impulsive behavior  eye pain  fast, irregular heartbeat  feeling faint or lightheaded, falls  feeling agitated, angry, or irritable  hallucination, loss of contact with reality  high blood pressure  loss of balance or coordination  palpitations  redness, blistering, peeling or loosening of the skin, including inside the mouth  restlessness, pacing, inability to keep still  seizures  stiff muscles  suicidal thoughts or other mood changes  trouble passing urine or change in the amount of urine  trouble sleeping  unusual bleeding or bruising  unusually weak or tired  vomiting Side effects that usually do not require medical attention (report to your doctor or health care professional if they continue or are  bothersome):  change in sex drive or performance  change in appetite or weight  constipation  dizziness  dry mouth  headache  increased sweating  nausea  tired This list may not describe all possible side effects. Call your doctor for medical advice about side effects. You may report side effects to FDA at 1-800-FDA-1088. Where should I keep my medicine? Keep out of the reach of children. Store at room temperature between 15 and 30 degrees C (59 and 86 degrees F). Throw away any unused medicine after the expiration date. NOTE: This sheet is a summary. It may not cover all possible information. If you have questions about this medicine, talk to your doctor, pharmacist, or health care provider.  2020 Elsevier/Gold Standard (2015-09-03 17:35:48)

## 2020-02-15 ENCOUNTER — Other Ambulatory Visit: Payer: Self-pay | Admitting: Psychiatry

## 2020-02-15 DIAGNOSIS — F411 Generalized anxiety disorder: Secondary | ICD-10-CM

## 2020-02-20 NOTE — Progress Notes (Signed)
PCP:  Glori Luis, MD   Chief Complaint  Patient presents with  . Gynecologic Exam     HPI:      [name] is a 34 y.o. G1P0102 whose LMP was Patient's last menstrual period was 02/10/2020., presents today for her annual examination.  Her menses are regular every 28-30 days, lasting 4 days.  Dysmenorrhea mild, occurring first 1-2 days of flow. She does not usually have intermenstrual bleeding. Started yaz 9/21 for anxiety with worsening sx/emotional lability before her menses. Sx improved with yaz, wants to continue. Hx of anemia requiring iron transfusions in past. Normal CBC 8/21  Sex activity: single partner, contraception - OCP (estrogen/progesterone).  Last Pap: 01/23/19  Results were: no abnormalities /neg HPV DNA   There is no FH of breast cancer. There is no FH of ovarian cancer. The patient does do self-breast exams.  Tobacco use: The patient denies current or previous tobacco use. Alcohol use: none No drug use.  Exercise: moderately active  She does get adequate calcium and Vitamin D in her diet.   Past Medical History:  Diagnosis Date  . Allergic reaction 05/31/2017  . Anxiety   . Depression   . IDA (iron deficiency anemia) 02/01/2019  . IDA (iron deficiency anemia) 02/01/2019    Past Surgical History:  Procedure Laterality Date  . CESAREAN SECTION  09/05/2016  . CHOLECYSTECTOMY  07 31 2012  . GASTRIC BYPASS      Family History  Problem Relation Age of Onset  . Alcohol abuse Mother   . Anxiety disorder Mother   . Depression Mother   . Alcohol abuse Sister   . Anxiety disorder Sister   . Depression Sister   . Diabetes Neg Hx     Social History   Socioeconomic History  . Marital status: Married    Spouse name: benjamin  . Number of children: 2  . Years of education: Not on file  . Highest education level: Master's degree (e.g., MA, MS, MEng, MEd, MSW, MBA)  Occupational History  . Not on file  Tobacco Use  . Smoking status: Former  Smoker    Quit date: 11/05/2010    Years since quitting: 9.3  . Smokeless tobacco: Never Used  Vaping Use  . Vaping Use: Never used  Substance and Sexual Activity  . Alcohol use: Not Currently    Alcohol/week: 2.0 standard drinks    Types: 2 Glasses of wine per week  . Drug use: No  . Sexual activity: Yes    Birth control/protection: Pill  Other Topics Concern  . Not on file  Social History Narrative  . Not on file   Social Determinants of Health   Financial Resource Strain: Low Risk   . Difficulty of Paying Living Expenses: Not hard at all  Food Insecurity: No Food Insecurity  . Worried About Programme researcher, broadcasting/film/video in the Last Year: Never true  . Ran Out of Food in the Last Year: Never true  Transportation Needs: No Transportation Needs  . Lack of Transportation (Medical): No  . Lack of Transportation (Non-Medical): No  Physical Activity: Inactive  . Days of Exercise per Week: 0 days  . Minutes of Exercise per Session: 0 min  Stress: Stress Concern Present  . Feeling of Stress : Rather much  Social Connections: Unknown  . Frequency of Communication with Friends and Family: Not on file  . Frequency of Social Gatherings with Friends and Family: Not on file  . Attends  Religious Services: Never  . Active Member of Clubs or Organizations: No  . Attends Banker Meetings: Never  . Marital Status: Married  Catering manager Violence: Not At Risk  . Fear of Current or Ex-Partner: No  . Emotionally Abused: No  . Physically Abused: No  . Sexually Abused: No     Current Outpatient Medications:  .  Biotin 27253 MCG TABS, Take 10,000 mcg by mouth daily., Disp: , Rfl:  .  buPROPion (WELLBUTRIN XL) 150 MG 24 hr tablet, Take 1 tablet (150 mg total) by mouth daily., Disp: 90 tablet, Rfl: 0 .  Cholecalciferol (VITAMIN D) 50 MCG (2000 UT) tablet, Take 2,000 Units by mouth daily., Disp: , Rfl:  .  clonazePAM (KLONOPIN) 0.5 MG tablet, Take 1 tablet (0.5 mg total) by mouth daily  as needed for anxiety. For severe anxiety attacks, Disp: 10 tablet, Rfl: 0 .  drospirenone-ethinyl estradiol (YAZ) 3-0.02 MG tablet, Take 1 tablet by mouth daily., Disp: 84 tablet, Rfl: 3 .  ferrous sulfate 325 (65 FE) MG tablet, Take 325 mg by mouth 2 (two) times daily with a meal. , Disp: , Rfl:  .  gabapentin (NEURONTIN) 300 MG capsule, TAKE 1 CAPSULE(300 MG) BY MOUTH THREE TIMES DAILY, Disp: 270 capsule, Rfl: 0 .  hydrOXYzine (ATARAX/VISTARIL) 25 MG tablet, Take 0.5-1 tablets (12.5-25 mg total) by mouth 3 (three) times daily as needed for anxiety. For anxiety, sleep, Disp: 90 tablet, Rfl: 1 .  ibuprofen (ADVIL) 200 MG tablet, Take 400-800 mg by mouth every 6 (six) hours as needed for headache or moderate pain., Disp: , Rfl:  .  Multiple Vitamin (MULTIVITAMIN WITH MINERALS) TABS tablet, Take 1 tablet by mouth at bedtime., Disp: , Rfl:  .  desvenlafaxine (PRISTIQ) 50 MG 24 hr tablet, Take 1 tablet (50 mg total) by mouth at bedtime. (Patient not taking: Reported on 02/24/2020), Disp: 30 tablet, Rfl: 1     ROS:  Review of Systems  Constitutional: Negative for fatigue, fever and unexpected weight change.  Respiratory: Negative for cough, shortness of breath and wheezing.   Cardiovascular: Negative for chest pain, palpitations and leg swelling.  Gastrointestinal: Negative for blood in stool, constipation, diarrhea, nausea and vomiting.  Endocrine: Negative for cold intolerance, heat intolerance and polyuria.  Genitourinary: Negative for dyspareunia, dysuria, flank pain, frequency, genital sores, hematuria, menstrual problem, pelvic pain, urgency, vaginal bleeding, vaginal discharge and vaginal pain.  Musculoskeletal: Negative for back pain, joint swelling and myalgias.  Skin: Negative for rash.  Neurological: Negative for dizziness, syncope, light-headedness, numbness and headaches.  Hematological: Negative for adenopathy.  Psychiatric/Behavioral: Negative for agitation, confusion, sleep  disturbance and suicidal ideas. The patient is not nervous/anxious.   BREAST: No symptoms   Objective: BP 110/80   Ht 5\' 8"  (1.727 m)   Wt 197 lb (89.4 kg)   LMP 02/10/2020   BMI 29.95 kg/m    Physical Exam Constitutional:      Appearance: She is well-developed.  Genitourinary:     Vulva, vagina, cervix, uterus, right adnexa and left adnexa normal.     No vulval lesion or tenderness noted.     No vaginal discharge, erythema or tenderness.     No cervical polyp.     Uterus is not enlarged or tender.     No right or left adnexal mass present.     Right adnexa not tender.     Left adnexa not tender.  Neck:     Thyroid: No thyromegaly.  Cardiovascular:  Rate and Rhythm: Normal rate and regular rhythm.     Heart sounds: Normal heart sounds. No murmur heard.   Pulmonary:     Effort: Pulmonary effort is normal.     Breath sounds: Normal breath sounds.  Chest:     Breasts:        Right: No mass, nipple discharge, skin change or tenderness.        Left: No mass, nipple discharge, skin change or tenderness.  Abdominal:     Palpations: Abdomen is soft.     Tenderness: There is no abdominal tenderness. There is no guarding.  Musculoskeletal:        General: Normal range of motion.     Cervical back: Normal range of motion.  Neurological:     General: No focal deficit present.     Mental Status: She is alert and oriented to person, place, and time.     Cranial Nerves: No cranial nerve deficit.  Skin:    General: Skin is warm and dry.  Psychiatric:        Mood and Affect: Mood normal.        Behavior: Behavior normal.        Thought Content: Thought content normal.        Judgment: Judgment normal.  Vitals reviewed.     Assessment/Plan: Encounter for annual routine gynecological examination  Encounter for surveillance of contraceptive pills - Plan: drospirenone-ethinyl estradiol (YAZ) 3-0.02 MG tablet; OCP RF  PMDD (premenstrual dysphoric disorder)--improved with  Yaz. F/u prn.   Meds ordered this encounter  Medications  . drospirenone-ethinyl estradiol (YAZ) 3-0.02 MG tablet    Sig: Take 1 tablet by mouth daily.    Dispense:  84 tablet    Refill:  3    Order Specific Question:   Supervising Provider    Answer:   Nadara Mustard [[id number]]             GYN counsel adequate intake of calcium and vitamin D, diet and exercise     F/U  Return in about 1 year (around 02/23/2021).  Cruz Devilla B. Detrell Umscheid, PA-C 02/24/2020 8:25 AM

## 2020-02-24 ENCOUNTER — Encounter: Payer: Self-pay | Admitting: Obstetrics and Gynecology

## 2020-02-24 ENCOUNTER — Ambulatory Visit (INDEPENDENT_AMBULATORY_CARE_PROVIDER_SITE_OTHER): Payer: Managed Care, Other (non HMO) | Admitting: Obstetrics and Gynecology

## 2020-02-24 ENCOUNTER — Other Ambulatory Visit: Payer: Self-pay

## 2020-02-24 VITALS — BP 110/80 | Ht 68.0 in | Wt 197.0 lb

## 2020-02-24 DIAGNOSIS — Z3041 Encounter for surveillance of contraceptive pills: Secondary | ICD-10-CM | POA: Diagnosis not present

## 2020-02-24 DIAGNOSIS — F3281 Premenstrual dysphoric disorder: Secondary | ICD-10-CM | POA: Diagnosis not present

## 2020-02-24 DIAGNOSIS — Z01419 Encounter for gynecological examination (general) (routine) without abnormal findings: Secondary | ICD-10-CM | POA: Diagnosis not present

## 2020-02-24 MED ORDER — DROSPIRENONE-ETHINYL ESTRADIOL 3-0.02 MG PO TABS: 1.0000 | ORAL_TABLET | Freq: Every day | ORAL | 3 refills | Status: DC

## 2020-02-24 NOTE — Patient Instructions (Signed)
I value your feedback and entrusting us with your care. If you get a [facility] patient survey, I would appreciate you taking the time to let us know about your experience today. Thank you!  As of March 28, 2019, your lab results will be released to your MyChart immediately, before I even have a chance to see them. Please give me time to review them and contact you if there are any abnormalities. Thank you for your patience.  

## 2020-02-27 ENCOUNTER — Telehealth: Payer: 59 | Admitting: Psychiatry

## 2020-03-03 ENCOUNTER — Telehealth (INDEPENDENT_AMBULATORY_CARE_PROVIDER_SITE_OTHER): Payer: 59 | Admitting: Psychiatry

## 2020-03-03 ENCOUNTER — Other Ambulatory Visit: Payer: Self-pay

## 2020-03-03 DIAGNOSIS — Z5329 Procedure and treatment not carried out because of patient's decision for other reasons: Secondary | ICD-10-CM

## 2020-03-03 NOTE — Progress Notes (Signed)
No response to call or text or video invite  

## 2020-03-30 ENCOUNTER — Other Ambulatory Visit: Payer: Self-pay | Admitting: Child and Adolescent Psychiatry

## 2020-03-30 ENCOUNTER — Other Ambulatory Visit: Payer: Self-pay | Admitting: Psychiatry

## 2020-03-30 DIAGNOSIS — F411 Generalized anxiety disorder: Secondary | ICD-10-CM

## 2020-03-30 DIAGNOSIS — F330 Major depressive disorder, recurrent, mild: Secondary | ICD-10-CM

## 2020-03-30 DIAGNOSIS — F3342 Major depressive disorder, recurrent, in full remission: Secondary | ICD-10-CM

## 2020-03-30 NOTE — Telephone Encounter (Signed)
Dr. Eappen's pt

## 2020-04-29 ENCOUNTER — Other Ambulatory Visit: Payer: Self-pay | Admitting: Psychiatry

## 2020-04-29 DIAGNOSIS — F411 Generalized anxiety disorder: Secondary | ICD-10-CM

## 2020-05-18 ENCOUNTER — Ambulatory Visit
Admission: RE | Admit: 2020-05-18 | Discharge: 2020-05-18 | Disposition: A | Discharge status: Home / Self Care | Payer: Managed Care, Other (non HMO) | Source: Ambulatory Visit | Attending: Family Medicine | Admitting: Family Medicine

## 2020-05-18 ENCOUNTER — Encounter: Payer: Self-pay | Admitting: Family Medicine

## 2020-05-18 ENCOUNTER — Other Ambulatory Visit: Payer: Self-pay

## 2020-05-18 ENCOUNTER — Telehealth (INDEPENDENT_AMBULATORY_CARE_PROVIDER_SITE_OTHER): Payer: Managed Care, Other (non HMO) | Admitting: Family Medicine

## 2020-05-18 VITALS — Ht 68.0 in | Wt 187.0 lb

## 2020-05-18 DIAGNOSIS — R059 Cough, unspecified: Secondary | ICD-10-CM | POA: Insufficient documentation

## 2020-05-18 DIAGNOSIS — Z8616 Personal history of COVID-19: Secondary | ICD-10-CM | POA: Insufficient documentation

## 2020-05-18 MED ORDER — PREDNISONE 20 MG PO TABS: 40.0000 mg | ORAL_TABLET | Freq: Every day | ORAL | 0 refills | Status: DC

## 2020-05-18 MED ORDER — ALBUTEROL SULFATE HFA 108 (90 BASE) MCG/ACT IN AERS: 2.0000 | INHALATION_SPRAY | Freq: Four times a day (QID) | RESPIRATORY_TRACT | 0 refills | Status: DC | PRN

## 2020-05-18 NOTE — Progress Notes (Signed)
Virtual Visit via video Note  This visit type was conducted due to national recommendations for restrictions regarding the COVID-19 pandemic (e.g. social distancing).  This format is felt to be most appropriate for this patient at this time.  All issues noted in this document were discussed and addressed.  No physical exam was performed (except for noted visual exam findings with Video Visits).   I connected with Mary Fox today at  4:00 PM EST by a video enabled telemedicine application and verified that I am speaking with the correct person using two identifiers. Location patient: car Location provider: work  Persons participating in the virtual visit: patient, provider  I discussed the limitations, risks, security and privacy concerns of performing an evaluation and management service by telephone and the availability of in person appointments. I also discussed with the patient that there may be a patient responsible charge related to this service. The patient expressed understanding and agreed to proceed.  Reason for visit: same day visit  HPI: History of COVID-19: Patient reports having been diagnosed with COVID-19 in late December.  She started out with runny nose though then developed significant cough.  She notes overall she has improved though she does continue to have fairly significant cough that does cause some discomfort in her back at times.  She does produce some green mucus occasionally if she really tries to cough something up.  No sinus congestion.  No postnasal drip.  No fevers.  No shortness of breath.  She does note she is still wheezing some.  She thinks she may have had Covid pneumonia previously though she did not get evaluated in the emergency department given how busy the ED's were at that time.  She did use her son's albuterol nebulizer to help loosen things up and notes that was beneficial.   ROS: See pertinent positives and negatives per HPI.  Past Medical  History:  Diagnosis Date  . Allergic reaction 05/31/2017  . Anxiety   . Depression   . IDA (iron deficiency anemia) 02/01/2019  . IDA (iron deficiency anemia) 02/01/2019    Past Surgical History:  Procedure Laterality Date  . CESAREAN SECTION  09/05/2016  . CHOLECYSTECTOMY  07 31 2012  . GASTRIC BYPASS      Family History  Problem Relation Age of Onset  . Alcohol abuse Mother   . Anxiety disorder Mother   . Depression Mother   . Alcohol abuse Sister   . Anxiety disorder Sister   . Depression Sister   . Diabetes Neg Hx     SOCIAL HX: Former smoker   Current Outpatient Medications:  .  albuterol (VENTOLIN HFA) 108 (90 Base) MCG/ACT inhaler, Inhale 2 puffs into the lungs every 6 (six) hours as needed for wheezing or shortness of breath., Disp: 8 g, Rfl: 0 .  Biotin 99371 MCG TABS, Take 10,000 mcg by mouth daily., Disp: , Rfl:  .  buPROPion (WELLBUTRIN XL) 150 MG 24 hr tablet, TAKE 1 TABLET(150 MG) BY MOUTH DAILY, Disp: 90 tablet, Rfl: 0 .  Cholecalciferol (VITAMIN D) 50 MCG (2000 UT) tablet, Take 2,000 Units by mouth daily., Disp: , Rfl:  .  clonazePAM (KLONOPIN) 0.5 MG tablet, Take 1 tablet (0.5 mg total) by mouth daily as needed for anxiety. For severe anxiety attacks, Disp: 10 tablet, Rfl: 0 .  desvenlafaxine (PRISTIQ) 50 MG 24 hr tablet, TAKE 1 TABLET(50 MG) BY MOUTH AT BEDTIME, Disp: 30 tablet, Rfl: 0 .  drospirenone-ethinyl estradiol (YAZ) 3-0.02 MG  tablet, Take 1 tablet by mouth daily., Disp: 84 tablet, Rfl: 3 .  ferrous sulfate 325 (65 FE) MG tablet, Take 325 mg by mouth 2 (two) times daily with a meal. , Disp: , Rfl:  .  gabapentin (NEURONTIN) 300 MG capsule, TAKE 1 CAPSULE(300 MG) BY MOUTH THREE TIMES DAILY, Disp: 270 capsule, Rfl: 0 .  hydrOXYzine (ATARAX/VISTARIL) 25 MG tablet, Take 0.5-1 tablets (12.5-25 mg total) by mouth 3 (three) times daily as needed for anxiety. For anxiety, sleep, Disp: 90 tablet, Rfl: 1 .  ibuprofen (ADVIL) 200 MG tablet, Take 400-800 mg by  mouth every 6 (six) hours as needed for headache or moderate pain., Disp: , Rfl:  .  Multiple Vitamin (MULTIVITAMIN WITH MINERALS) TABS tablet, Take 1 tablet by mouth at bedtime., Disp: , Rfl:  .  predniSONE (DELTASONE) 20 MG tablet, Take 2 tablets (40 mg total) by mouth daily with breakfast., Disp: 10 tablet, Rfl: 0  EXAM:  VITALS per patient if applicable:  GENERAL: alert, oriented, appears well and in no acute distress  HEENT: atraumatic, conjunttiva clear, no obvious abnormalities on inspection of external nose and ears  NECK: normal movements of the head and neck  LUNGS: on inspection no signs of respiratory distress, breathing rate appears normal, no obvious gross SOB, gasping or wheezing  CV: no obvious cyanosis  MS: moves all visible extremities without noticeable abnormality  PSYCH/NEURO: pleasant and cooperative, no obvious depression or anxiety, speech and thought processing grossly intact  ASSESSMENT AND PLAN:  Discussed the following assessment and plan:  Problem List Items Addressed This Visit    History of COVID-19 - Primary    The patient has had a persistent cough after having had COVID-19 with onset of symptoms a little over a month ago.  Discussed the need for chest x-ray.  [facility] this.  It sounds as though there is some reactive airway component and thus will treat with prednisone and an albuterol inhaler.  If the x-ray reveals signs of pneumonia we will treat with antibiotics.  Advised to seek medical attention for hemoptysis and shortness of breath.  Discussed the potential for pulmonology evaluation if her symptoms do not improve with current treatments.       Other Visit Diagnoses    Cough       Relevant Medications   predniSONE (DELTASONE) 20 MG tablet   albuterol (VENTOLIN HFA) 108 (90 Base) MCG/ACT inhaler   Other Relevant Orders   DG Chest 2 View       I discussed the assessment and treatment plan with  the patient. The patient was provided an opportunity to ask questions and all were answered. The patient agreed with the plan and demonstrated an understanding of the instructions.   The patient was advised to call back or seek an in-person evaluation if the symptoms worsen or if the condition fails to improve as anticipated.   Marikay Alar, MD

## 2020-05-18 NOTE — Assessment & Plan Note (Signed)
The patient has had a persistent cough after having had COVID-19 with onset of symptoms a little over a month ago.  Discussed the need for chest x-ray.  She [facility] medical mall [facility] the [facility] for this.  It sounds as though there is some reactive airway component and thus will treat with prednisone and an albuterol inhaler.  If the x-ray reveals signs of pneumonia we will treat with antibiotics.  Advised to seek medical attention for hemoptysis and shortness of breath.  Discussed the potential for pulmonology evaluation if her symptoms do not improve with current treatments.

## 2020-06-09 ENCOUNTER — Other Ambulatory Visit: Payer: Self-pay | Admitting: Family Medicine

## 2020-06-09 DIAGNOSIS — R059 Cough, unspecified: Secondary | ICD-10-CM

## 2020-06-10 ENCOUNTER — Inpatient Hospital Stay: Payer: Managed Care, Other (non HMO)

## 2020-06-12 ENCOUNTER — Inpatient Hospital Stay: Payer: Managed Care, Other (non HMO) | Admitting: Oncology

## 2020-06-12 ENCOUNTER — Telehealth: Payer: Self-pay

## 2020-06-12 ENCOUNTER — Other Ambulatory Visit: Payer: Self-pay

## 2020-06-12 NOTE — Telephone Encounter (Signed)
I tried calling pt in reference to her NS 06/10/20 lab appt and her sched 06/12/20 MyChart Visit Pt didn't answer. Both appts were R/S. A detailed message was left on pts VM making her  aware of the R/S dates and times and to contact the office by phone or through MyChart if the  R/S dates and time didn't work for her.

## 2020-06-12 NOTE — Telephone Encounter (Signed)
Patient did not come for lab appt on 2/23 and did not answer phone for virtual visit today.  Please contact patient to have appts r/s.

## 2020-06-15 NOTE — Progress Notes (Signed)
No show

## 2020-06-18 ENCOUNTER — Telehealth (INDEPENDENT_AMBULATORY_CARE_PROVIDER_SITE_OTHER): Payer: 59 | Admitting: Psychiatry

## 2020-06-18 ENCOUNTER — Encounter: Payer: Self-pay | Admitting: Psychiatry

## 2020-06-18 ENCOUNTER — Other Ambulatory Visit: Payer: Self-pay

## 2020-06-18 DIAGNOSIS — F3342 Major depressive disorder, recurrent, in full remission: Secondary | ICD-10-CM

## 2020-06-18 DIAGNOSIS — F988 Other specified behavioral and emotional disorders with onset usually occurring in childhood and adolescence: Secondary | ICD-10-CM | POA: Insufficient documentation

## 2020-06-18 DIAGNOSIS — Z9111 Patient's noncompliance with dietary regimen: Secondary | ICD-10-CM | POA: Diagnosis not present

## 2020-06-18 DIAGNOSIS — R41840 Attention and concentration deficit: Secondary | ICD-10-CM

## 2020-06-18 DIAGNOSIS — F411 Generalized anxiety disorder: Secondary | ICD-10-CM

## 2020-06-18 DIAGNOSIS — F39 Unspecified mood [affective] disorder: Secondary | ICD-10-CM | POA: Diagnosis not present

## 2020-06-18 DIAGNOSIS — Z91199 Patient's noncompliance with other medical treatment and regimen due to unspecified reason: Secondary | ICD-10-CM | POA: Insufficient documentation

## 2020-06-18 MED ORDER — HYDROXYZINE HCL 25 MG PO TABS: 12.5000 mg | ORAL_TABLET | Freq: Three times a day (TID) | ORAL | 1 refills | Status: DC | PRN

## 2020-06-18 MED ORDER — GABAPENTIN 300 MG PO CAPS: ORAL_CAPSULE | ORAL | 0 refills | Status: DC

## 2020-06-18 MED ORDER — DULOXETINE HCL 60 MG PO CPEP: 60.0000 mg | ORAL_CAPSULE | Freq: Every day | ORAL | 0 refills | Status: DC

## 2020-06-18 MED ORDER — BUPROPION HCL ER (XL) 150 MG PO TB24: ORAL_TABLET | ORAL | 0 refills | Status: DC

## 2020-06-18 NOTE — Progress Notes (Unsigned)
Virtual Visit via Video Note  I connected with Mary Fox on 06/18/20 at  9:30 AM EST by a video enabled telemedicine application and verified that I am speaking with the correct person using two identifiers.  Location Provider Location : [facility] Patient Location : Car  Participants: Patient , Provider   I discussed the limitations of evaluation and management by telemedicine and the availability of in person appointments. The patient expressed understanding and agreed to proceed.   I discussed the assessment and treatment plan with the patient. The patient was provided an opportunity to ask questions and all were answered. The patient agreed with the plan and demonstrated an understanding of the instructions.   The patient was advised to call back or seek an in-person evaluation if the symptoms worsen or if the condition fails to improve as anticipated.   [facility] MD OP Progress Note  06/18/2020 7:20 PM Lorie Cleckley  MRN:  [id number]  Chief Complaint:  Chief Complaint    Follow-up; Anxiety     HPI: Mary Fox is a 35 year old Caucasian female, married, employed, lives in [place], has a history of GAD, MDD, iron deficiency anemia, gastroesophageal reflux disease was evaluated by telemedicine today.  Patient was seen last on 01/23/2020.  Patient today reports that she was frustrated with the way she was feeling and also did not want to take a lot of medications.  She reports hence even though she was started on Pristiq and her Cymbalta was discontinued last visit to address her mood symptoms she did not start the Pristiq.  She reports she got an evaluation done with [facility]-virtually and they gave her screening questionnaires and diagnosed her with ADHD and started her on Adderall.  She reports she was tried on multiple other medications prior to the Adderall-she did not elaborate on what medications.  She reports ever since she started the Adderall she has been feeling much better.   She reports her boss has noticed an improvement in the way she functions at work.  She reports she has returned to the evaluation today since she wants to have all her medications managed at one place and wants writer to prescribe her the Adderall.  Patient denies any significant anxiety symptoms today.  Patient denies any suicidality, homicidality or perceptual disturbances.  Patient reports sleep is okay.  Patient did not express any significant obsessions or compulsive behaviors.  She reports she is compliant on the gabapentin, Wellbutrin as prescribed and wants to stay on these medications at this time.  Patient denies any other concerns today.  Visit Diagnosis: R/O OCD   ICD-10-CM   1. GAD (generalized anxiety disorder)  F41.1 DULoxetine (CYMBALTA) 60 MG capsule    gabapentin (NEURONTIN) 300 MG capsule    hydrOXYzine (ATARAX/VISTARIL) 25 MG tablet  2. Episodic mood disorder (HCC)  F39 DULoxetine (CYMBALTA) 60 MG capsule  3. MDD (major depressive disorder), recurrent, in full remission ([place])  F33.42 buPROPion (WELLBUTRIN XL) 150 MG 24 hr tablet  4. Noncompliance with treatment plan  Z91.11   5. Attention and concentration deficit  R41.840     Past Psychiatric History: I have reviewed past psychiatric history from my progress note on 03/07/2019.  Past trials of BuSpar, Effexor, Seroquel-noncompliant, Lexapro, Paxil, Wellbutrin, Zoloft, Klonopin  Past Medical History:  Past Medical History:  Diagnosis Date  . Allergic reaction 05/31/2017  . Anxiety   . Depression   . IDA (iron deficiency anemia) 02/01/2019  . IDA (iron deficiency anemia) 02/01/2019  Past Surgical History:  Procedure Laterality Date  . CESAREAN SECTION  09/05/2016  . CHOLECYSTECTOMY  07 31 2012  . GASTRIC BYPASS      Family Psychiatric History: I have reviewed family psychiatric history from my progress note on 03/07/2019.  Family History:  Family History  Problem Relation Age of Onset  . Alcohol  abuse Mother   . Anxiety disorder Mother   . Depression Mother   . Alcohol abuse Sister   . Anxiety disorder Sister   . Depression Sister   . Diabetes Neg Hx     Social History: I have reviewed social history from my progress note on 03/07/2019. Social History   Socioeconomic History  . Marital status: Married    Spouse name: benjamin  . Number of children: 2  . Years of education: Not on file  . Highest education level: Master's degree (e.g., MA, MS, MEng, MEd, MSW, MBA)  Occupational History  . Not on file  Tobacco Use  . Smoking status: Former Smoker    Quit date: 11/05/2010    Years since quitting: 9.6  . Smokeless tobacco: Never Used  Vaping Use  . Vaping Use: Never used  Substance and Sexual Activity  . Alcohol use: Not Currently    Alcohol/week: 2.0 standard drinks    Types: 2 Glasses of wine per week  . Drug use: No  . Sexual activity: Yes    Birth control/protection: Pill  Other Topics Concern  . Not on file  Social History Narrative  . Not on file   Social Determinants of Health   Financial Resource Strain: Not on file  Food Insecurity: Not on file  Transportation Needs: Not on file  Physical Activity: Not on file  Stress: Not on file  Social Connections: Not on file    Allergies: No Known Allergies  Metabolic Disorder Labs: No results found for: HGBA1C, MPG No results found for: PROLACTIN Lab Results  Component Value Date   CHOL 131 01/28/2019   TRIG 46 01/28/2019   HDL 64 01/28/2019   CHOLHDL 2.0 01/28/2019   LDLCALC 56 01/28/2019   Lab Results  Component Value Date   TSH 1.380 01/28/2019   TSH 3.965 11/19/2014    Therapeutic Level Labs: No results found for: LITHIUM No results found for: VALPROATE No components found for:  CBMZ  Current Medications: Current Outpatient Medications  Medication Sig Dispense Refill  . DULoxetine (CYMBALTA) 60 MG capsule Take 1 capsule (60 mg total) by mouth daily. 90 capsule 0  . NIFEdipine  (PROCARDIA XL/NIFEDICAL XL) 60 MG 24 hr tablet Take 1 tablet by mouth daily.    Marland Kitchen albuterol (VENTOLIN HFA) 108 (90 Base) MCG/ACT inhaler INHALE 2 PUFFS INTO THE LUNGS EVERY 6 HOURS AS NEEDED FOR WHEEZING OR SHORTNESS OF BREATH 6.7 g 1  . amphetamine-dextroamphetamine (ADDERALL) 10 MG tablet Take 10 mg by mouth 2 (two) times daily.    Marland Kitchen amphetamine-dextroamphetamine (ADDERALL) 15 MG tablet Take 1 tablet by mouth 3 (three) times daily.    Marland Kitchen amphetamine-dextroamphetamine (ADDERALL) 5 MG tablet Take 1 tablet by mouth every morning.    . Biotin 10000 MCG TABS Take 10,000 mcg by mouth daily.    Marland Kitchen buPROPion (WELLBUTRIN XL) 150 MG 24 hr tablet TAKE 1 TABLET(150 MG) BY MOUTH DAILY 90 tablet 0  . Cholecalciferol (VITAMIN D) 50 MCG (2000 UT) tablet Take 2,000 Units by mouth daily.    . ciprofloxacin (CILOXAN) 0.3 % ophthalmic solution     . clonazePAM (KLONOPIN)  0.5 MG tablet Take 1 tablet (0.5 mg total) by mouth daily as needed for anxiety. For severe anxiety attacks 10 tablet 0  . COVID-19 Specimen Collection KIT See admin instructions. for testing    . desvenlafaxine (PRISTIQ) 50 MG 24 hr tablet TAKE 1 TABLET(50 MG) BY MOUTH AT BEDTIME 30 tablet 0  . drospirenone-ethinyl estradiol (YAZ) 3-0.02 MG tablet Take 1 tablet by mouth daily. 84 tablet 3  . ferrous sulfate 325 (65 FE) MG tablet Take 325 mg by mouth 2 (two) times daily with a meal.     . gabapentin (NEURONTIN) 300 MG capsule TAKE 1 CAPSULE(300 MG) BY MOUTH THREE TIMES DAILY 270 capsule 0  . hydrOXYzine (ATARAX/VISTARIL) 25 MG tablet Take 0.5-1 tablets (12.5-25 mg total) by mouth 3 (three) times daily as needed for anxiety. For anxiety, sleep 90 tablet 1  . ibuprofen (ADVIL) 200 MG tablet Take 400-800 mg by mouth every 6 (six) hours as needed for headache or moderate pain.    . Multiple Vitamin (MULTIVITAMIN WITH MINERALS) TABS tablet Take 1 tablet by mouth at bedtime.    . predniSONE (DELTASONE) 20 MG tablet Take 2 tablets (40 mg total) by mouth  daily with breakfast. 10 tablet 0   No current facility-administered medications for this visit.     Musculoskeletal: Strength & Muscle Tone: UTA Gait & Station: UTA Patient leans: N/A  Psychiatric Specialty Exam: Review of Systems  Psychiatric/Behavioral: Positive for decreased concentration. The patient is nervous/anxious.   All other systems reviewed and are negative.   There were no vitals taken for this visit.There is no height or weight on file to calculate BMI.  General Appearance: Casual  Eye Contact:  Fair  Speech:  Clear and Coherent  Volume:  Normal  Mood:  Anxious Improving  Affect:  Appropriate  Thought Process:  Goal Directed and Descriptions of Associations: Intact  Orientation:  Full (Time, Place, and Person)  Thought Content: Logical   Suicidal Thoughts:  No  Homicidal Thoughts:  No  Memory:  Immediate;   Fair Recent;   Fair Remote;   Fair  Judgement:  Fair  Insight:  Shallow  Psychomotor Activity:  Normal  Concentration:  Concentration: Fair and Attention Span: Fair  Recall:  [employer] of Knowledge: Fair  Language: Fair  Akathisia:  No  Handed:  Right  AIMS (if indicated): UTA  Assets:  Communication Skills Desire for Improvement Housing Social Support  ADL's:  Intact  Cognition: WNL  Sleep:  Fair   Screenings: GAD-7   Flowsheet Row Office Visit from 12/07/2017 in [facility] Office Visit from 06/12/2015 in [facility] Office Visit from 04/02/2015 in [facility]  Total GAD-7 Score '14 11 18    ' PHQ2-9   Flowsheet Row Video Visit from 06/18/2020 in [facility] Video Visit from 05/18/2020 in [facility] Video Visit from 07/23/2019 in [facility] Office Visit from 01/21/2019 in [facility] Office Visit from 02/06/2018 in [facility]  PHQ-2 Total Score 0 0 0 1 2  PHQ-9 Total Score - - - 5 16     Flowsheet Row Video Visit from 06/18/2020 in [facility] No Risk       Assessment and Plan: Mary Fox is a 35 year old Caucasian female, married, employed, lives in [place], has a history of depression, GAD, iron deficiency anemia was evaluated by telemedicine today.  Patient  is biologically predisposed given her history of mental health problems.  Patient with psychosocial stressors of work-related problems, pandemic.  Patient has been noncompliant with recommendations, returns today requesting to continue her on Adderall prescribed by another provider for possible ADHD although she had a neuropsychological testing done in 2021 that showed she did not have ADHD.Marland Kitchen  Discussed plan as noted below.  Plan GAD-improving Continue gabapentin 300 mg 3 times daily Continue Wellbutrin XL 150 mg p.o. daily Restart Cymbalta 60 mg p.o. daily Continue clonazepam 0.5 mg as needed for severe anxiety attacks. Discontinue Pristiq for noncompliance.  Episodic mood disorder-improving Continue Wellbutrin and gabapentin as prescribed Patient completed mood disorder questionnaire in the past and scored very low. We will continue to explore, will need to rule out bipolar disorder.  MDD in full remission Will monitor closely   Noncompliance with recommendations-patient had ADHD testing completed on 04/04/2019 at [facility]-for ADHD testing-patient did not meet criteria for ADHD.  Her attention and focus problems likely due to underlying OCD symptoms.  She spends a lot of time on every task trying to make sure it is perfect and gets behind and is mentally exhausted.'  Patient was explored for further OCD symptoms in the past as well as today.  However unable to give her a diagnosis of OCD at this time. Patient recently got screening done through a telemedicine appointment with [facility]-according to patient and was started on  Adderall.  Patient today reports she is interested in getting repeat testing since she wants to continue the Adderall since she feels much better when she takes it. Based on patient preference will refer her back for neuropsychological testing.  I have advised patient to sign a consent to obtain medical records from [facility] who diagnosed her with ADHD recently.   Patient to follow-up in clinic after her ADHD testing.  Patient agrees to call the clinic to schedule an appointment.  This note was generated in part or whole with voice recognition software. Voice recognition is usually quite accurate but there are transcription errors that can and very often do occur. I apologize for any typographical errors that were not detected and corrected.          Ursula Alert, MD 06/19/2020, 8:30 AM

## 2020-06-26 ENCOUNTER — Inpatient Hospital Stay: Payer: Managed Care, Other (non HMO)

## 2020-06-28 ENCOUNTER — Other Ambulatory Visit: Payer: Self-pay | Admitting: Psychiatry

## 2020-06-28 DIAGNOSIS — F3342 Major depressive disorder, recurrent, in full remission: Secondary | ICD-10-CM

## 2020-06-29 ENCOUNTER — Telehealth: Payer: Self-pay

## 2020-06-29 ENCOUNTER — Inpatient Hospital Stay: Payer: Managed Care, Other (non HMO) | Admitting: Oncology

## 2020-06-29 NOTE — Telephone Encounter (Signed)
Patient did not come for lab appt on 06/26/20.  Please contact patient to r/s lab with virtual visit 2 days later (this will be the 2nd time to have appts r/s due to not having labs drawn).

## 2020-07-03 ENCOUNTER — Inpatient Hospital Stay: Payer: Managed Care, Other (non HMO) | Attending: Oncology

## 2020-07-06 ENCOUNTER — Other Ambulatory Visit: Payer: Managed Care, Other (non HMO)

## 2020-07-07 ENCOUNTER — Inpatient Hospital Stay: Payer: Managed Care, Other (non HMO) | Admitting: Oncology

## 2020-07-08 ENCOUNTER — Encounter: Payer: Self-pay | Admitting: Oncology

## 2020-11-16 ENCOUNTER — Other Ambulatory Visit: Payer: Self-pay

## 2020-11-16 ENCOUNTER — Ambulatory Visit: Payer: Managed Care, Other (non HMO) | Admitting: Obstetrics and Gynecology

## 2020-11-16 ENCOUNTER — Encounter: Payer: Self-pay | Admitting: Obstetrics and Gynecology

## 2020-11-16 VITALS — BP 124/80 | Ht 68.0 in | Wt 192.0 lb

## 2020-11-16 DIAGNOSIS — N644 Mastodynia: Secondary | ICD-10-CM | POA: Diagnosis not present

## 2020-11-16 NOTE — Progress Notes (Signed)
Mary Haven, MD   Chief Complaint  Patient presents with   Breast Exam    Pain and tenderness x couple of weeks    HPI:      [name] is a 35 y.o. G1P0102 whose LMP was Patient's last menstrual period was 11/12/2020 (exact date)., presents today for bilat breast tenderness for the past couple of wks. Sx started in LT breast and then changed to RT breast (started mid cycle). No masses but breast tissue is lumpy. Pain is an episodic burning/ache sensation, sometimes tender to touch. Doesn't usually do SBE. No prior breast imaging/masses. No FH breast/ovar cancer. Drinks ~72 oz caffeine daily, trying to cut back. Still doing well on yaz OCPs  Annual due 11/22.  Past Medical History:  Diagnosis Date   Allergic reaction 05/31/2017   Anxiety    Depression    IDA (iron deficiency anemia) 02/01/2019   IDA (iron deficiency anemia) 02/01/2019    Past Surgical History:  Procedure Laterality Date   CESAREAN SECTION  09/05/2016   CHOLECYSTECTOMY  07 31 2012   GASTRIC BYPASS      Family History  Problem Relation Age of Onset   Alcohol abuse Mother    Anxiety disorder Mother    Depression Mother    Alcohol abuse Sister    Anxiety disorder Sister    Depression Sister    Diabetes Neg Hx     Social History   Socioeconomic History   Marital status: Married    Spouse name: benjamin   Number of children: 2   Years of education: Not on file   Highest education level: Master's degree (e.g., MA, MS, MEng, MEd, MSW, MBA)  Occupational History   Not on file  Tobacco Use   Smoking status: Former    Types: Cigarettes    Quit date: 11/05/2010    Years since quitting: 10.0   Smokeless tobacco: Never  Vaping Use   Vaping Use: Never used  Substance and Sexual Activity   Alcohol use: Not Currently    Alcohol/week: 2.0 standard drinks    Types: 2 Glasses of wine per week   Drug use: No   Sexual activity: Yes    Birth control/protection: Pill  Other Topics Concern    Not on file  Social History Narrative   Not on file   Social Determinants of Health   Financial Resource Strain: Not on file  Food Insecurity: Not on file  Transportation Needs: Not on file  Physical Activity: Not on file  Stress: Not on file  Social Connections: Not on file  Intimate Partner Violence: Not on file    Outpatient Medications Prior to Visit  Medication Sig Dispense Refill   albuterol (VENTOLIN HFA) 108 (90 Base) MCG/ACT inhaler INHALE 2 PUFFS INTO THE LUNGS EVERY 6 HOURS AS NEEDED FOR WHEEZING OR SHORTNESS OF BREATH 6.7 g 1   amphetamine-dextroamphetamine (ADDERALL) 15 MG tablet Take 1 tablet by mouth 3 (three) times daily.     Biotin 10000 MCG TABS Take 10,000 mcg by mouth daily.     Cholecalciferol (VITAMIN D) 50 MCG (2000 UT) tablet Take 2,000 Units by mouth daily.     clonazePAM (KLONOPIN) 0.5 MG tablet Take 1 tablet (0.5 mg total) by mouth daily as needed for anxiety. For severe anxiety attacks 10 tablet 0   drospirenone-ethinyl estradiol (YAZ) 3-0.02 MG tablet Take 1 tablet by mouth daily. 84 tablet 3   DULoxetine (CYMBALTA) 60 MG capsule Take 1 capsule (  60 mg total) by mouth daily. 90 capsule 0   ferrous sulfate 325 (65 FE) MG tablet Take 325 mg by mouth 2 (two) times daily with a meal.      gabapentin (NEURONTIN) 300 MG capsule TAKE 1 CAPSULE(300 MG) BY MOUTH THREE TIMES DAILY 270 capsule 0   hydrOXYzine (ATARAX/VISTARIL) 25 MG tablet Take 0.5-1 tablets (12.5-25 mg total) by mouth 3 (three) times daily as needed for anxiety. For anxiety, sleep 90 tablet 1   ibuprofen (ADVIL) 200 MG tablet Take 400-800 mg by mouth every 6 (six) hours as needed for headache or moderate pain.     amphetamine-dextroamphetamine (ADDERALL) 10 MG tablet Take 10 mg by mouth 2 (two) times daily.     amphetamine-dextroamphetamine (ADDERALL) 5 MG tablet Take 1 tablet by mouth every morning.     buPROPion (WELLBUTRIN XL) 150 MG 24 hr tablet TAKE 1 TABLET(150 MG) BY MOUTH DAILY 90 tablet 0    ciprofloxacin (CILOXAN) 0.3 % ophthalmic solution      COVID-19 Specimen Collection KIT See admin instructions. for testing     desvenlafaxine (PRISTIQ) 50 MG 24 hr tablet TAKE 1 TABLET(50 MG) BY MOUTH AT BEDTIME 30 tablet 0   Multiple Vitamin (MULTIVITAMIN WITH MINERALS) TABS tablet Take 1 tablet by mouth at bedtime.     NIFEdipine (PROCARDIA XL/NIFEDICAL XL) 60 MG 24 hr tablet Take 1 tablet by mouth daily.     predniSONE (DELTASONE) 20 MG tablet Take 2 tablets (40 mg total) by mouth daily with breakfast. 10 tablet 0   No facility-administered medications prior to visit.      ROS:  Review of Systems  Constitutional:  Negative for fever.  Gastrointestinal:  Negative for blood in stool, constipation, diarrhea, nausea and vomiting.  Genitourinary:  Negative for dyspareunia, dysuria, flank pain, frequency, hematuria, urgency, vaginal bleeding, vaginal discharge and vaginal pain.  Musculoskeletal:  Negative for back pain.  Skin:  Negative for rash.  BREAST: pain   OBJECTIVE:   Vitals:  BP 124/80   Ht 5' 8" (1.727 m)   Wt 192 lb (87.1 kg)   LMP 11/12/2020 (Exact Date)   BMI 29.19 kg/m   Physical Exam Vitals reviewed.  Pulmonary:     Effort: Pulmonary effort is normal.  Chest:  Breasts:    Breasts are symmetrical.     Right: No inverted nipple, mass, nipple discharge, skin change or tenderness.     Left: No inverted nipple, mass, nipple discharge, skin change or tenderness.     Comments: NO MASSES/ NO TENDERNESS ON EXAM; NO ERYTHEMA; SX BILAT OUTER QUADRANTS Musculoskeletal:        General: Normal range of motion.     Cervical back: Normal range of motion.  Skin:    General: Skin is warm and dry.  Neurological:     General: No focal deficit present.     Mental Status: She is alert and oriented to person, place, and time.     Cranial Nerves: No cranial nerve deficit.  Psychiatric:        Mood and Affect: Mood normal.        Behavior: Behavior normal.        Thought  Content: Thought content normal.        Judgment: Judgment normal.    Assessment/Plan: Breast pain--LT initially, then RT. Started midcycle, drinks a lot of caffeine. Neg exam today. D/C caffeine, see if sx improve now that menses finishing. F/u in 1-2 wks if sx persist for mammo/u/s. F/u   sooner prn.      Return if symptoms worsen or fail to improve.  Mars Scheaffer B. Anisia Leija, PA-C 11/16/2020 9:30 AM

## 2021-02-23 NOTE — Progress Notes (Signed)
PCP:  [name], MD   Chief Complaint  Patient presents with   Gynecologic Exam    BTB the last 3 months     HPI:      [name] is a 35 y.o. G1P0102 whose LMP was Patient's last menstrual period was 02/03/2021 (approximate)., presents today for her annual examination.  Her menses are regular every 28-30 days, lasting 3 days.  Dysmenorrhea mild, occurring first 1-2 days of flow. She does not usually have intermenstrual bleeding but has mid cycle the past 3 cycles, no late/missed pills. Started yaz 9/21 for anxiety with worsening sx/emotional lability before her menses with Sx improvement. Hx of anemia requiring iron transfusions in past. Normal CBC 8/21  Sex activity: single partner, contraception - OCP (estrogen/progesterone). Has had worsening dyspareunia past few months, no bleeding.  Last Pap: 01/23/19  Results were: no abnormalities /neg HPV DNA   Pt with increased vag d/c with itching/odor intermittently for past 3-4 months. Treated with monistat-1 a few wks ago with some sx improvement but sx persist.  There is no FH of breast cancer. There is no FH of ovarian cancer. The patient does not do self-breast exams.  Tobacco use: The patient denies current or previous tobacco use. Alcohol use: none No drug use.  Exercise: min active  She does get adequate calcium and Vitamin D in her diet. Labs through work.     Past Medical History:  Diagnosis Date   Allergic reaction 05/31/2017   Anxiety    Depression    IDA (iron deficiency anemia) 02/01/2019   IDA (iron deficiency anemia) 02/01/2019    Past Surgical History:  Procedure Laterality Date   CESAREAN SECTION  09/05/2016   CHOLECYSTECTOMY  07 31 2012   GASTRIC BYPASS      Family History  Problem Relation Age of Onset   Alcohol abuse Mother    Anxiety disorder Mother    Depression Mother    Alcohol abuse Sister    Anxiety disorder Sister    Depression Sister    Diabetes Neg Hx     Social History    Socioeconomic History   Marital status: Married    Spouse name: benjamin   Number of children: 2   Years of education: Not on file   Highest education level: Master's degree (e.g., MA, MS, MEng, MEd, MSW, MBA)  Occupational History   Not on file  Tobacco Use   Smoking status: Former    Types: Cigarettes    Quit date: 11/05/2010    Years since quitting: 10.3   Smokeless tobacco: Never  Vaping Use   Vaping Use: Never used  Substance and Sexual Activity   Alcohol use: Not Currently    Alcohol/week: 2.0 standard drinks    Types: 2 Glasses of wine per week   Drug use: No   Sexual activity: Yes    Birth control/protection: Pill  Other Topics Concern   Not on file  Social History Narrative   Not on file   Social Determinants of Health   Financial Resource Strain: Not on file  Food Insecurity: Not on file  Transportation Needs: Not on file  Physical Activity: Not on file  Stress: Not on file  Social Connections: Not on file  Intimate Partner Violence: Not on file     Current Outpatient Medications:    Biotin 10000 MCG TABS, Take 10,000 mcg by mouth daily., Disp: , Rfl:    Cholecalciferol (VITAMIN D) 50 MCG (2000 UT)  tablet, Take 2,000 Units by mouth daily., Disp: , Rfl:    DULoxetine (CYMBALTA) 60 MG capsule, Take 1 capsule (60 mg total) by mouth daily., Disp: 90 capsule, Rfl: 0   ferrous sulfate 325 (65 FE) MG tablet, Take 325 mg by mouth 2 (two) times daily with a meal. , Disp: , Rfl:    ibuprofen (ADVIL) 200 MG tablet, Take 400-800 mg by mouth every 6 (six) hours as needed for headache or moderate pain., Disp: , Rfl:    drospirenone-ethinyl estradiol (YAZ) 3-0.02 MG tablet, Take 1 tablet by mouth daily., Disp: 84 tablet, Rfl: 3   LORazepam (ATIVAN) 1 MG tablet, Take 1 mg by mouth 2 (two) times daily. (Patient not taking: Reported on 02/24/2021), Disp: , Rfl:      ROS:  Review of Systems  Constitutional:  Negative for fatigue, fever and unexpected weight change.   Respiratory:  Negative for cough, shortness of breath and wheezing.   Cardiovascular:  Negative for chest pain, palpitations and leg swelling.  Gastrointestinal:  Negative for blood in stool, constipation, diarrhea, nausea and vomiting.  Endocrine: Negative for cold intolerance, heat intolerance and polyuria.  Genitourinary:  Positive for dyspareunia, vaginal bleeding and vaginal discharge. Negative for dysuria, flank pain, frequency, genital sores, hematuria, menstrual problem, pelvic pain, urgency and vaginal pain.  Musculoskeletal:  Negative for back pain, joint swelling and myalgias.  Skin:  Negative for rash.  Neurological:  Negative for dizziness, syncope, light-headedness, numbness and headaches.  Hematological:  Negative for adenopathy.  Psychiatric/Behavioral:  Negative for agitation, confusion, sleep disturbance and suicidal ideas. The patient is not nervous/anxious.  BREAST: No symptoms   Objective: BP 120/80   Ht 5\' 7"  (1.702 m)   Wt 182 lb (82.6 kg)   LMP 02/03/2021 (Approximate)   BMI 28.51 kg/m    Physical Exam Constitutional:      Appearance: She is well-developed.  Genitourinary:     Vulva normal.     Right Labia: No rash, tenderness or lesions.    Left Labia: No tenderness, lesions or rash.    No vaginal discharge, erythema or tenderness.      Right Adnexa: not tender and no mass present.    Left Adnexa: not tender and no mass present.    Cervical polyp present.     No cervical friability.     Uterus is not enlarged or tender.  Breasts:    Right: No mass, nipple discharge, skin change or tenderness.     Left: No mass, nipple discharge, skin change or tenderness.  Neck:     Thyroid: No thyromegaly.  Cardiovascular:     Rate and Rhythm: Normal rate and regular rhythm.     Heart sounds: Normal heart sounds. No murmur heard. Pulmonary:     Effort: Pulmonary effort is normal.     Breath sounds: Normal breath sounds.  Abdominal:     Palpations: Abdomen is  soft.     Tenderness: There is no abdominal tenderness. There is no guarding or rebound.  Musculoskeletal:        General: Normal range of motion.     Cervical back: Normal range of motion.  Lymphadenopathy:     Cervical: No cervical adenopathy.  Neurological:     General: No focal deficit present.     Mental Status: She is alert and oriented to person, place, and time.     Cranial Nerves: No cranial nerve deficit.  Skin:    General: Skin is warm and dry.  Psychiatric:        Mood and Affect: Mood normal.        Behavior: Behavior normal.        Thought Content: Thought content normal.        Judgment: Judgment normal.  Vitals reviewed.    Cervix visualized and polyp noted.  Ring forcep applied to cervix and twisting motion removed polyp intact.  Hemostasis obtained.   Assessment/Plan: Encounter for annual routine gynecological examination  Cervical cancer screening - Plan: IGP, Aptima HPV; pap today per pt pref  Screening for HPV (human papillomavirus) - Plan: IGP, Aptima HPV  Encounter for surveillance of contraceptive pills - Plan: drospirenone-ethinyl estradiol (YAZ) 3-0.02 MG tablet; OCP RF, may need to change prn BTB  Breakthrough bleeding on OCPs - Plan: TSH + free T4; endocx polyp removed today, rule out thyroid. See if sx resolve on next pill pack. If sx persist, will check GYN u/s. Pt to f/u via phone prn sx.   Thyroid disorder screening - Plan: TSH + free T4  Endocervical polyp - Plan: Pathology ([facility]), removed today, tolerated well. Sent to path.    Meds ordered this encounter  Medications   drospirenone-ethinyl estradiol (YAZ) 3-0.02 MG tablet    Sig: Take 1 tablet by mouth daily.    Dispense:  84 tablet    Refill:  3    Order Specific Question:   Supervising Provider    Answer:   Gae Dry [id number]              GYN counsel adequate intake of calcium and vitamin D, diet and exercise     F/U  Return in about 1 year (around  02/24/2022).  Gaylon Melchor B. Sheyenne Konz, PA-C 02/24/2021 11:53 AM

## 2021-02-24 ENCOUNTER — Ambulatory Visit (INDEPENDENT_AMBULATORY_CARE_PROVIDER_SITE_OTHER): Payer: Managed Care, Other (non HMO) | Admitting: Obstetrics and Gynecology

## 2021-02-24 ENCOUNTER — Other Ambulatory Visit: Payer: Self-pay

## 2021-02-24 ENCOUNTER — Other Ambulatory Visit: Payer: Self-pay | Admitting: Obstetrics and Gynecology

## 2021-02-24 ENCOUNTER — Encounter: Payer: Self-pay | Admitting: Obstetrics and Gynecology

## 2021-02-24 VITALS — BP 120/80 | Ht 67.0 in | Wt 182.0 lb

## 2021-02-24 DIAGNOSIS — Z1329 Encounter for screening for other suspected endocrine disorder: Secondary | ICD-10-CM

## 2021-02-24 DIAGNOSIS — Z01419 Encounter for gynecological examination (general) (routine) without abnormal findings: Secondary | ICD-10-CM

## 2021-02-24 DIAGNOSIS — Z3041 Encounter for surveillance of contraceptive pills: Secondary | ICD-10-CM

## 2021-02-24 DIAGNOSIS — Z124 Encounter for screening for malignant neoplasm of cervix: Secondary | ICD-10-CM

## 2021-02-24 DIAGNOSIS — N841 Polyp of cervix uteri: Secondary | ICD-10-CM | POA: Diagnosis not present

## 2021-02-24 DIAGNOSIS — N921 Excessive and frequent menstruation with irregular cycle: Secondary | ICD-10-CM

## 2021-02-24 DIAGNOSIS — Z1151 Encounter for screening for human papillomavirus (HPV): Secondary | ICD-10-CM

## 2021-02-24 MED ORDER — DROSPIRENONE-ETHINYL ESTRADIOL 3-0.02 MG PO TABS: 1.0000 | ORAL_TABLET | Freq: Every day | ORAL | 3 refills | Status: DC

## 2021-02-24 NOTE — Patient Instructions (Signed)
I value your feedback and you entrusting us with your care. If you get a [facility] patient survey, I would appreciate you taking the time to let us know about your experience today. Thank you! ? ? ?

## 2021-02-25 LAB — TSH+FREE T4
Free T4: 1.37 ng/dL (ref 0.82–1.77)
TSH: 2.56 u[IU]/mL (ref 0.450–4.500)

## 2021-02-27 LAB — ANATOMIC PATHOLOGY REPORT

## 2021-02-27 LAB — IGP, APTIMA HPV: HPV Aptima: NEGATIVE

## 2021-03-01 ENCOUNTER — Ambulatory Visit: Payer: Managed Care, Other (non HMO) | Admitting: Family Medicine

## 2021-03-05 ENCOUNTER — Encounter: Payer: Self-pay | Admitting: Family Medicine

## 2021-03-05 DIAGNOSIS — F39 Unspecified mood [affective] disorder: Secondary | ICD-10-CM

## 2021-03-05 DIAGNOSIS — F411 Generalized anxiety disorder: Secondary | ICD-10-CM

## 2021-03-05 MED ORDER — DULOXETINE HCL 60 MG PO CPEP: 60.0000 mg | ORAL_CAPSULE | Freq: Every day | ORAL | 0 refills | Status: DC

## 2021-05-29 ENCOUNTER — Other Ambulatory Visit: Payer: Self-pay | Admitting: Family Medicine

## 2021-05-29 DIAGNOSIS — F411 Generalized anxiety disorder: Secondary | ICD-10-CM

## 2021-05-29 DIAGNOSIS — F39 Unspecified mood [affective] disorder: Secondary | ICD-10-CM

## 2021-06-07 ENCOUNTER — Encounter: Payer: Self-pay | Admitting: Family Medicine

## 2021-06-07 ENCOUNTER — Other Ambulatory Visit: Payer: Self-pay

## 2021-06-07 ENCOUNTER — Ambulatory Visit: Payer: Managed Care, Other (non HMO) | Admitting: Family Medicine

## 2021-06-07 VITALS — BP 118/80 | HR 80 | Temp 98.6°F | Ht 68.0 in | Wt 184.8 lb

## 2021-06-07 DIAGNOSIS — D500 Iron deficiency anemia secondary to blood loss (chronic): Secondary | ICD-10-CM | POA: Diagnosis not present

## 2021-06-07 DIAGNOSIS — K911 Postgastric surgery syndromes: Secondary | ICD-10-CM

## 2021-06-07 DIAGNOSIS — E663 Overweight: Secondary | ICD-10-CM

## 2021-06-07 DIAGNOSIS — F39 Unspecified mood [affective] disorder: Secondary | ICD-10-CM

## 2021-06-07 DIAGNOSIS — F3342 Major depressive disorder, recurrent, in full remission: Secondary | ICD-10-CM | POA: Diagnosis not present

## 2021-06-07 DIAGNOSIS — Z13220 Encounter for screening for lipoid disorders: Secondary | ICD-10-CM

## 2021-06-07 DIAGNOSIS — R7401 Elevation of levels of liver transaminase levels: Secondary | ICD-10-CM

## 2021-06-07 DIAGNOSIS — F411 Generalized anxiety disorder: Secondary | ICD-10-CM

## 2021-06-07 DIAGNOSIS — F330 Major depressive disorder, recurrent, mild: Secondary | ICD-10-CM

## 2021-06-07 MED ORDER — DULOXETINE HCL 60 MG PO CPEP: ORAL_CAPSULE | ORAL | 3 refills | Status: DC

## 2021-06-07 MED ORDER — BLOOD GLUCOSE MONITOR KIT: PACK | 0 refills | Status: DC

## 2021-06-07 NOTE — Assessment & Plan Note (Signed)
Asymptomatic.  She will continue Cymbalta 60 mg daily.

## 2021-06-07 NOTE — Assessment & Plan Note (Signed)
Her symptoms seem consistent with postprandial hyperinsulinemic hypoglycemia though the timeframe seems more consistent with early dumping syndrome.  Discussed she needs to check her sugar at home and a glucometer was sent in.  Discussed that she needs to avoid high carbohydrate/sugary foods.

## 2021-06-07 NOTE — Assessment & Plan Note (Signed)
Well-controlled.  She will continue Cymbalta 60 mg daily.  Refill provided.

## 2021-06-07 NOTE — Progress Notes (Signed)
Tommi Rumps, MD Phone: [phone]  Mary Fox is a 36 y.o. female who presents today for f/u.  Depression: Patient notes this is very well controlled on the Cymbalta.  She missed a dose and notes that day was not very good but otherwise generally this is controlled.  No anxiety.  She does have some stress with a promotion at work though generally is doing well.  Deficiency anemia: She continues to take her iron.  She notes its been a year and a half since she had an iron infusion.  Jitteriness: Patient notes it at times feels as though her sugar goes low.  It happens if she eats larger volumes of sugary foods.  She will start sweating and not feel well.  She will subsequently take glucose tablets and feel better.  She is status post gastric surgery.  She notes her symptoms occur 15 to 30 minutes after eating.  Social History   Tobacco Use  Smoking Status Former   Types: Cigarettes   Quit date: 11/05/2010   Years since quitting: 10.5  Smokeless Tobacco Never    Current Outpatient Medications on File Prior to Visit  Medication Sig Dispense Refill   Biotin 10000 MCG TABS Take 10,000 mcg by mouth daily.     Cholecalciferol (VITAMIN D) 50 MCG (2000 UT) tablet Take 2,000 Units by mouth daily.     drospirenone-ethinyl estradiol (YAZ) 3-0.02 MG tablet TAKE 1 TABLET BY MOUTH DAILY 84 tablet 3   drospirenone-ethinyl estradiol (YAZ) 3-0.02 MG tablet Take 1 tablet by mouth daily. 84 tablet 3   ferrous sulfate 325 (65 FE) MG tablet Take 325 mg by mouth 2 (two) times daily with a meal.      ibuprofen (ADVIL) 200 MG tablet Take 400-800 mg by mouth every 6 (six) hours as needed for headache or moderate pain.     LORazepam (ATIVAN) 1 MG tablet Take 1 mg by mouth 2 (two) times daily.     No current facility-administered medications on file prior to visit.     ROS see history of present illness  Objective  Physical Exam Vitals:   06/07/21 1138  BP: 118/80  Pulse: 80  Temp: 98.6 F (37  C)  SpO2: 99%    BP Readings from Last 3 Encounters:  06/07/21 118/80  02/24/21 120/80  11/16/20 124/80   Wt Readings from Last 3 Encounters:  06/07/21 184 lb 12.8 oz (83.8 kg)  02/24/21 182 lb (82.6 kg)  11/16/20 192 lb (87.1 kg)    Physical Exam Constitutional:      General: She is not in acute distress.    Appearance: She is not diaphoretic.  Cardiovascular:     Rate and Rhythm: Normal rate and regular rhythm.     Heart sounds: Normal heart sounds.  Pulmonary:     Effort: Pulmonary effort is normal.     Breath sounds: Normal breath sounds.  Skin:    General: Skin is warm and dry.  Neurological:     Mental Status: She is alert.     Assessment/Plan: Please see individual problem list.  Problem List Items Addressed This Visit     GAD (generalized anxiety disorder)    Well-controlled.  She will continue Cymbalta 60 mg daily.  Refill provided.      Relevant Medications   DULoxetine (CYMBALTA) 60 MG capsule   IDA (iron deficiency anemia)    Recheck iron labs and CBC.      Relevant Orders   Iron, TIBC and Ferritin  Panel   CBC   MDD (major depressive disorder), recurrent episode, mild (HCC)    Asymptomatic.  She will continue Cymbalta 60 mg daily.      Relevant Medications   DULoxetine (CYMBALTA) 60 MG capsule   MDD (major depressive disorder), recurrent, in full remission ([place])   Relevant Medications   DULoxetine (CYMBALTA) 60 MG capsule   Postsurgical dumping syndrome - Primary    Her symptoms seem consistent with postprandial hyperinsulinemic hypoglycemia though the timeframe seems more consistent with early dumping syndrome.  Discussed she needs to check her sugar at home and a glucometer was sent in.  Discussed that she needs to avoid high carbohydrate/sugary foods.      Relevant Medications   blood glucose meter kit and supplies KIT   Other Visit Diagnoses     Overweight       Relevant Orders   HgB A1c   Lipid screening       Relevant Orders    Lipid panel   Comp Met (CMET)   Episodic mood disorder (HCC)       Relevant Medications   DULoxetine (CYMBALTA) 60 MG capsule        Health Maintenance: The patient will check with her work regarding her tetanus vaccine.  She reports having had HIV and hepatitis C screening previously.  Return in about 1 year (around 06/07/2022) for Anxiety.  This visit occurred during the SARS-CoV-2 public health emergency.  Safety protocols were in place, including screening questions prior to the visit, additional usage of staff PPE, and extensive cleaning of exam room while observing appropriate contact time as indicated for disinfecting solutions.    Tommi Rumps, MD [place]

## 2021-06-07 NOTE — Patient Instructions (Signed)
Nice to see you. I have refilled your Cymbalta. Please try to check your sugar the next several times you feel poorly after eating any excessive carbohydrates. We will contact you with your lab results.

## 2021-06-07 NOTE — Assessment & Plan Note (Signed)
Recheck iron labs and CBC.

## 2021-06-08 LAB — HEMOGLOBIN A1C
Est. average glucose Bld gHb Est-mCnc: 103 mg/dL
Hgb A1c MFr Bld: 5.2 % (ref 4.8–5.6)

## 2021-06-08 LAB — LIPID PANEL
Chol/HDL Ratio: 2.2 ratio (ref 0.0–4.4)
Cholesterol, Total: 188 mg/dL (ref 100–199)
HDL: 85 mg/dL (ref >39)
LDL Chol Calc (NIH): 85 mg/dL (ref 0–99)
Triglycerides: 105 mg/dL (ref 0–149)
VLDL Cholesterol Cal: 18 mg/dL (ref 5–40)

## 2021-06-08 LAB — CBC
Hematocrit: 39.9 % (ref 34.0–46.6)
Hemoglobin: 13 g/dL (ref 11.1–15.9)
MCH: 27.7 pg (ref 26.6–33.0)
MCHC: 32.6 g/dL (ref 31.5–35.7)
MCV: 85 fL (ref 79–97)
Platelets: 251 10*3/uL (ref 150–450)
RBC: 4.69 x10E6/uL (ref 3.77–5.28)
RDW: 12.8 % (ref 11.7–15.4)
WBC: 5.3 10*3/uL (ref 3.4–10.8)

## 2021-06-08 LAB — COMPREHENSIVE METABOLIC PANEL
ALT: 41 IU/L — ABNORMAL HIGH (ref 0–32)
AST: 17 IU/L (ref 0–40)
Albumin/Globulin Ratio: 2 (ref 1.2–2.2)
Albumin: 4.1 g/dL (ref 3.8–4.8)
Alkaline Phosphatase: 88 IU/L (ref 44–121)
BUN/Creatinine Ratio: 14 (ref 9–23)
BUN: 9 mg/dL (ref 6–20)
Bilirubin Total: <0.2 mg/dL (ref 0.0–1.2)
CO2: 23 mmol/L (ref 20–29)
Calcium: 8.7 mg/dL (ref 8.7–10.2)
Chloride: 102 mmol/L (ref 96–106)
Creatinine, Ser: 0.64 mg/dL (ref 0.57–1.00)
Globulin, Total: 2.1 g/dL (ref 1.5–4.5)
Glucose: 58 mg/dL — ABNORMAL LOW (ref 70–99)
Potassium: 4.7 mmol/L (ref 3.5–5.2)
Sodium: 140 mmol/L (ref 134–144)
Total Protein: 6.2 g/dL (ref 6.0–8.5)
eGFR: 117 mL/min/{1.73_m2} (ref >59)

## 2021-06-08 LAB — IRON,TIBC AND FERRITIN PANEL
Ferritin: 37 ng/mL (ref 15–150)
Iron Saturation: 22 % (ref 15–55)
Iron: 92 ug/dL (ref 27–159)
Total Iron Binding Capacity: 417 ug/dL (ref 250–450)
UIBC: 325 ug/dL (ref 131–425)

## 2021-06-15 NOTE — Addendum Note (Signed)
Addended by: Charlyne Mom D on: 06/15/2021 02:08 PM   Modules accepted: Orders

## 2021-06-16 ENCOUNTER — Telehealth: Payer: Self-pay

## 2021-06-16 NOTE — Telephone Encounter (Signed)
Lvm for pt to return call to see if she agrees to have an [place] of her RUQ to evaluate the source of her elevated (ALT) liver enzyme.  ?

## 2021-06-19 ENCOUNTER — Other Ambulatory Visit: Payer: Self-pay | Admitting: Family Medicine

## 2021-06-19 DIAGNOSIS — R109 Unspecified abdominal pain: Secondary | ICD-10-CM

## 2021-06-19 DIAGNOSIS — R7989 Other specified abnormal findings of blood chemistry: Secondary | ICD-10-CM

## 2021-06-28 ENCOUNTER — Ambulatory Visit
Admission: RE | Admit: 2021-06-28 | Discharge: 2021-06-28 | Disposition: A | Discharge status: Home / Self Care | Payer: Managed Care, Other (non HMO) | Source: Ambulatory Visit | Attending: Family Medicine | Admitting: Family Medicine

## 2021-06-28 ENCOUNTER — Other Ambulatory Visit: Payer: Self-pay

## 2021-06-28 DIAGNOSIS — R7989 Other specified abnormal findings of blood chemistry: Secondary | ICD-10-CM | POA: Insufficient documentation

## 2021-06-28 DIAGNOSIS — R109 Unspecified abdominal pain: Secondary | ICD-10-CM | POA: Diagnosis present

## 2021-07-13 ENCOUNTER — Other Ambulatory Visit: Payer: Managed Care, Other (non HMO)

## 2021-08-11 ENCOUNTER — Telehealth: Payer: Self-pay | Admitting: Obstetrics and Gynecology

## 2021-08-11 ENCOUNTER — Encounter: Payer: Self-pay | Admitting: Obstetrics and Gynecology

## 2021-08-11 DIAGNOSIS — N939 Abnormal uterine and vaginal bleeding, unspecified: Secondary | ICD-10-CM

## 2021-08-11 DIAGNOSIS — N921 Excessive and frequent menstruation with irregular cycle: Secondary | ICD-10-CM

## 2021-08-11 NOTE — Telephone Encounter (Signed)
Called and left voicemail for patient to call back to be scheduled. 

## 2021-08-11 NOTE — Telephone Encounter (Signed)
Patient is scheduled for 08/26/21 at 3:00 pm. ABC to call with results 

## 2021-08-11 NOTE — Telephone Encounter (Signed)
Called pt to schedule [place] at [facility] on May 11.  11:00 and 3:00 spots are available as of 4/26.  Left message for pt to call back. ? ?

## 2021-08-11 NOTE — Telephone Encounter (Signed)
Patient is scheduled for 08/26/21 at 3:00 pm. ABC to call with results

## 2021-08-26 ENCOUNTER — Other Ambulatory Visit: Payer: Self-pay | Admitting: Obstetrics and Gynecology

## 2021-08-26 ENCOUNTER — Encounter: Payer: Self-pay | Admitting: Oncology

## 2021-08-26 ENCOUNTER — Telehealth: Payer: Self-pay | Admitting: Obstetrics and Gynecology

## 2021-08-26 ENCOUNTER — Ambulatory Visit (INDEPENDENT_AMBULATORY_CARE_PROVIDER_SITE_OTHER): Payer: Managed Care, Other (non HMO)

## 2021-08-26 DIAGNOSIS — N939 Abnormal uterine and vaginal bleeding, unspecified: Secondary | ICD-10-CM

## 2021-08-26 DIAGNOSIS — N921 Excessive and frequent menstruation with irregular cycle: Secondary | ICD-10-CM

## 2021-08-26 MED ORDER — MICROGESTIN 24 FE 1-20 MG-MCG PO TABS: 1.0000 | ORAL_TABLET | Freq: Every day | ORAL | 0 refills | Status: DC

## 2021-08-26 NOTE — Telephone Encounter (Addendum)
Pt aware of neg GYN u/s results for BTB with OCPs. EM=4.4 mm. Will change OCPs to lomedia, Rx eRxd for 3 months. F/u with sx at that time. Pt with ovar cysts bilat on u/s, not having pain/sx. Small enough no further eval recommended. F/u prn  ? ?Meds ordered this encounter  ?Medications  ? Norethindrone Acetate-Ethinyl Estrad-FE (MICROGESTIN 24 FE) 1-20 MG-MCG(24) tablet  ?  Sig: Take 1 tablet by mouth daily.  ?  Dispense:  84 tablet  ?  Refill:  0  ?  Order Specific Question:   Supervising Provider  ?  Answer:   CONSTANT, PEGGY [[id number]]  ? ? ?

## 2021-09-09 ENCOUNTER — Encounter: Payer: Self-pay | Admitting: Internal Medicine

## 2021-09-09 ENCOUNTER — Telehealth (INDEPENDENT_AMBULATORY_CARE_PROVIDER_SITE_OTHER): Payer: Managed Care, Other (non HMO) | Admitting: Internal Medicine

## 2021-09-09 VITALS — Temp 101.0°F

## 2021-09-09 DIAGNOSIS — U071 COVID-19: Secondary | ICD-10-CM | POA: Diagnosis not present

## 2021-09-09 DIAGNOSIS — J40 Bronchitis, not specified as acute or chronic: Secondary | ICD-10-CM

## 2021-09-09 DIAGNOSIS — R053 Chronic cough: Secondary | ICD-10-CM

## 2021-09-09 MED ORDER — HYDROCOD POLI-CHLORPHE POLI ER 10-8 MG/5ML PO SUER: 5.0000 mL | Freq: Every evening | ORAL | 0 refills | Status: DC | PRN

## 2021-09-09 MED ORDER — AZITHROMYCIN 250 MG PO TABS: ORAL_TABLET | ORAL | 0 refills | Status: AC

## 2021-09-09 MED ORDER — MOLNUPIRAVIR EUA 200MG CAPSULE: 4.0000 | ORAL_CAPSULE | Freq: Two times a day (BID) | ORAL | 0 refills | Status: AC

## 2021-09-09 MED ORDER — ALBUTEROL SULFATE HFA 108 (90 BASE) MCG/ACT IN AERS: 1.0000 | INHALATION_SPRAY | Freq: Four times a day (QID) | RESPIRATORY_TRACT | 0 refills | Status: DC | PRN

## 2021-09-09 NOTE — Progress Notes (Signed)
Telephone Note  I connected with Mary Fox   on 09/09/21 at  2:50 PM EDT by telephone and verified that I am speaking with the correct person using two identifiers.  Location patient: [place] Location provider:work or home office Persons participating in the virtual visit: patient, provider  I discussed the limitations and requested verbal permission for telemedicine visit. The patient expressed understanding and agreed to proceed.   HPI: Acute telemedicine visit for : Tested positive with home test, Headache,Cough,Fever 101,Sinus loss of taste and smell. Was not around anyone sick, covid vx x2. Interested in antiviral med. Taking nyquil dayquil, tylenol,ibuprofen,tessalon pearls w/o relief. Fever today 101 with cough, h/a tried tylenol, ibuprofen, nyquil, tessalon perles  2/2 covid shots  Going around at work at [employer]   -Pertinent past medical history: see below -Pertinent medication allergies:No Known Allergies -COVID-19 vaccine status:  Immunization History  Administered Date(s) Administered   Influenza-Unspecified 01/30/2018   Moderna Sars-Covid-2 Vaccination 06/29/2019, 07/27/2019     ROS: See pertinent positives and negatives per HPI.  Past Medical History:  Diagnosis Date   Allergic reaction 05/31/2017   Anxiety    COVID-19    09/09/21   Depression    Hiatal hernia 05/31/2019   IDA (iron deficiency anemia) 02/01/2019   IDA (iron deficiency anemia) 02/01/2019    Past Surgical History:  Procedure Laterality Date   CESAREAN SECTION  09/05/2016   CHOLECYSTECTOMY  07 31 2012   GASTRIC BYPASS       Current Outpatient Medications:    albuterol (VENTOLIN HFA) 108 (90 Base) MCG/ACT inhaler, Inhale 1-2 puffs into the lungs every 6 (six) hours as needed for wheezing or shortness of breath., Disp: 18 g, Rfl: 0   azithromycin (ZITHROMAX) 250 MG tablet, With food Take 2 tablets on day 1, then 1 tablet daily on days 2 through 5, Disp: 6 tablet, Rfl: 0   Biotin 10000 MCG  TABS, Take 10,000 mcg by mouth daily., Disp: , Rfl:    blood glucose meter kit and supplies KIT, Dispense based on patient and insurance preference. Check prn for low glucose symptoms. Dx Code: K91.1, Disp: 1 each, Rfl: 0   chlorpheniramine-HYDROcodone (TUSSIONEX PENNKINETIC ER) 10-8 MG/5ML, Take 5 mLs by mouth at bedtime as needed., Disp: 115 mL, Rfl: 0   Cholecalciferol (VITAMIN D) 50 MCG (2000 UT) tablet, Take 2,000 Units by mouth daily., Disp: , Rfl:    DULoxetine (CYMBALTA) 60 MG capsule, TAKE 1 CAPSULE(60 MG) BY MOUTH DAILY, Disp: 90 capsule, Rfl: 3   ferrous sulfate 325 (65 FE) MG tablet, Take 325 mg by mouth 2 (two) times daily with a meal. , Disp: , Rfl:    ibuprofen (ADVIL) 200 MG tablet, Take 400-800 mg by mouth every 6 (six) hours as needed for headache or moderate pain., Disp: , Rfl:    molnupiravir EUA (LAGEVRIO) 200 mg CAPS capsule, Take 4 capsules (800 mg total) by mouth 2 (two) times daily for 5 days., Disp: 40 capsule, Rfl: 0   Norethindrone Acetate-Ethinyl Estrad-FE (MICROGESTIN 24 FE) 1-20 MG-MCG(24) tablet, Take 1 tablet by mouth daily., Disp: 84 tablet, Rfl: 0   LORazepam (ATIVAN) 1 MG tablet, Take 1 mg by mouth 2 (two) times daily. (Patient not taking: Reported on 09/09/2021), Disp: , Rfl:   EXAM:  VITALS per patient if applicable:  GENERAL: alert, oriented, appears well and in no acute distress   LUNGS: on inspection no signs of respiratory distress, breathing rate appears normal, no obvious gross SOB, gasping or wheezing +  cough   CV: no obvious cyanosis  PSYCH/NEURO: pleasant and cooperative, no obvious depression or anxiety, speech and thought processing grossly intact  ASSESSMENT AND PLAN:  Discussed the following assessment and plan:  COVID-19 - Plan: molnupiravir EUA (LAGEVRIO) 200 mg CAPS capsule  Persistent cough - Plan: azithromycin (ZITHROMAX) 250 MG tablet, chlorpheniramine-HYDROcodone (TUSSIONEX PENNKINETIC ER) 10-8 MG/5ML  Bronchitis due to COVID-19  virus - Plan: albuterol (VENTOLIN HFA) 108 (90 Base) MCG/ACT inhaler  Supportive care   -we discussed possible serious and likely etiologies, options for evaluation and workup, limitations of telemedicine visit vs in person visit, treatment, treatment risks and precautions. Pt is agreeable to treatment via telemedicine at this moment.   I discussed the assessment and treatment plan with the patient. The patient was provided an opportunity to ask questions and all were answered. The patient agreed with the plan and demonstrated an understanding of the instructions.    Time spent 20 minutes   Delorise Jackson, MD

## 2021-09-09 NOTE — Patient Instructions (Signed)

## 2021-10-01 ENCOUNTER — Other Ambulatory Visit: Payer: Self-pay | Admitting: Internal Medicine

## 2021-10-01 DIAGNOSIS — J40 Bronchitis, not specified as acute or chronic: Secondary | ICD-10-CM

## 2021-11-08 ENCOUNTER — Telehealth: Payer: Self-pay | Admitting: Family Medicine

## 2021-11-08 NOTE — Telephone Encounter (Signed)
Pt called stating she would like a TOC from [place] to [place]

## 2021-11-08 NOTE — Telephone Encounter (Signed)
This is fine with me   

## 2021-11-21 ENCOUNTER — Other Ambulatory Visit: Payer: Self-pay | Admitting: Obstetrics and Gynecology

## 2021-12-03 NOTE — Telephone Encounter (Signed)
noted 

## 2021-12-03 NOTE — Telephone Encounter (Signed)
That's fine

## 2021-12-03 NOTE — Telephone Encounter (Signed)
LVM for TOC to [facility]

## 2021-12-03 NOTE — Telephone Encounter (Signed)
Sending second request will the provider except this patient 

## 2022-01-24 ENCOUNTER — Ambulatory Visit: Payer: Managed Care, Other (non HMO) | Admitting: Family Medicine

## 2022-01-24 ENCOUNTER — Encounter: Payer: Self-pay | Admitting: Family Medicine

## 2022-01-24 DIAGNOSIS — L299 Pruritus, unspecified: Secondary | ICD-10-CM | POA: Diagnosis not present

## 2022-01-24 MED ORDER — METHYLPREDNISOLONE ACETATE 40 MG/ML IJ SUSP
40.0000 mg | Freq: Once | INTRAMUSCULAR | Status: AC
  Administered 2022-01-24: 40 mg via INTRAMUSCULAR

## 2022-01-24 MED ORDER — PREDNISONE 10 MG PO TABS: ORAL_TABLET | ORAL | 0 refills | Status: AC

## 2022-01-24 NOTE — Assessment & Plan Note (Signed)
Patient with itching and rash in places over the last 2 weeks.  This could represent an atopic reaction to poison ivy or poison oak though could be some other allergic reaction.  We are going to give her 40 mg of Depo-Medrol IM.  She will start on a prednisone taper tomorrow.  We will check lab work to ensure there is no other underlying cause.  Discussed risk of sleep issues, increased appetite, and agitation with the steroids.

## 2022-01-24 NOTE — Progress Notes (Signed)
Mary Rumps, MD Phone: [phone]  Mary Fox is a 36 y.o. female who presents today for same-day visit.  Itching: Patient notes 2 weeks ago she was helping put out mulching and subsequently developed some itching.  She had rash on her right forearm she wondered if it was a bug bite.  She has developed some scattered papular rash on her right leg and dorsum of her left foot.  She notes she has been itching all over even in places where there is no rash.  She does not think she got into poison ivy.  She notes no bedbugs at home.  Her animals do not have any insects on them.  She has not noted any soap changes or detergent changes.  She has tried topical over-the-counter hydrocortisone and over-the-counter antihistamines with no benefit.  Social History   Tobacco Use  Smoking Status Former   Types: Cigarettes   Quit date: 11/05/2010   Years since quitting: 11.2  Smokeless Tobacco Never    Current Outpatient Medications on File Prior to Visit  Medication Sig Dispense Refill   albuterol (VENTOLIN HFA) 108 (90 Base) MCG/ACT inhaler INHALE 1 TO 2 PUFFS INTO THE LUNGS EVERY 6 HOURS AS NEEDED FOR WHEEZING OR SHORTNESS OF BREATH 18 g 0   Biotin 10000 MCG TABS Take 10,000 mcg by mouth daily.     blood glucose meter kit and supplies KIT Dispense based on patient and insurance preference. Check prn for low glucose symptoms. Dx Code: K91.1 1 each 0   chlorpheniramine-HYDROcodone (TUSSIONEX PENNKINETIC ER) 10-8 MG/5ML Take 5 mLs by mouth at bedtime as needed. 115 mL 0   Cholecalciferol (VITAMIN D) 50 MCG (2000 UT) tablet Take 2,000 Units by mouth daily.     DULoxetine (CYMBALTA) 60 MG capsule TAKE 1 CAPSULE(60 MG) BY MOUTH DAILY 90 capsule 3   ferrous sulfate 325 (65 FE) MG tablet Take 325 mg by mouth 2 (two) times daily with a meal.      ibuprofen (ADVIL) 200 MG tablet Take 400-800 mg by mouth every 6 (six) hours as needed for headache or moderate pain.     LORazepam (ATIVAN) 1 MG tablet Take 1  mg by mouth 2 (two) times daily.     Norethindrone Acetate-Ethinyl Estrad-FE (MICROGESTIN 24 FE) 1-20 MG-MCG(24) tablet Take 1 tablet by mouth daily. 84 tablet 0   No current facility-administered medications on file prior to visit.     ROS see history of present illness  Objective  Physical Exam Vitals:   01/24/22 1521  BP: 118/78  Pulse: 75  Temp: 99 F (37.2 C)  SpO2: 98%    BP Readings from Last 3 Encounters:  01/24/22 118/78  06/07/21 118/80  02/24/21 120/80   Wt Readings from Last 3 Encounters:  01/24/22 193 lb 9.6 oz (87.8 kg)  06/07/21 184 lb 12.8 oz (83.8 kg)  02/24/21 182 lb (82.6 kg)    Physical Exam Skin:    Comments: Excoriated papular rash on her volar right mid forearm, a cluster of papules on the dorsum of her left foot, excoriated lesions on her right lower leg      Assessment/Plan: Please see individual problem list.  Problem List Items Addressed This Visit     Itching    Patient with itching and rash in places over the last 2 weeks.  This could represent an atopic reaction to poison ivy or poison oak though could be some other allergic reaction.  We are going to give her 40 mg  of Depo-Medrol IM.  She will start on a prednisone taper tomorrow.  We will check lab work to ensure there is no other underlying cause.  Discussed risk of sleep issues, increased appetite, and agitation with the steroids.      Relevant Medications   predniSONE (DELTASONE) 10 MG tablet   Other Relevant Orders   CBC w/Diff   Hepatic function panel    Return if symptoms worsen or fail to improve.   Mary Rumps, MD [place]

## 2022-01-24 NOTE — Patient Instructions (Signed)
We will contact you with your lab results. Please try the prednisone taper.  If you have any of the side effects we discussed please let us know.

## 2022-01-25 ENCOUNTER — Encounter: Payer: Self-pay | Admitting: Family Medicine

## 2022-01-25 DIAGNOSIS — R7989 Other specified abnormal findings of blood chemistry: Secondary | ICD-10-CM

## 2022-01-25 LAB — CBC WITH DIFFERENTIAL/PLATELET
Basophils Absolute: 0.1 10*3/uL (ref 0.0–0.2)
Basos: 1 %
EOS (ABSOLUTE): 0.1 10*3/uL (ref 0.0–0.4)
Eos: 2 %
Hematocrit: 37.7 % (ref 34.0–46.6)
Hemoglobin: 13.1 g/dL (ref 11.1–15.9)
Immature Grans (Abs): 0 10*3/uL (ref 0.0–0.1)
Immature Granulocytes: 0 %
Lymphocytes Absolute: 1.8 10*3/uL (ref 0.7–3.1)
Lymphs: 32 %
MCH: 28.8 pg (ref 26.6–33.0)
MCHC: 34.7 g/dL (ref 31.5–35.7)
MCV: 83 fL (ref 79–97)
Monocytes Absolute: 0.3 10*3/uL (ref 0.1–0.9)
Monocytes: 6 %
Neutrophils Absolute: 3.3 10*3/uL (ref 1.4–7.0)
Neutrophils: 59 %
Platelets: 263 10*3/uL (ref 150–450)
RBC: 4.55 x10E6/uL (ref 3.77–5.28)
RDW: 12.1 % (ref 11.7–15.4)
WBC: 5.6 10*3/uL (ref 3.4–10.8)

## 2022-01-25 LAB — HEPATIC FUNCTION PANEL
ALT: 40 IU/L — ABNORMAL HIGH (ref 0–32)
AST: 21 IU/L (ref 0–40)
Albumin: 4 g/dL (ref 3.9–4.9)
Alkaline Phosphatase: 101 IU/L (ref 44–121)
Bilirubin Total: <0.2 mg/dL (ref 0.0–1.2)
Bilirubin, Direct: <0.1 mg/dL (ref 0.00–0.40)
Total Protein: 6.3 g/dL (ref 6.0–8.5)

## 2022-01-26 ENCOUNTER — Other Ambulatory Visit: Payer: Self-pay | Admitting: Obstetrics and Gynecology

## 2022-01-26 DIAGNOSIS — Z3041 Encounter for surveillance of contraceptive pills: Secondary | ICD-10-CM

## 2022-03-16 ENCOUNTER — Encounter: Payer: Self-pay | Admitting: Family Medicine

## 2022-03-16 ENCOUNTER — Ambulatory Visit: Payer: Managed Care, Other (non HMO) | Admitting: Family Medicine

## 2022-03-16 VITALS — BP 115/78 | HR 71 | Temp 98.6°F | Ht 68.0 in | Wt 197.2 lb

## 2022-03-16 DIAGNOSIS — E669 Obesity, unspecified: Secondary | ICD-10-CM | POA: Diagnosis not present

## 2022-03-16 MED ORDER — SEMAGLUTIDE-WEIGHT MANAGEMENT 2.4 MG/0.75ML ~~LOC~~ SOAJ: 2.4000 mg | SUBCUTANEOUS | 0 refills | Status: DC

## 2022-03-16 MED ORDER — SEMAGLUTIDE-WEIGHT MANAGEMENT 1 MG/0.5ML ~~LOC~~ SOAJ: 1.0000 mg | SUBCUTANEOUS | 0 refills | Status: DC

## 2022-03-16 MED ORDER — SEMAGLUTIDE-WEIGHT MANAGEMENT 0.5 MG/0.5ML ~~LOC~~ SOAJ: 0.5000 mg | SUBCUTANEOUS | 0 refills | Status: DC

## 2022-03-16 MED ORDER — SEMAGLUTIDE-WEIGHT MANAGEMENT 1.7 MG/0.75ML ~~LOC~~ SOAJ
1.7000 mg | SUBCUTANEOUS | 0 refills | Status: DC
Start: 2022-06-11 — End: 2022-03-30

## 2022-03-16 MED ORDER — SEMAGLUTIDE-WEIGHT MANAGEMENT 0.25 MG/0.5ML ~~LOC~~ SOAJ: 0.2500 mg | SUBCUTANEOUS | 0 refills | Status: DC

## 2022-03-16 NOTE — Progress Notes (Signed)
Mary Rumps, MD Phone: [phone]  Mary Fox is a 36 y.o. female who presents today for f/u.  Obesity: Patient notes her weight has continued to trend up despite exercising and monitoring her diet.  She has been eating less carbohydrates and using portion control.  She rides bikes or walks 5 days a week.  She is been working on trying to lose weight for the last 2 months though her weight has continued to trend up.  She notes no personal or family history of thyroid cancer, parathyroid cancer, or adrenal gland cancer.  No personal history of pancreatitis.  She does not have a gallbladder.  Social History   Tobacco Use  Smoking Status Former   Types: Cigarettes   Quit date: 11/05/2010   Years since quitting: 11.3  Smokeless Tobacco Never    Current Outpatient Medications on File Prior to Visit  Medication Sig Dispense Refill   albuterol (VENTOLIN HFA) 108 (90 Base) MCG/ACT inhaler INHALE 1 TO 2 PUFFS INTO THE LUNGS EVERY 6 HOURS AS NEEDED FOR WHEEZING OR SHORTNESS OF BREATH 18 g 0   Biotin 10000 MCG TABS Take 10,000 mcg by mouth daily.     blood glucose meter kit and supplies KIT Dispense based on patient and insurance preference. Check prn for low glucose symptoms. Dx Code: K91.1 1 each 0   chlorpheniramine-HYDROcodone (TUSSIONEX PENNKINETIC ER) 10-8 MG/5ML Take 5 mLs by mouth at bedtime as needed. 115 mL 0   Cholecalciferol (VITAMIN D) 50 MCG (2000 UT) tablet Take 2,000 Units by mouth daily.     DULoxetine (CYMBALTA) 60 MG capsule TAKE 1 CAPSULE(60 MG) BY MOUTH DAILY 90 capsule 3   ferrous sulfate 325 (65 FE) MG tablet Take 325 mg by mouth 2 (two) times daily with a meal.      ibuprofen (ADVIL) 200 MG tablet Take 400-800 mg by mouth every 6 (six) hours as needed for headache or moderate pain.     LORazepam (ATIVAN) 1 MG tablet Take 1 mg by mouth 2 (two) times daily.     Norethindrone Acetate-Ethinyl Estrad-FE (MICROGESTIN 24 FE) 1-20 MG-MCG(24) tablet Take 1 tablet by mouth  daily. 84 tablet 0   No current facility-administered medications on file prior to visit.     ROS see history of present illness  Objective  Physical Exam Vitals:   03/16/22 1210  BP: 115/78  Pulse: 71  Temp: 98.6 F (37 C)  SpO2: 99%    BP Readings from Last 3 Encounters:  03/16/22 115/78  01/24/22 118/78  06/07/21 118/80   Wt Readings from Last 3 Encounters:  03/16/22 197 lb 3.2 oz (89.4 kg)  01/24/22 193 lb 9.6 oz (87.8 kg)  06/07/21 184 lb 12.8 oz (83.8 kg)    Physical Exam Constitutional:      General: She is not in acute distress. Pulmonary:     Effort: Pulmonary effort is normal.  Neurological:     Mental Status: She is alert.      Assessment/Plan: Please see individual problem list.  Problem List Items Addressed This Visit     Obesity (BMI 30.0-34.9) - Primary (Chronic)    Previously overweight with increasing weight.  BMI of 30.  Discussed she does meet criteria for weight loss medication.  We will start the patient on Wegovy.  They were counseled on the risk of pancreatitis and gallbladder disease.  Discussed the risk of nausea.  They were advised to discontinue the [facility] and contact us immediately if they develop abdominal pain.  If they develop excessive nausea they will contact us right away.  I discussed that medullary thyroid cancer has been seen in rats studies.  The patient confirmed no personal or family history of thyroid cancer, parathyroid cancer, or adrenal gland cancer.  Discussed that we thus far have not seen medullary thyroid cancer result from use of this type of medication in humans.  They were advised to monitor the thyroid area and contact us for any lumps, swelling, trouble swallowing, or any other changes in this area.   She will let us know if it is not covered by her insurance or if her pharmacy is not able to get this.      Relevant Medications   Semaglutide-Weight Management 0.25 MG/0.5ML SOAJ   Semaglutide-Weight Management  0.5 MG/0.5ML SOAJ (Start on 04/14/2022)   Semaglutide-Weight Management 1 MG/0.5ML SOAJ (Start on 05/13/2022)   Semaglutide-Weight Management 1.7 MG/0.75ML SOAJ (Start on 06/11/2022)   Semaglutide-Weight Management 2.4 MG/0.75ML SOAJ (Start on 07/10/2022)    Return in about 2 months (around 05/16/2022) for weight.   Mary Rumps, MD [place]

## 2022-03-16 NOTE — Assessment & Plan Note (Addendum)
Previously overweight with increasing weight.  BMI of 30.  Discussed she does meet criteria for weight loss medication.  We will start the patient on Wegovy.  They were counseled on the risk of pancreatitis and gallbladder disease.  Discussed the risk of nausea.  They were advised to discontinue the [facility] and contact us immediately if they develop abdominal pain.  If they develop excessive nausea they will contact us right away.  I discussed that medullary thyroid cancer has been seen in rats studies.  The patient confirmed no personal or family history of thyroid cancer, parathyroid cancer, or adrenal gland cancer.  Discussed that we thus far have not seen medullary thyroid cancer result from use of this type of medication in humans.  They were advised to monitor the thyroid area and contact us for any lumps, swelling, trouble swallowing, or any other changes in this area.   She will let us know if it is not covered by her insurance or if her pharmacy is not able to get this.

## 2022-03-17 ENCOUNTER — Encounter: Payer: Self-pay | Admitting: Family Medicine

## 2022-03-25 ENCOUNTER — Telehealth: Payer: Self-pay

## 2022-03-25 ENCOUNTER — Encounter: Payer: Self-pay | Admitting: Family Medicine

## 2022-03-25 NOTE — Telephone Encounter (Signed)
PA for [facility] has been sent to pts plan today awaiting determination:   Mary Fox   (Key: [id number]) Rx #: [id number] NUUVOZ 2.4MG /0.75ML auto-injectors   Form [facility] Electronic Prior Authorization Form (2017 NCPDP) Created 9 days ago Sent to Plan 23 minutes ago Plan Response 22 minutes ago Submit Clinical Questions less than a minute ago Determination Wait for Determination Please wait for [employer] 2017 NCPDP to return a determination.    Pt has been attached to the PA and ill receive msgs about approval or denial and pt has been notified.

## 2022-03-27 NOTE — Telephone Encounter (Signed)
Request Reference Number: [id number]. WEGOVY INJ 1MG  is approved through 10/24/2022. Your patient may now fill this prescription and it will be covered.

## 2022-03-30 MED ORDER — SAXENDA 18 MG/3ML ~~LOC~~ SOPN: PEN_INJECTOR | SUBCUTANEOUS | 0 refills | Status: DC

## 2022-03-31 ENCOUNTER — Other Ambulatory Visit: Payer: Self-pay

## 2022-03-31 MED ORDER — SAXENDA 18 MG/3ML ~~LOC~~ SOPN
PEN_INJECTOR | SUBCUTANEOUS | 0 refills | Status: DC
  Filled 2022-03-31: qty 59, 146d supply, fill #0
  Filled 2022-04-04 – 2022-04-05 (×2): qty 15, 30d supply, fill #0
  Filled 2022-04-05: qty 15, 58d supply, fill #0
  Filled 2022-05-16: qty 15, 30d supply, fill #1

## 2022-03-31 NOTE — Addendum Note (Signed)
Addended by: Donavan Foil on: 03/31/2022 04:04 PM   Modules accepted: Orders

## 2022-04-04 ENCOUNTER — Telehealth: Payer: Self-pay

## 2022-04-04 ENCOUNTER — Other Ambulatory Visit: Payer: Self-pay

## 2022-04-04 ENCOUNTER — Telehealth: Payer: Self-pay | Admitting: Family Medicine

## 2022-04-04 ENCOUNTER — Other Ambulatory Visit (HOSPITAL_COMMUNITY): Payer: Self-pay

## 2022-04-04 ENCOUNTER — Encounter: Payer: Self-pay | Admitting: Oncology

## 2022-04-04 NOTE — Telephone Encounter (Signed)
Arlys John from [facility] [facility] called wanting to see if the pt is taking this medication-saxenda

## 2022-04-04 NOTE — Telephone Encounter (Signed)
Pharmacy Patient Advocate Encounter   Received notification from [facility] that prior authorization for Saxenda 18MG /3ML pen-injectors is required/requested.  PA submitted on 04/04/2022 to [employer] via CoverMyMeds Key [id number]  Status is pending

## 2022-04-05 ENCOUNTER — Other Ambulatory Visit: Payer: Self-pay

## 2022-04-05 ENCOUNTER — Other Ambulatory Visit (HOSPITAL_COMMUNITY): Payer: Self-pay

## 2022-04-05 MED ORDER — UNIFINE PENTIPS 32G X 4 MM MISC
0 refills | Status: DC
  Filled 2022-04-05: qty 100, 90d supply, fill #0

## 2022-04-05 NOTE — Telephone Encounter (Addendum)
Pharmacy Patient Advocate Encounter  Received notification from [facility] that the request for prior authorization for Saxenda 18MG /3ML pen-injectors has been denied due to not meeting requirements.     No additional information given. Called insurance to see why it was denied. Advised that it was denied because there was no indication that patient will not be taking with another GLP-1 agonist. Insurance started a new PA and resubmitting.   Denial letter attached  New Key: 

## 2022-04-05 NOTE — Telephone Encounter (Signed)
Patient was approved today for the saxenda and I called the pharmacy and they stated the medication is out of stock and would not be in until the end of January.  Taneesha Edgin,cma

## 2022-04-05 NOTE — Telephone Encounter (Signed)
Patient Advocate Encounter  Prior Authorization for Saxenda 18MG /3ML pen-injectors has been approved.    PA# Key: [id number]  Effective dates: 04/05/22 through 10/05/2022

## 2022-04-13 ENCOUNTER — Telehealth: Payer: Self-pay

## 2022-04-13 NOTE — Telephone Encounter (Signed)
Wegovy  mg was approved from [facility] from 03/25/2022-10/24/2022.  ng

## 2022-04-18 ENCOUNTER — Other Ambulatory Visit: Payer: Self-pay | Admitting: Obstetrics and Gynecology

## 2022-05-16 ENCOUNTER — Encounter: Payer: Self-pay | Admitting: Family Medicine

## 2022-05-16 ENCOUNTER — Other Ambulatory Visit: Payer: Self-pay | Admitting: Family Medicine

## 2022-05-16 ENCOUNTER — Other Ambulatory Visit: Payer: Self-pay

## 2022-05-16 ENCOUNTER — Ambulatory Visit: Payer: Managed Care, Other (non HMO) | Admitting: Family Medicine

## 2022-05-16 VITALS — BP 122/78 | HR 80 | Temp 97.8°F | Resp 16 | Ht 68.0 in | Wt 183.4 lb

## 2022-05-16 DIAGNOSIS — E669 Obesity, unspecified: Secondary | ICD-10-CM | POA: Diagnosis not present

## 2022-05-16 MED ORDER — DROSPIRENONE-ETHINYL ESTRADIOL 3-0.02 MG PO TABS
1.0000 | ORAL_TABLET | Freq: Every day | ORAL | 1 refills | Status: DC
  Filled 2022-05-16: qty 28, 28d supply, fill #0
  Filled 2022-08-18: qty 28, 28d supply, fill #1

## 2022-05-16 NOTE — Progress Notes (Signed)
Tommi Rumps, MD Phone: [phone]  Mary Fox is a 37 y.o. female who presents today for f/u.  Obesity: patient has been focusing on vegetables and proteins for her diet. She still occasionally eats some bread but she has limited this. She walks daily for at least 30 minutes. Currently she is taking liraglutide 1.8mg  and will move up to 2.4mg  in two days. She denies any nausea, vomiting, constipation, diarrhea, or abdominal pain on this medication. She has lost 12 pounds in a month so far and she is pleased with her weight loss thus far.   Social History   Tobacco Use  Smoking Status Former   Types: Cigarettes   Quit date: 11/05/2010   Years since quitting: 11.5  Smokeless Tobacco Never    Current Outpatient Medications on File Prior to Visit  Medication Sig Dispense Refill   albuterol (VENTOLIN HFA) 108 (90 Base) MCG/ACT inhaler INHALE 1 TO 2 PUFFS INTO THE LUNGS EVERY 6 HOURS AS NEEDED FOR WHEEZING OR SHORTNESS OF BREATH 18 g 0   Biotin 10000 MCG TABS Take 10,000 mcg by mouth daily.     blood glucose meter kit and supplies KIT Dispense based on patient and insurance preference. Check prn for low glucose symptoms. Dx Code: K91.1 1 each 0   chlorpheniramine-HYDROcodone (TUSSIONEX PENNKINETIC ER) 10-8 MG/5ML Take 5 mLs by mouth at bedtime as needed. 115 mL 0   Cholecalciferol (VITAMIN D) 50 MCG (2000 UT) tablet Take 2,000 Units by mouth daily.     DULoxetine (CYMBALTA) 60 MG capsule TAKE 1 CAPSULE(60 MG) BY MOUTH DAILY 90 capsule 3   ferrous sulfate 325 (65 FE) MG tablet Take 325 mg by mouth 2 (two) times daily with a meal.      ibuprofen (ADVIL) 200 MG tablet Take 400-800 mg by mouth every 6 (six) hours as needed for headache or moderate pain.     Insulin Pen Needle (UNIFINE PENTIPS) 32G X 4 MM MISC Use as directed with Saxenda 100 each 0   Liraglutide -Weight Management (SAXENDA) 18 MG/3ML SOPN Inject 0.6 mg into the skin daily for 14 days, THEN 1.2 mg daily for 14 days, THEN 1.8  mg daily for 14 days, THEN 2.4 mg daily for 14 days, THEN 3 mg daily. 59 mL 0   LORazepam (ATIVAN) 1 MG tablet Take 1 mg by mouth 2 (two) times daily.     Norethindrone Acetate-Ethinyl Estrad-FE (MICROGESTIN 24 FE) 1-20 MG-MCG(24) tablet Take 1 tablet by mouth daily. 84 tablet 0   No current facility-administered medications on file prior to visit.     ROS see history of present illness  Objective  Physical Exam Vitals:   05/16/22 1413  BP: 122/78  Pulse: 80  Resp: 16  Temp: 97.8 F (36.6 C)  SpO2: 99%    BP Readings from Last 3 Encounters:  05/16/22 122/78  03/16/22 115/78  01/24/22 118/78   Wt Readings from Last 3 Encounters:  05/16/22 183 lb 6 oz (83.2 kg)  03/16/22 197 lb 3.2 oz (89.4 kg)  01/24/22 193 lb 9.6 oz (87.8 kg)    Physical Exam Constitutional:      Appearance: Normal appearance.  HENT:     Head: Normocephalic and atraumatic.  Cardiovascular:     Rate and Rhythm: Normal rate and regular rhythm.  Pulmonary:     Effort: Pulmonary effort is normal.     Breath sounds: Normal breath sounds.  Skin:    General: Skin is warm and dry.  Neurological:  Mental Status: She is alert.      Assessment/Plan: Please see individual problem list.  Problem List Items Addressed This Visit       Other   Obesity (BMI 30.0-34.9) - Primary (Chronic)    Patient has seen good result on Saxenda. We will continue the Saxenda weekly and patient will move to 2.4mg  in two days as scheduled, and then go up to 3mg  as the max dose. Patient will inform us if she develops any side effects on the increased medication dose. Will follow up with patient in 3 months.        Return in about 3 months (around 08/15/2022) for weight.   Marisa Cyphers, Medical Student [place]

## 2022-05-16 NOTE — Assessment & Plan Note (Addendum)
Patient has seen good result on Saxenda. We will continue the Saxenda weekly and patient will move to 2.4mg  in two days as scheduled, and then go up to 3mg  as the max dose. Patient will inform us if she develops any side effects on the increased medication dose. Will follow up with patient in 3 months.

## 2022-05-16 NOTE — Progress Notes (Signed)
Patient seen along with medical student Zada Zhong.  I personally evaluated this patient along with the student, and verified all aspects of the history, physical exam, and medical decision making as documented by the student.  I agree with the student's documentation and have made all necessary edits.  Morna Flud, MD  

## 2022-05-17 ENCOUNTER — Other Ambulatory Visit: Payer: Self-pay

## 2022-05-17 MED ORDER — SAXENDA 18 MG/3ML ~~LOC~~ SOPN
3.0000 mg | PEN_INJECTOR | Freq: Every day | SUBCUTANEOUS | 3 refills | Status: DC
  Filled 2022-05-17 – 2022-05-19 (×2): qty 15, 30d supply, fill #0
  Filled 2022-06-16: qty 15, 30d supply, fill #1
  Filled 2022-08-18: qty 15, 30d supply, fill #2
  Filled 2022-09-14 – 2022-09-21 (×2): qty 15, 30d supply, fill #3

## 2022-05-17 MED ORDER — SAXENDA 18 MG/3ML ~~LOC~~ SOPN: 3.0000 mg | PEN_INJECTOR | Freq: Every day | SUBCUTANEOUS | 3 refills | Status: DC

## 2022-05-19 ENCOUNTER — Encounter: Payer: Self-pay | Admitting: Oncology

## 2022-05-19 ENCOUNTER — Other Ambulatory Visit: Payer: Self-pay

## 2022-05-20 ENCOUNTER — Other Ambulatory Visit: Payer: Self-pay

## 2022-05-24 ENCOUNTER — Other Ambulatory Visit: Payer: Self-pay

## 2022-06-16 ENCOUNTER — Other Ambulatory Visit: Payer: Self-pay

## 2022-06-20 ENCOUNTER — Other Ambulatory Visit: Payer: Self-pay

## 2022-08-17 ENCOUNTER — Ambulatory Visit: Payer: Managed Care, Other (non HMO) | Admitting: Family Medicine

## 2022-08-18 ENCOUNTER — Other Ambulatory Visit: Payer: Self-pay

## 2022-08-21 ENCOUNTER — Other Ambulatory Visit: Payer: Self-pay | Admitting: Family Medicine

## 2022-08-21 DIAGNOSIS — F411 Generalized anxiety disorder: Secondary | ICD-10-CM

## 2022-08-21 DIAGNOSIS — F39 Unspecified mood [affective] disorder: Secondary | ICD-10-CM

## 2022-09-07 ENCOUNTER — Telehealth: Payer: Self-pay

## 2022-09-07 NOTE — Telephone Encounter (Signed)
*  Primary  PA request received via CMM for Saxenda 18MG /3ML pen-injectors  PA submitted to [facility] and is pending additional questions/determination  Key: [id number]  *patient has reduced calories and continues to exercise   *patient lost 12lbs in 1 month

## 2022-09-14 ENCOUNTER — Other Ambulatory Visit: Payer: Self-pay | Admitting: Family Medicine

## 2022-09-15 ENCOUNTER — Encounter: Payer: Self-pay | Admitting: Oncology

## 2022-09-15 ENCOUNTER — Other Ambulatory Visit: Payer: Self-pay

## 2022-09-15 MED ORDER — DROSPIRENONE-ETHINYL ESTRADIOL 3-0.02 MG PO TABS
1.0000 | ORAL_TABLET | Freq: Every day | ORAL | 1 refills | Status: DC
  Filled 2022-09-15: qty 28, 28d supply, fill #0
  Filled 2022-12-07 – 2022-12-27 (×2): qty 28, 28d supply, fill #1

## 2022-09-16 NOTE — Telephone Encounter (Signed)
PA has been APPROVED from 09/07/2022-03/10/2023

## 2022-09-19 ENCOUNTER — Other Ambulatory Visit: Payer: Self-pay

## 2022-09-21 ENCOUNTER — Other Ambulatory Visit: Payer: Self-pay

## 2022-10-25 ENCOUNTER — Other Ambulatory Visit: Payer: Self-pay | Admitting: Family Medicine

## 2022-10-25 ENCOUNTER — Other Ambulatory Visit: Payer: Self-pay

## 2022-10-25 MED ORDER — SAXENDA 18 MG/3ML ~~LOC~~ SOPN
3.0000 mg | PEN_INJECTOR | Freq: Every day | SUBCUTANEOUS | 0 refills | Status: DC
  Filled 2022-10-25: qty 15, 30d supply, fill #0

## 2022-10-31 ENCOUNTER — Other Ambulatory Visit: Payer: Self-pay

## 2022-11-29 ENCOUNTER — Other Ambulatory Visit: Payer: Self-pay | Admitting: Family Medicine

## 2022-11-29 DIAGNOSIS — F411 Generalized anxiety disorder: Secondary | ICD-10-CM

## 2022-11-29 DIAGNOSIS — F39 Unspecified mood [affective] disorder: Secondary | ICD-10-CM

## 2022-12-07 ENCOUNTER — Other Ambulatory Visit: Payer: Self-pay | Admitting: Family Medicine

## 2022-12-07 ENCOUNTER — Other Ambulatory Visit: Payer: Self-pay

## 2022-12-07 MED ORDER — SAXENDA 18 MG/3ML ~~LOC~~ SOPN
3.0000 mg | PEN_INJECTOR | Freq: Every day | SUBCUTANEOUS | 0 refills | Status: DC
  Filled 2022-12-07 – 2022-12-27 (×2): qty 15, 30d supply, fill #0

## 2022-12-20 DIAGNOSIS — Z1231 Encounter for screening mammogram for malignant neoplasm of breast: Secondary | ICD-10-CM

## 2022-12-23 ENCOUNTER — Other Ambulatory Visit: Payer: Self-pay

## 2022-12-27 ENCOUNTER — Other Ambulatory Visit: Payer: Self-pay

## 2022-12-28 ENCOUNTER — Ambulatory Visit: Payer: Managed Care, Other (non HMO) | Admitting: Family Medicine

## 2022-12-28 ENCOUNTER — Encounter: Payer: Self-pay | Admitting: Family Medicine

## 2022-12-28 VITALS — BP 118/74 | HR 78 | Temp 98.3°F | Ht 68.0 in | Wt 174.4 lb

## 2022-12-28 DIAGNOSIS — Z23 Encounter for immunization: Secondary | ICD-10-CM

## 2022-12-28 DIAGNOSIS — D500 Iron deficiency anemia secondary to blood loss (chronic): Secondary | ICD-10-CM

## 2022-12-28 DIAGNOSIS — J020 Streptococcal pharyngitis: Secondary | ICD-10-CM | POA: Diagnosis not present

## 2022-12-28 DIAGNOSIS — J029 Acute pharyngitis, unspecified: Secondary | ICD-10-CM | POA: Diagnosis not present

## 2022-12-28 DIAGNOSIS — E669 Obesity, unspecified: Secondary | ICD-10-CM

## 2022-12-28 DIAGNOSIS — Z1329 Encounter for screening for other suspected endocrine disorder: Secondary | ICD-10-CM

## 2022-12-28 DIAGNOSIS — Z0001 Encounter for general adult medical examination with abnormal findings: Secondary | ICD-10-CM | POA: Diagnosis not present

## 2022-12-28 DIAGNOSIS — Z0000 Encounter for general adult medical examination without abnormal findings: Secondary | ICD-10-CM | POA: Insufficient documentation

## 2022-12-28 DIAGNOSIS — Z13220 Encounter for screening for lipoid disorders: Secondary | ICD-10-CM

## 2022-12-28 LAB — POCT RAPID STREP A (OFFICE): Rapid Strep A Screen: POSITIVE — AB

## 2022-12-28 MED ORDER — PENICILLIN V POTASSIUM 500 MG PO TABS: 500.0000 mg | ORAL_TABLET | Freq: Three times a day (TID) | ORAL | 0 refills | Status: AC

## 2022-12-28 NOTE — Assessment & Plan Note (Addendum)
Physical exam completed.  Encouraged reduction in soda intake.  Encouraged continued other healthy diet and exercise.  Patient will follow-up with gynecology for Pap smear.  Tetanus vaccine given today.  Patient Clines flu vaccine and further COVID vaccinations.  Lab work as outlined.

## 2022-12-28 NOTE — Progress Notes (Signed)
Marikay Alar, MD Phone: [phone]  Mary Fox is a 37 y.o. female who presents today for CPE.  Diet: Portion control, generally eats healthy, is on Saxenda, has 3-5 diet Cokes a day Exercise: Walks 3-4 times a week for 20 to 30 minutes at a time Pap smear: Up-to-date, completed through GYN Family history-  Colon cancer: no  Breast cancer: maternal aunt  Ovarian cancer: no Menses: Monthly for 3 to 4 days, no intermenstrual bleeding Vaccines-   Flu: Declines  Tetanus: due  COVID19: x2 HIV screening: UTD Hep C Screening: UTD Tobacco use: no Alcohol use: no Illicit Drug use: no Dentist: yes Ophthalmology: yes  Sore throat: Patient notes onset of symptoms about 4 days ago.  Started to have spots in the back of her throat today.  No cough, congestion, or fevers.   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   Itching 01/24/2022   Obesity (BMI 30.0-34.9) 03/16/2022   Encounter for general adult medical examination with abnormal findings 12/28/2022   Strep throat 12/28/2022   Resolved Ambulatory Problems    Diagnosis Date Noted   Hyponatremia 11/19/2014   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   Depression 11/24/2014   Morbid obesity (HCC) 12/01/2016   Allergic reaction 05/31/2017   Depression 12/07/2017   Abnormal findings on diagnostic imaging of lung 03/07/2019   Heartburn 03/07/2019   Low back strain 04/13/2017   Postop check 05/27/2019   Hiatal hernia 05/31/2019   Epigastric abdominal pain  04/30/2019   BMI 35.0-35.9,adult 04/30/2019   Pain in joint of left shoulder 06/03/2019   PMDD (premenstrual dysphoric disorder) 12/24/2019   Episodic mood disorder (HCC) 01/23/2020   History of COVID-19 05/18/2020   Noncompliance with treatment plan 06/18/2020   Attention and concentration deficit 06/18/2020   Past Medical History:  Diagnosis Date   Anxiety    COVID-19     Family History  Problem Relation Age of Onset   Alcohol abuse Mother    Anxiety disorder Mother    Depression Mother    Alcohol abuse Sister    Anxiety disorder Sister    Depression Sister    Diabetes Neg Hx     Social History   Socioeconomic History   Marital status: Married    Spouse name: benjamin   Number of children: 2   Years of education: Not on file   Highest education level: Master's degree (e.g., MA, MS, MEng, MEd, MSW, MBA)  Occupational History   Not on file  Tobacco Use   Smoking status: Former    Current packs/day: 0.00    Types: Cigarettes    Quit date: 11/05/2010    Years since quitting: 12.1   Smokeless tobacco: Never  Vaping Use   Vaping status: Never Used  Substance and Sexual Activity   Alcohol use: Not Currently    Alcohol/week: 2.0 standard drinks of alcohol    Types: 2 Glasses of wine per week   Drug use: No   Sexual activity: Yes    Birth control/protection: Pill  Other Topics Concern   Not on file  Social  History Narrative   Not on file   Social Determinants of Health   Financial Resource Strain: Low Risk  (03/07/2019)   Overall Financial Resource Strain (CARDIA)    Difficulty of Paying Living Expenses: Not hard at all  Food Insecurity: No Food Insecurity (03/07/2019)   Hunger Vital Sign    Worried About Running Out of Food in the Last Year: Never true    Ran Out of Food in the Last Year: Never true  Transportation Needs: No Transportation Needs (03/07/2019)   PRAPARE - Administrator, Civil Service (Medical): No    Lack of Transportation  (Non-Medical): No  Physical Activity: Inactive (03/07/2019)   Exercise Vital Sign    Days of Exercise per Week: 0 days    Minutes of Exercise per Session: 0 min  Stress: Stress Concern Present (03/07/2019)   [employer] of Occupational Health - Occupational Stress Questionnaire    Feeling of Stress : Rather much  Social Connections: Unknown (03/07/2019)   Social Connection and Isolation Panel [NHANES]    Frequency of Communication with Friends and Family: Not on file    Frequency of Social Gatherings with Friends and Family: Not on file    Attends Religious Services: Never    Active Member of Clubs or Organizations: No    Attends Banker Meetings: Never    Marital Status: Married  Catering manager Violence: Not At Risk (03/07/2019)   Humiliation, Afraid, Rape, and Kick questionnaire    Fear of Current or Ex-Partner: No    Emotionally Abused: No    Physically Abused: No    Sexually Abused: No    ROS  General:  Negative for nexplained weight loss, fever Skin: Negative for new or changing mole, sore that won't heal HEENT: Positive for sore throat, dysphagia, negative for trouble hearing, trouble seeing, ringing in ears, mouth sores, hoarseness, change in voice. CV:  Negative for chest pain, dyspnea, edema, palpitations Resp: Negative for cough, dyspnea, hemoptysis GI: Negative for nausea, vomiting, diarrhea, constipation, abdominal pain, melena, hematochezia. GU: Negative for dysuria, incontinence, urinary hesitance, hematuria, vaginal or penile discharge, polyuria, sexual difficulty, lumps in testicle or breasts MSK: Negative for muscle cramps or aches, joint pain or swelling Neuro: Negative for headaches, weakness, numbness, dizziness, passing out/fainting Psych: Negative for depression, anxiety, memory problems  Objective  Physical Exam Vitals:   12/28/22 0947  BP: 118/74  Pulse: 78  Temp: 98.3 F (36.8 C)  SpO2: 99%    BP Readings from Last 3  Encounters:  12/28/22 118/74  05/16/22 122/78  03/16/22 115/78   Wt Readings from Last 3 Encounters:  12/28/22 174 lb 6.4 oz (79.1 kg)  05/16/22 183 lb 6 oz (83.2 kg)  03/16/22 197 lb 3.2 oz (89.4 kg)    Physical Exam Constitutional:      General: She is not in acute distress.    Appearance: She is not diaphoretic.  HENT:     Head: Normocephalic and atraumatic.     Mouth/Throat:     Mouth: Mucous membranes are moist.     Pharynx: Posterior oropharyngeal erythema present. No oropharyngeal exudate.  Neck:     Comments: No cervical adenopathy the does have tenderness over the anterior cervical chain bilaterally Cardiovascular:     Rate and Rhythm: Normal rate and regular rhythm.     Heart sounds: Normal heart sounds.  Pulmonary:     Effort: Pulmonary effort is normal.     Breath sounds: Normal breath sounds.  Skin:    General: Skin is warm and dry.  Neurological:     Mental Status: She is alert.      Assessment/Plan:   Encounter for general adult medical examination with abnormal findings Assessment & Plan: Physical exam completed.  Encouraged reduction in soda intake.  Encouraged continued other healthy diet and exercise.  Patient will follow-up with gynecology for Pap smear.  Tetanus vaccine given today.  Patient Clines flu vaccine and further COVID vaccinations.  Lab work as outlined.   Sore throat -     POCT rapid strep A  Iron deficiency anemia due to chronic blood loss -     CBC -     Iron, TIBC and Ferritin Panel  Obesity (BMI 30.0-34.9) -     Hemoglobin A1c  Lipid screening -     Comprehensive metabolic panel -     Lipid panel  Thyroid disorder screen -     TSH  Strep throat Assessment & Plan: Patient with positive strep test and sore throat.  Will treat with penicillin VK 500 mg 3 times daily for 10 days.  Advise she is infectious until she has had 24 hours of antibiotics.  Work note provided.  Discussed completing the entire course of antibiotics  even if her symptoms resolve sooner than 10 days.  Orders: -     Penicillin V Potassium; Take 1 tablet (500 mg total) by mouth 3 (three) times daily for 10 days.  Dispense: 30 tablet; Refill: 0  Encounter for administration of vaccine -     Td vaccine greater than or equal to 7yo preservative free IM    Return in about 6 months (around 06/27/2023) for Weight follow-up.   Marikay Alar, MD [facility]

## 2022-12-28 NOTE — Patient Instructions (Signed)
Nice to see you.  You have strep throat. You are infectious until you've had 24 hours of antibiotics in your system.  You should remain at home until you have completed 24 hours of antibiotics.  Please make sure you complete the entire course of antibiotics even if your symptoms improve. Will contact you with your lab results.

## 2022-12-28 NOTE — Assessment & Plan Note (Signed)
Patient with positive strep test and sore throat.  Will treat with penicillin VK 500 mg 3 times daily for 10 days.  Advise she is infectious until she has had 24 hours of antibiotics.  Work note provided.  Discussed completing the entire course of antibiotics even if her symptoms resolve sooner than 10 days.

## 2022-12-29 LAB — CBC
Hematocrit: 36 % (ref 34.0–46.6)
Hemoglobin: 11.3 g/dL (ref 11.1–15.9)
MCH: 25.8 pg — ABNORMAL LOW (ref 26.6–33.0)
MCHC: 31.4 g/dL — ABNORMAL LOW (ref 31.5–35.7)
MCV: 82 fL (ref 79–97)
Platelets: 244 10*3/uL (ref 150–450)
RBC: 4.38 x10E6/uL (ref 3.77–5.28)
RDW: 13.6 % (ref 11.7–15.4)
WBC: 4.7 10*3/uL (ref 3.4–10.8)

## 2022-12-29 LAB — COMPREHENSIVE METABOLIC PANEL
ALT: 28 IU/L (ref 0–32)
AST: 21 IU/L (ref 0–40)
Albumin: 3.9 g/dL (ref 3.9–4.9)
Alkaline Phosphatase: 88 IU/L (ref 44–121)
BUN/Creatinine Ratio: 13 (ref 9–23)
BUN: 8 mg/dL (ref 6–20)
Bilirubin Total: <0.2 mg/dL (ref 0.0–1.2)
CO2: 23 mmol/L (ref 20–29)
Calcium: 9 mg/dL (ref 8.7–10.2)
Chloride: 106 mmol/L (ref 96–106)
Creatinine, Ser: 0.64 mg/dL (ref 0.57–1.00)
Globulin, Total: 2.1 g/dL (ref 1.5–4.5)
Glucose: 63 mg/dL — ABNORMAL LOW (ref 70–99)
Potassium: 4.2 mmol/L (ref 3.5–5.2)
Sodium: 141 mmol/L (ref 134–144)
Total Protein: 6 g/dL (ref 6.0–8.5)
eGFR: 115 mL/min/{1.73_m2} (ref >59)

## 2022-12-29 LAB — IRON,TIBC AND FERRITIN PANEL
Ferritin: 5 ng/mL — ABNORMAL LOW (ref 15–150)
Iron Saturation: 10 % — ABNORMAL LOW (ref 15–55)
Iron: 46 ug/dL (ref 27–159)
Total Iron Binding Capacity: 477 ug/dL — ABNORMAL HIGH (ref 250–450)
UIBC: 431 ug/dL — ABNORMAL HIGH (ref 131–425)

## 2022-12-29 LAB — LIPID PANEL
Chol/HDL Ratio: 1.9 ratio (ref 0.0–4.4)
Cholesterol, Total: 161 mg/dL (ref 100–199)
HDL: 84 mg/dL (ref >39)
LDL Chol Calc (NIH): 66 mg/dL (ref 0–99)
Triglycerides: 52 mg/dL (ref 0–149)
VLDL Cholesterol Cal: 11 mg/dL (ref 5–40)

## 2022-12-29 LAB — TSH: TSH: 1.39 u[IU]/mL (ref 0.450–4.500)

## 2022-12-29 LAB — HEMOGLOBIN A1C
Est. average glucose Bld gHb Est-mCnc: 114 mg/dL
Hgb A1c MFr Bld: 5.6 % (ref 4.8–5.6)

## 2022-12-30 ENCOUNTER — Telehealth: Payer: Self-pay

## 2022-12-30 DIAGNOSIS — Z87448 Personal history of other diseases of urinary system: Secondary | ICD-10-CM

## 2022-12-30 DIAGNOSIS — E611 Iron deficiency: Secondary | ICD-10-CM

## 2022-12-30 NOTE — Telephone Encounter (Signed)
-----   Message from Marikay Alar sent at 12/30/2022 10:37 AM EDT ----- Please let the patient know that her iron levels are quite low.  Is she taking an iron supplement?  Given how low her iron is she needs to see GI to have a GI workup to make sure she is not bleeding from her GI tract.  We should also check her urine to make sure there is no blood in her urine.  I can place orders for both of those things when she speak with her.  Her other labs are acceptable.

## 2022-12-30 NOTE — Telephone Encounter (Signed)
Left message to call the office back regarding the lab results below.

## 2023-01-02 NOTE — Telephone Encounter (Signed)
Patient states she is returning our call.  I read Dr. Bernardo Heater message to patient.  Patient states she is not currently taking an iron supplement.  Patient states she has not been able to stomach the over the counter ones she has tried in the past.  Patient states she would like to know what she will be having done with GI.  Patient states she is fine with Dr. Purvis Sheffield recommendations.  I have scheduled an appointment for patient's lab (urine) on 01/03/2023.

## 2023-01-03 ENCOUNTER — Other Ambulatory Visit: Payer: Self-pay

## 2023-01-03 ENCOUNTER — Other Ambulatory Visit: Payer: Managed Care, Other (non HMO)

## 2023-01-03 DIAGNOSIS — Z87448 Personal history of other diseases of urinary system: Secondary | ICD-10-CM

## 2023-01-03 NOTE — Telephone Encounter (Signed)
Left message to call back regarding Dr. Purvis Sheffield message below.

## 2023-01-03 NOTE — Telephone Encounter (Signed)
If she has not been able to tolerate the over-the-counter iron supplement then we may need to have her see hematology to consider iron infusions.  She will likely have an upper endoscopy and colonoscopy with GI though I cannot promise exactly what they will do.  Anytime somebody has iron deficiency they need a workup to determine if there are bleeding from their GI tract.  I can place the hematology and GI referrals once you speak with her.

## 2023-01-03 NOTE — Addendum Note (Signed)
Addended by: Prince Solian A on: 01/03/2023 09:14 AM   Modules accepted: Orders

## 2023-01-03 NOTE — Telephone Encounter (Signed)
Patient states she is returning our call.  I read message from Dr. Marikay Alar to patient.  Patient states she would like to start with GI to see if she is bleeding and then go from there.  Patient states she would like to know who Dr. Birdie Sons is going to refer her to so she will know who to expect a call from.

## 2023-01-04 NOTE — Telephone Encounter (Signed)
Called  Patient to let her know that Dr. Birdie Sons referred her to [facility] .

## 2023-01-04 NOTE — Addendum Note (Signed)
Addended by: Birdie Sons, Elzada Pytel G on: 01/04/2023 11:09 AM   Modules accepted: Orders

## 2023-01-04 NOTE — Telephone Encounter (Addendum)
I am referring her to [facility].  Referral has been placed.

## 2023-01-05 LAB — URINE CULTURE

## 2023-01-17 ENCOUNTER — Other Ambulatory Visit: Payer: Self-pay | Admitting: Family Medicine

## 2023-01-18 ENCOUNTER — Other Ambulatory Visit: Payer: Self-pay

## 2023-01-18 MED ORDER — DROSPIRENONE-ETHINYL ESTRADIOL 3-0.02 MG PO TABS
1.0000 | ORAL_TABLET | Freq: Every day | ORAL | 1 refills | Status: DC
  Filled 2023-01-18: qty 28, 28d supply, fill #0

## 2023-01-18 MED ORDER — SAXENDA 18 MG/3ML ~~LOC~~ SOPN
3.0000 mg | PEN_INJECTOR | Freq: Every day | SUBCUTANEOUS | 0 refills | Status: DC
  Filled 2023-01-18: qty 15, 30d supply, fill #0

## 2023-02-02 ENCOUNTER — Encounter: Payer: Self-pay | Admitting: Oncology

## 2023-02-08 NOTE — Progress Notes (Signed)
Celso Amy, PA-C [address]  [address]  [place], [place] [address]  Main: [phone]  Fax: [phone]   Gastroenterology Consultation  Referring Provider:     Glori Luis, MD Primary Care Physician:  Glori Luis, MD Primary Gastroenterologist:  Celso Amy, PA-C / Dr. Lannette Donath   Reason for Consultation:     Iron deficiency anemia        HPI:   Mary Fox is a 37 y.o. y/o female referred for consultation & management  by Glori Luis, MD.    She is here to evaluate iron deficiency anemia.  Previous history of gastric bypass /bariatric surgery.  She had laparoscopic hiatal hernia repair, pouch revision, small bowel resection, JJ revision, partial gastrectomy in 05/2019.  Cholecystectomy in 2012.  C-section 2018.  Labs 12/28/2022 showed hemoglobin 11.3, total iron 46, iron saturation 10%, ferritin 5.  Labs consistent with iron deficiency anemia.  Previous lab 01/2022 showed hemoglobin 13.1.  Normal folate.  She has not been taking iron supplement.  Not able to tolerate OTC iron.  She has received IV iron infusions in the past.  No family history of colon cancer.  No previous colonoscopy, EGD, or GI evaluation.  She has chronic intermittent generalized mid abdominal pain which comes and goes.  Occasional heartburn but no dysphagia.  Episode of diarrhea twice per week.  She denies constipation, melena, or hematochezia.  Her father has IBS.  Past Medical History:  Diagnosis Date   Allergic reaction 05/31/2017   Anxiety    COVID-19    09/09/21   Depression    Hiatal hernia 05/31/2019   IDA (iron deficiency anemia) 02/01/2019   IDA (iron deficiency anemia) 02/01/2019    Past Surgical History:  Procedure Laterality Date   CESAREAN SECTION  09/05/2016   CHOLECYSTECTOMY  07 31 2012   GASTRIC BYPASS      Prior to Admission medications   Medication Sig Start Date End Date Taking? Authorizing Provider  Biotin 47425 MCG TABS Take 10,000 mcg by  mouth daily.    [provider]  blood glucose meter kit and supplies KIT Dispense based on patient and insurance preference. Check prn for low glucose symptoms. Dx Code: K91.1 06/07/21   Glori Luis, MD  Cholecalciferol (VITAMIN D) 50 MCG (2000 UT) tablet Take 2,000 Units by mouth daily.    [provider]  drospirenone-ethinyl estradiol (YAZ) 3-0.02 MG tablet Take 1 tablet by mouth daily. 01/18/23   Glori Luis, MD  DULoxetine (CYMBALTA) 60 MG capsule TAKE 1 CAPSULE(60 MG) BY MOUTH DAILY 12/01/22   Glori Luis, MD  Insulin Pen Needle (UNIFINE PENTIPS) 32G X 4 MM MISC Use as directed with Saxenda 04/05/22     Liraglutide -Weight Management (SAXENDA) 18 MG/3ML SOPN Inject 3 mg into the skin daily. 01/18/23   Glori Luis, MD  LORazepam (ATIVAN) 1 MG tablet Take 1 mg by mouth 2 (two) times daily. 02/17/21   [provider]    Family History  Problem Relation Age of Onset   Alcohol abuse Mother    Anxiety disorder Mother    Depression Mother    Alcohol abuse Sister    Anxiety disorder Sister    Depression Sister    Diabetes Neg Hx      Social History   Tobacco Use   Smoking status: Former    Current packs/day: 0.00    Types: Cigarettes    Quit date: 11/05/2010  Years since quitting: 12.2   Smokeless tobacco: Never  Vaping Use   Vaping status: Never Used  Substance Use Topics   Alcohol use: Not Currently    Alcohol/week: 2.0 standard drinks of alcohol    Types: 2 Glasses of wine per week   Drug use: No    Allergies as of 02/10/2023   (No Known Allergies)    Review of Systems:    All systems reviewed and negative except where noted in HPI.   Physical Exam:  BP 118/86   Pulse 79   Temp 97.7 F (36.5 C)   Ht 5\' 8"  (1.727 m)   Wt 177 lb 9.6 oz (80.6 kg)   LMP 02/06/2023   BMI 27.00 kg/m  Patient's last menstrual period was 02/06/2023.  General:   Alert,  Well-developed, well-nourished, pleasant and cooperative in  NAD Lungs:  Respirations even and unlabored.  Clear throughout to auscultation.   No wheezes, crackles, or rhonchi. No acute distress. Heart:  Regular rate and rhythm; no murmurs, clicks, rubs, or gallops. Abdomen:  Normal bowel sounds.  No bruits.  Soft, and non-distended without masses, hepatosplenomegaly or hernias noted.  Mild generalized mid periumbilical abdominal tenderness.  Mild epigastric tenderness.  No guarding or rebound tenderness.   No lower abdominal tenderness. Neurologic:  Alert and oriented x3;  grossly normal neurologically. Psych:  Alert and cooperative. Normal mood and affect.  Imaging Studies: No results found.  Assessment and Plan:   Mary Fox is a 37 y.o. y/o female has been referred for:   1.  Iron deficiency anemia  -Labs CBC, iron panel, ferritin, celiac panel  -Referred to hematology for IV iron infusion.  -Patient cannot tolerate oral iron.  -Scheduling EGD & Colonoscopy I discussed risks of EGD and colonoscopy with patient to include risk of bleeding, perforation, and risk of sedation.  Patient expressed understanding and agrees to proceed with procedures.    2.  History of gastric bypass surgery  Lab: Vitamin D, B12, folate  3.  Abdominal pain generalized -chronic and intermittent; suspect IBS-D  If EGD and colonoscopy are unrevealing, consider cholestyramine powder, Colestid tablets, dicyclomine or hyoscyamine.  4.  Intermittent diarrhea: Ddx: IBS-D, Bile Salt Diarrhea s/p CCY, Celiac.  Lab: Celiac panel  Follow up in 3 months with TG.  Also follow-up based on test results.  Celso Amy, PA-C

## 2023-02-10 ENCOUNTER — Ambulatory Visit: Payer: Managed Care, Other (non HMO) | Admitting: Physician Assistant

## 2023-02-10 ENCOUNTER — Encounter: Payer: Self-pay | Admitting: Physician Assistant

## 2023-02-10 ENCOUNTER — Other Ambulatory Visit: Payer: Self-pay

## 2023-02-10 VITALS — BP 118/86 | HR 79 | Temp 97.7°F | Ht 68.0 in | Wt 177.6 lb

## 2023-02-10 DIAGNOSIS — R1084 Generalized abdominal pain: Secondary | ICD-10-CM

## 2023-02-10 DIAGNOSIS — Z9884 Bariatric surgery status: Secondary | ICD-10-CM | POA: Diagnosis not present

## 2023-02-10 DIAGNOSIS — R197 Diarrhea, unspecified: Secondary | ICD-10-CM

## 2023-02-10 DIAGNOSIS — D509 Iron deficiency anemia, unspecified: Secondary | ICD-10-CM | POA: Diagnosis not present

## 2023-02-10 MED ORDER — PEG 3350-KCL-NA BICARB-NACL 420 G PO SOLR
4000.0000 mL | Freq: Once | ORAL | 0 refills | Status: AC
Start: 2023-02-10 — End: 2023-02-21
  Filled 2023-02-10: qty 4000, 1d supply, fill #0

## 2023-02-15 ENCOUNTER — Telehealth: Payer: Self-pay

## 2023-02-15 DIAGNOSIS — D509 Iron deficiency anemia, unspecified: Secondary | ICD-10-CM

## 2023-02-15 LAB — CBC WITH DIFFERENTIAL/PLATELET
Basophils Absolute: 0 10*3/uL (ref 0.0–0.2)
Basos: 1 %
EOS (ABSOLUTE): 0.1 10*3/uL (ref 0.0–0.4)
Eos: 2 %
Hematocrit: 34.3 % (ref 34.0–46.6)
Hemoglobin: 10.8 g/dL — ABNORMAL LOW (ref 11.1–15.9)
Immature Grans (Abs): 0 10*3/uL (ref 0.0–0.1)
Immature Granulocytes: 1 %
Lymphocytes Absolute: 1.6 10*3/uL (ref 0.7–3.1)
Lymphs: 41 %
MCH: 25.9 pg — ABNORMAL LOW (ref 26.6–33.0)
MCHC: 31.5 g/dL (ref 31.5–35.7)
MCV: 82 fL (ref 79–97)
Monocytes Absolute: 0.3 10*3/uL (ref 0.1–0.9)
Monocytes: 7 %
Neutrophils Absolute: 1.9 10*3/uL (ref 1.4–7.0)
Neutrophils: 48 %
Platelets: 361 10*3/uL (ref 150–450)
RBC: 4.17 x10E6/uL (ref 3.77–5.28)
RDW: 12.5 % (ref 11.7–15.4)
WBC: 3.9 10*3/uL (ref 3.4–10.8)

## 2023-02-15 LAB — CELIAC DISEASE AB SCREEN W/RFX
Antigliadin Abs, IgA: 2 U (ref 0–19)
IgA/Immunoglobulin A, Serum: 124 mg/dL (ref 87–352)
Transglutaminase IgA: <2 U/mL (ref 0–3)

## 2023-02-15 LAB — B12 AND FOLATE PANEL
Folate: 10 ng/mL (ref >3.0)
Vitamin B-12: 1262 pg/mL — ABNORMAL HIGH (ref 232–1245)

## 2023-02-15 LAB — IRON,TIBC AND FERRITIN PANEL
Ferritin: 5 ng/mL — ABNORMAL LOW (ref 15–150)
Iron Saturation: 6 % — CL (ref 15–55)
Iron: 26 ug/dL — ABNORMAL LOW (ref 27–159)
Total Iron Binding Capacity: 447 ug/dL (ref 250–450)
UIBC: 421 ug/dL (ref 131–425)

## 2023-02-15 LAB — VITAMIN D 25 HYDROXY (VIT D DEFICIENCY, FRACTURES): Vit D, 25-Hydroxy: 35.7 ng/mL (ref 30.0–100.0)

## 2023-02-15 NOTE — Telephone Encounter (Signed)
Left message to return call to office. Referral to Hematology placed-someone from their office will contact you to schedule an appointment.  1. Your iron and hemoglobin are low.  This is consistent with iron deficiency anemia.  Folate is normal.  Vitamin B12 is a little high, yet not harmful.  No need to take extra vitamin B12.  I recommend referral to hematology to schedule IV iron infusion. Marland Kitchen..

## 2023-02-15 NOTE — Telephone Encounter (Signed)
Patient is scheduled with Hematology 02/16/23.

## 2023-02-15 NOTE — Telephone Encounter (Signed)
Patient called and left a voicemail turning Shelia.  I called patient back to let him know we received his message.

## 2023-02-16 ENCOUNTER — Encounter: Payer: Self-pay | Admitting: Oncology

## 2023-02-16 ENCOUNTER — Inpatient Hospital Stay: Payer: Managed Care, Other (non HMO) | Attending: Oncology | Admitting: Oncology

## 2023-02-16 ENCOUNTER — Other Ambulatory Visit: Payer: Managed Care, Other (non HMO)

## 2023-02-16 VITALS — BP 110/60 | HR 72 | Temp 96.0°F | Resp 18 | Wt 177.9 lb

## 2023-02-16 DIAGNOSIS — Z9884 Bariatric surgery status: Secondary | ICD-10-CM | POA: Diagnosis not present

## 2023-02-16 DIAGNOSIS — D509 Iron deficiency anemia, unspecified: Secondary | ICD-10-CM | POA: Diagnosis present

## 2023-02-16 DIAGNOSIS — D500 Iron deficiency anemia secondary to blood loss (chronic): Secondary | ICD-10-CM

## 2023-02-16 DIAGNOSIS — Z87891 Personal history of nicotine dependence: Secondary | ICD-10-CM | POA: Insufficient documentation

## 2023-02-16 NOTE — Assessment & Plan Note (Signed)
Recommend IV venofer periodically as maintenance.

## 2023-02-16 NOTE — Progress Notes (Signed)
Hematology/Oncology Consult Note [phone] [phone]     REFERRING PROVIDER: Celso Amy, PA-C CHIEF COMPLAINTS/REASON FOR VISIT:  Evaluation of iron deficiency anemia   ASSESSMENT & PLAN:   IDA (iron deficiency anemia) Labs are reviewed and discussed with patient. Lab Results  Component Value Date   HGB 10.8 (L) 02/13/2023   TIBC 447 02/13/2023   IRONPCTSAT 6 (LL) 02/13/2023   FERRITIN 5 (L) 02/13/2023    Recommend Venofer weekly x 4  History of gastric bypass Recommend IV venofer periodically as maintenance.   [facility] Rx provided to patient.  Follow up in 3 months.   All questions were answered. The patient knows to call the clinic with any problems, questions or concerns.  Rickard Patience, MD, PhD [facility] Hematology Oncology 02/16/2023   HISTORY OF PRESENTING ILLNESS:  Mary Fox is a  37 y.o.  female with PMH listed below was seen in consultation at the request of Celso Amy, PA-C  for evaluation of iron deficiency anemia.   Reviewed patient's recent labs  01/28/2019 labs revealed anemia with hemoglobin of 6.4, MCV 59, normal WBC, platelet count and differential. Iron panel showed ferritin of 3, iron saturation 3. Reviewed patient's previous labs ordered by primary care physician's office, anemia is chronic onset , duration is since at least 2018.  Associated signs and symptoms: Patient reports fatigue.  She also reports mild shortness of breath with exertion.  Occasionally she feels lightheaded. Denies weight loss, easy bruising, hematochezia, hemoptysis, hematuria. Context:  History of iron deficiency: Yes recently diagnosed.  Started on oral slow FE tablets.  Also takes vitamin C tablets. Rectal bleeding: Occasionally. Menstrual bleeding/ Vaginal bleeding : Heavy menstrual bleeding. Hematemesis or hemoptysis : denies Blood in urine : denies  Last endoscopy: Fatigue: Yes.  History of gastric bypass surgery  Mary Fox is a 37  y.o. female who has above history reviewed by me today presents to re-establish care for iron deficiency anemia. Last seen by me on 12/11/2022  Iron deficiency anemia due to heavy menstrual bleeding, which now has improved after she takes OCP.  She has history of gastric bypass. She did not tolerate oral iron supplementation.  Recently she feels fatigued, and she caught and want to reestablish care.  02/13/2023 Hb 10.8, ferritin 5, iron saturation 6. TIBC 447     Review of Systems  Constitutional:  Positive for fatigue. Negative for appetite change, chills and fever.  HENT:   Negative for hearing loss and voice change.   Eyes:  Negative for eye problems.  Respiratory:  Negative for chest tightness and cough.   Cardiovascular:  Negative for chest pain.  Gastrointestinal:  Negative for abdominal distention, abdominal pain and blood in stool.  Endocrine: Negative for hot flashes.  Genitourinary:  Negative for difficulty urinating and frequency.   Musculoskeletal:  Negative for arthralgias.  Skin:  Negative for itching and rash.  Neurological:  Negative for extremity weakness.  Hematological:  Negative for adenopathy.  Psychiatric/Behavioral:  Negative for confusion.     MEDICAL HISTORY:  Past Medical History:  Diagnosis Date   Allergic reaction 05/31/2017   Anxiety    COVID-19    09/09/21   Depression    Hiatal hernia 05/31/2019   IDA (iron deficiency anemia) 02/01/2019   IDA (iron deficiency anemia) 02/01/2019    SURGICAL HISTORY: Past Surgical History:  Procedure Laterality Date   CESAREAN SECTION  09/05/2016   CHOLECYSTECTOMY  07 31 2012   GASTRIC BYPASS  SOCIAL HISTORY: Social History   Socioeconomic History   Marital status: Married    Spouse name: benjamin   Number of children: 2   Years of education: Not on file   Highest education level: Master's degree (e.g., MA, MS, MEng, MEd, MSW, MBA)  Occupational History   Not on file  Tobacco Use   Smoking  status: Former    Current packs/day: 0.00    Types: Cigarettes    Quit date: 11/05/2010    Years since quitting: 12.2   Smokeless tobacco: Never  Vaping Use   Vaping status: Never Used  Substance and Sexual Activity   Alcohol use: Not Currently    Alcohol/week: 2.0 standard drinks of alcohol    Types: 2 Glasses of wine per week   Drug use: No   Sexual activity: Yes    Birth control/protection: Pill  Other Topics Concern   Not on file  Social History Narrative   Not on file   Social Determinants of Health   Financial Resource Strain: Low Risk  (02/16/2023)   Overall Financial Resource Strain (CARDIA)    Difficulty of Paying Living Expenses: Not very hard  Food Insecurity: No Food Insecurity (02/16/2023)   Hunger Vital Sign    Worried About Running Out of Food in the Last Year: Never true    Ran Out of Food in the Last Year: Never true  Transportation Needs: No Transportation Needs (02/16/2023)   PRAPARE - Administrator, Civil Service (Medical): No    Lack of Transportation (Non-Medical): No  Physical Activity: Inactive (03/07/2019)   Exercise Vital Sign    Days of Exercise per Week: 0 days    Minutes of Exercise per Session: 0 min  Stress: Stress Concern Present (03/07/2019)   [employer] of Occupational Health - Occupational Stress Questionnaire    Feeling of Stress : Rather much  Social Connections: Unknown (03/07/2019)   Social Connection and Isolation Panel [NHANES]    Frequency of Communication with Friends and Family: Not on file    Frequency of Social Gatherings with Friends and Family: Not on file    Attends Religious Services: Never    Active Member of Clubs or Organizations: No    Attends Banker Meetings: Never    Marital Status: Married  Catering manager Violence: Not At Risk (02/16/2023)   Humiliation, Afraid, Rape, and Kick questionnaire    Fear of Current or Ex-Partner: No    Emotionally Abused: No    Physically Abused:  No    Sexually Abused: No    FAMILY HISTORY: Family History  Problem Relation Age of Onset   Alcohol abuse Mother    Anxiety disorder Mother    Depression Mother    Alcohol abuse Sister    Anxiety disorder Sister    Depression Sister    Diabetes Neg Hx     ALLERGIES:  has No Known Allergies.  MEDICATIONS:  Current Outpatient Medications  Medication Sig Dispense Refill   Biotin 56433 MCG TABS Take 10,000 mcg by mouth daily.     blood glucose meter kit and supplies KIT Dispense based on patient and insurance preference. Check prn for low glucose symptoms. Dx Code: K91.1 1 each 0   DULoxetine (CYMBALTA) 60 MG capsule TAKE 1 CAPSULE(60 MG) BY MOUTH DAILY 90 capsule 1   Insulin Pen Needle (UNIFINE PENTIPS) 32G X 4 MM MISC Use as directed with Saxenda 100 each 0   Liraglutide -Weight Management (SAXENDA) 18 MG/3ML SOPN  Inject 3 mg into the skin daily. 15 mL 0   LORazepam (ATIVAN) 1 MG tablet Take 1 mg by mouth 2 (two) times daily.     No current facility-administered medications for this visit.     PHYSICAL EXAMINATION: ECOG PERFORMANCE STATUS: 0 - Asymptomatic Vitals:   02/16/23 0938  BP: 110/60  Pulse: 72  Resp: 18  Temp: (!) 96 F (35.6 C)  SpO2: 100%   Filed Weights   02/16/23 0938  Weight: 177 lb 14.4 oz (80.7 kg)    Physical Exam Constitutional:      General: She is not in acute distress. HENT:     Head: Normocephalic and atraumatic.  Eyes:     General: No scleral icterus. Cardiovascular:     Rate and Rhythm: Normal rate and regular rhythm.     Heart sounds: Normal heart sounds.  Pulmonary:     Effort: Pulmonary effort is normal. No respiratory distress.     Breath sounds: No wheezing.  Abdominal:     General: Bowel sounds are normal. There is no distension.     Palpations: Abdomen is soft.  Musculoskeletal:        General: No deformity. Normal range of motion.     Cervical back: Normal range of motion and neck supple.  Skin:    General: Skin is  warm and dry.     Findings: No rash.  Neurological:     Mental Status: She is alert and oriented to person, place, and time. Mental status is at baseline.     Cranial Nerves: No cranial nerve deficit.  Psychiatric:        Mood and Affect: Mood normal.           LABORATORY DATA:  I have reviewed the data as listed    Latest Ref Rng & Units 02/13/2023    8:49 AM 12/28/2022   10:08 AM 01/24/2022    3:30 PM  CBC  WBC 3.4 - 10.8 x10E3/uL 3.9  4.7  5.6   Hemoglobin 11.1 - 15.9 g/dL 08.6  57.8  46.9   Hematocrit 34.0 - 46.6 % 34.3  36.0  37.7   Platelets 150 - 450 x10E3/uL 361  244  263       Latest Ref Rng & Units 12/28/2022   10:08 AM 01/24/2022    3:30 PM 06/07/2021   12:02 PM  CMP  Glucose 70 - 99 mg/dL 63   58   BUN 6 - 20 mg/dL 8   9   Creatinine 6.29 - 1.00 mg/dL 5.28   4.13   Sodium 244 - 144 mmol/L 141   140   Potassium 3.5 - 5.2 mmol/L 4.2   4.7   Chloride 96 - 106 mmol/L 106   102   CO2 20 - 29 mmol/L 23   23   Calcium 8.7 - 10.2 mg/dL 9.0   8.7   Total Protein 6.0 - 8.5 g/dL 6.0  6.3  6.2   Total Bilirubin 0.0 - 1.2 mg/dL <0.1  <0.2  <7.2   Alkaline Phos 44 - 121 IU/L 88  101  88   AST 0 - 40 IU/L 21  21  17    ALT 0 - 32 IU/L 28  40  41      Iron/TIBC/Ferritin/ %Sat    Component Value Date/Time   IRON 26 (L) 02/13/2023 0849   TIBC 447 02/13/2023 0849   FERRITIN 5 (L) 02/13/2023 0849   IRONPCTSAT 6 (LL) 02/13/2023  8315    No results found.

## 2023-02-16 NOTE — Assessment & Plan Note (Signed)
Labs are reviewed and discussed with patient. Lab Results  Component Value Date   HGB 10.8 (L) 02/13/2023   TIBC 447 02/13/2023   IRONPCTSAT 6 (LL) 02/13/2023   FERRITIN 5 (L) 02/13/2023    Recommend Venofer weekly x 4

## 2023-02-19 ENCOUNTER — Encounter: Payer: Self-pay | Admitting: Family Medicine

## 2023-02-19 DIAGNOSIS — F411 Generalized anxiety disorder: Secondary | ICD-10-CM

## 2023-02-19 DIAGNOSIS — F39 Unspecified mood [affective] disorder: Secondary | ICD-10-CM

## 2023-02-20 ENCOUNTER — Other Ambulatory Visit: Payer: Self-pay

## 2023-02-20 MED ORDER — DULOXETINE HCL 60 MG PO CPEP: ORAL_CAPSULE | ORAL | 1 refills | Status: DC

## 2023-02-27 ENCOUNTER — Inpatient Hospital Stay: Payer: Managed Care, Other (non HMO) | Attending: Oncology

## 2023-02-27 VITALS — BP 112/75 | HR 73 | Temp 97.9°F | Resp 16

## 2023-02-27 DIAGNOSIS — D509 Iron deficiency anemia, unspecified: Secondary | ICD-10-CM | POA: Diagnosis present

## 2023-02-27 DIAGNOSIS — D500 Iron deficiency anemia secondary to blood loss (chronic): Secondary | ICD-10-CM

## 2023-02-27 MED ORDER — IRON SUCROSE 20 MG/ML IV SOLN
200.0000 mg | Freq: Once | INTRAVENOUS | Status: AC
  Administered 2023-02-27: 200 mg via INTRAVENOUS
  Filled 2023-02-27: qty 10

## 2023-03-02 ENCOUNTER — Telehealth: Payer: Self-pay | Admitting: Pharmacy Technician

## 2023-03-02 ENCOUNTER — Other Ambulatory Visit (HOSPITAL_COMMUNITY): Payer: Self-pay

## 2023-03-02 NOTE — Telephone Encounter (Signed)
Pharmacy Patient Advocate Encounter   Received notification from CoverMyMeds that prior authorization for Saxenda 18MG /3ML pen-injectors is required/requested.   Insurance verification completed.   The patient is insured through [facility] .   Per test claim: PA required; PA submitted to above mentioned insurance via CoverMyMeds Key/confirmation #/EOC [id number] Status is pending

## 2023-03-06 ENCOUNTER — Inpatient Hospital Stay: Payer: Managed Care, Other (non HMO)

## 2023-03-06 VITALS — BP 114/68 | HR 72 | Temp 97.2°F | Resp 18

## 2023-03-06 DIAGNOSIS — D509 Iron deficiency anemia, unspecified: Secondary | ICD-10-CM | POA: Diagnosis not present

## 2023-03-06 DIAGNOSIS — D500 Iron deficiency anemia secondary to blood loss (chronic): Secondary | ICD-10-CM

## 2023-03-06 MED ORDER — IRON SUCROSE 20 MG/ML IV SOLN
200.0000 mg | Freq: Once | INTRAVENOUS | Status: AC
  Administered 2023-03-06: 200 mg via INTRAVENOUS

## 2023-03-06 NOTE — Patient Instructions (Signed)
Iron Sucrose Injection What is this medication? IRON SUCROSE (EYE ern SOO krose) treats low levels of iron (iron deficiency anemia) in people with kidney disease. Iron is a mineral that plays an important role in making red blood cells, which carry oxygen from your lungs to the rest of your body. This medicine may be used for other purposes; ask your health care provider or pharmacist if you have questions. COMMON BRAND NAME(S): Venofer What should I tell my care team before I take this medication? They need to know if you have any of these conditions: Anemia not caused by low iron levels Heart disease High levels of iron in the blood Kidney disease Liver disease An unusual or allergic reaction to iron, other medications, foods, dyes, or preservatives Pregnant or trying to get pregnant Breastfeeding How should I use this medication? This medication is for infusion into a vein. It is given in a hospital or clinic setting. Talk to your care team about the use of this medication in children. While this medication may be prescribed for children as young as 2 years for selected conditions, precautions do apply. Overdosage: If you think you have taken too much of this medicine contact a poison control center or emergency room at once. NOTE: This medicine is only for you. Do not share this medicine with others. What if I miss a dose? Keep appointments for follow-up doses. It is important not to miss your dose. Call your care team if you are unable to keep an appointment. What may interact with this medication? Do not take this medication with any of the following: Deferoxamine Dimercaprol Other iron products This medication may also interact with the following: Chloramphenicol Deferasirox This list may not describe all possible interactions. Give your health care provider a list of all the medicines, herbs, non-prescription drugs, or dietary supplements you use. Also tell them if you smoke,  drink alcohol, or use illegal drugs. Some items may interact with your medicine. What should I watch for while using this medication? Visit your care team regularly. Tell your care team if your symptoms do not start to get better or if they get worse. You may need blood work done while you are taking this medication. You may need to follow a special diet. Talk to your care team. Foods that contain iron include: whole grains/cereals, dried fruits, beans, or peas, leafy green vegetables, and organ meats (liver, kidney). What side effects may I notice from receiving this medication? Side effects that you should report to your care team as soon as possible: Allergic reactions--skin rash, itching, hives, swelling of the face, lips, tongue, or throat Low blood pressure--dizziness, feeling faint or lightheaded, blurry vision Shortness of breath Side effects that usually do not require medical attention (report to your care team if they continue or are bothersome): Flushing Headache Joint pain Muscle pain Nausea Pain, redness, or irritation at injection site This list may not describe all possible side effects. Call your doctor for medical advice about side effects. You may report side effects to FDA at 1-800-FDA-1088. Where should I keep my medication? This medication is given in a hospital or clinic. It will not be stored at home. NOTE: This sheet is a summary. It may not cover all possible information. If you have questions about this medicine, talk to your doctor, pharmacist, or health care provider.  2024 Elsevier/Gold Standard (2022-09-09 00:00:00)

## 2023-03-09 ENCOUNTER — Ambulatory Visit: Payer: Managed Care, Other (non HMO) | Admitting: Anesthesiology

## 2023-03-09 ENCOUNTER — Encounter: Payer: Self-pay | Admitting: Gastroenterology

## 2023-03-09 ENCOUNTER — Encounter
Admission: RE | Disposition: A | Discharge status: Home / Self Care | Payer: Self-pay | Source: Home / Self Care | Attending: Gastroenterology

## 2023-03-09 ENCOUNTER — Ambulatory Visit
Admission: RE | Admit: 2023-03-09 | Discharge: 2023-03-09 | Disposition: A | Discharge status: Home / Self Care | Payer: Managed Care, Other (non HMO) | Attending: Gastroenterology | Admitting: Gastroenterology

## 2023-03-09 DIAGNOSIS — Z980 Intestinal bypass and anastomosis status: Secondary | ICD-10-CM | POA: Insufficient documentation

## 2023-03-09 DIAGNOSIS — F419 Anxiety disorder, unspecified: Secondary | ICD-10-CM | POA: Insufficient documentation

## 2023-03-09 DIAGNOSIS — D500 Iron deficiency anemia secondary to blood loss (chronic): Secondary | ICD-10-CM | POA: Diagnosis present

## 2023-03-09 DIAGNOSIS — R1310 Dysphagia, unspecified: Secondary | ICD-10-CM | POA: Insufficient documentation

## 2023-03-09 DIAGNOSIS — D509 Iron deficiency anemia, unspecified: Secondary | ICD-10-CM | POA: Diagnosis not present

## 2023-03-09 DIAGNOSIS — F32A Depression, unspecified: Secondary | ICD-10-CM | POA: Insufficient documentation

## 2023-03-09 DIAGNOSIS — Z87891 Personal history of nicotine dependence: Secondary | ICD-10-CM | POA: Insufficient documentation

## 2023-03-09 DIAGNOSIS — Z9884 Bariatric surgery status: Secondary | ICD-10-CM | POA: Diagnosis not present

## 2023-03-09 HISTORY — PX: BIOPSY: SHX5522

## 2023-03-09 HISTORY — PX: ESOPHAGOGASTRODUODENOSCOPY (EGD) WITH PROPOFOL: SHX5813

## 2023-03-09 HISTORY — PX: COLONOSCOPY WITH PROPOFOL: SHX5780

## 2023-03-09 LAB — POCT PREGNANCY, URINE: Preg Test, Ur: NEGATIVE

## 2023-03-09 SURGERY — COLONOSCOPY WITH PROPOFOL
Anesthesia: General

## 2023-03-09 MED ORDER — DEXMEDETOMIDINE HCL IN NACL 80 MCG/20ML IV SOLN
INTRAVENOUS | Status: DC | PRN
  Administered 2023-03-09: 8 ug via INTRAVENOUS

## 2023-03-09 MED ORDER — GLYCOPYRROLATE 0.2 MG/ML IJ SOLN
INTRAMUSCULAR | Status: DC | PRN
  Administered 2023-03-09: .2 mg via INTRAVENOUS

## 2023-03-09 MED ORDER — LIDOCAINE HCL (CARDIAC) PF 100 MG/5ML IV SOSY
PREFILLED_SYRINGE | INTRAVENOUS | Status: DC | PRN
  Administered 2023-03-09: 50 mg via INTRAVENOUS

## 2023-03-09 MED ORDER — SODIUM CHLORIDE 0.9 % IV SOLN: INTRAVENOUS | Status: DC

## 2023-03-09 MED ORDER — PROPOFOL 10 MG/ML IV BOLUS
INTRAVENOUS | Status: DC | PRN
  Administered 2023-03-09: 150 ug/kg/min via INTRAVENOUS
  Administered 2023-03-09: 100 mg via INTRAVENOUS

## 2023-03-09 NOTE — H&P (Signed)
Arlyss Repress, MD [address]  [address]  [place], [place] [address]  Main: [phone]  Fax: [phone] Pager: [phone]  Primary Care Physician:  Glori Luis, MD Primary Gastroenterologist:  Dr. Arlyss Repress  Pre-Procedure History & Physical: HPI:  Mary Fox is a 37 y.o. female is here for an endoscopy and colonoscopy.   Past Medical History:  Diagnosis Date   Allergic reaction 05/31/2017   Anxiety    COVID-19    09/09/21   Depression    Hiatal hernia 05/31/2019   IDA (iron deficiency anemia) 02/01/2019   IDA (iron deficiency anemia) 02/01/2019    Past Surgical History:  Procedure Laterality Date   CESAREAN SECTION  09/05/2016   CHOLECYSTECTOMY  07 31 2012   GASTRIC BYPASS      Prior to Admission medications   Medication Sig Start Date End Date Taking? Authorizing Provider  Biotin 96295 MCG TABS Take 10,000 mcg by mouth daily.    [provider]  blood glucose meter kit and supplies KIT Dispense based on patient and insurance preference. Check prn for low glucose symptoms. Dx Code: K91.1 06/07/21   Glori Luis, MD  DULoxetine (CYMBALTA) 60 MG capsule TAKE 1 CAPSULE(60 MG) BY MOUTH DAILY 02/20/23   Glori Luis, MD  Insulin Pen Needle (UNIFINE PENTIPS) 32G X 4 MM MISC Use as directed with Saxenda 04/05/22     Liraglutide -Weight Management (SAXENDA) 18 MG/3ML SOPN Inject 3 mg into the skin daily. 01/18/23   Glori Luis, MD  LORazepam (ATIVAN) 1 MG tablet Take 1 mg by mouth 2 (two) times daily. 02/17/21   [provider]    Allergies as of 02/10/2023   (No Known Allergies)    Family History  Problem Relation Age of Onset   Alcohol abuse Mother    Anxiety disorder Mother    Depression Mother    Alcohol abuse Sister    Anxiety disorder Sister    Depression Sister    Diabetes Neg Hx     Social History   Socioeconomic History   Marital status: Married    Spouse name: benjamin   Number of children: 2    Years of education: Not on file   Highest education level: Master's degree (e.g., MA, MS, MEng, MEd, MSW, MBA)  Occupational History   Not on file  Tobacco Use   Smoking status: Former    Current packs/day: 0.00    Types: Cigarettes    Quit date: 11/05/2010    Years since quitting: 12.3   Smokeless tobacco: Never  Vaping Use   Vaping status: Never Used  Substance and Sexual Activity   Alcohol use: Not Currently    Alcohol/week: 2.0 standard drinks of alcohol    Types: 2 Glasses of wine per week   Drug use: No   Sexual activity: Yes    Birth control/protection: Pill  Other Topics Concern   Not on file  Social History Narrative   Not on file   Social Determinants of Health   Financial Resource Strain: Low Risk  (02/16/2023)   Overall Financial Resource Strain (CARDIA)    Difficulty of Paying Living Expenses: Not very hard  Food Insecurity: No Food Insecurity (02/16/2023)   Hunger Vital Sign    Worried About Running Out of Food in the Last Year: Never true    Ran Out of Food in the Last Year: Never true  Transportation Needs: No Transportation Needs (02/16/2023)   PRAPARE - Transportation  Lack of Transportation (Medical): No    Lack of Transportation (Non-Medical): No  Physical Activity: Inactive (03/07/2019)   Exercise Vital Sign    Days of Exercise per Week: 0 days    Minutes of Exercise per Session: 0 min  Stress: Stress Concern Present (03/07/2019)   [employer] of Occupational Health - Occupational Stress Questionnaire    Feeling of Stress : Rather much  Social Connections: Unknown (03/07/2019)   Social Connection and Isolation Panel [NHANES]    Frequency of Communication with Friends and Family: Not on file    Frequency of Social Gatherings with Friends and Family: Not on file    Attends Religious Services: Never    Active Member of Clubs or Organizations: No    Attends Banker Meetings: Never    Marital Status: Married  Catering manager  Violence: Not At Risk (02/16/2023)   Humiliation, Afraid, Rape, and Kick questionnaire    Fear of Current or Ex-Partner: No    Emotionally Abused: No    Physically Abused: No    Sexually Abused: No    Review of Systems: See HPI, otherwise negative ROS  Physical Exam: BP 106/80   Pulse (!) 57   Temp (!) 97.3 F (36.3 C) (Temporal)   Resp 16   LMP 03/09/2023   SpO2 98%  General:   Alert,  pleasant and cooperative in NAD Head:  Normocephalic and atraumatic. Neck:  Supple; no masses or thyromegaly. Lungs:  Clear throughout to auscultation.    Heart:  Regular rate and rhythm. Abdomen:  Soft, nontender and nondistended. Normal bowel sounds, without guarding, and without rebound.   Neurologic:  Alert and  oriented x4;  grossly normal neurologically.  Impression/Plan: Mary Fox is here for an endoscopy and colonoscopy to be performed for IDA  Risks, benefits, limitations, and alternatives regarding  endoscopy and colonoscopy have been reviewed with the patient.  Questions have been answered.  All parties agreeable.   Lannette Donath, MD  03/09/2023, 10:28 AM

## 2023-03-09 NOTE — Op Note (Signed)
[facility] Gastroenterology Patient Name: Mary Fox Procedure Date: 03/09/2023 10:16 AM MRN: [id number] Account #: [id number] Date of Birth: April 28, 1985 Admit Type: Outpatient Age: 37 Room: [facility] ENDO ROOM 3 Gender: Female Note Status: Finalized Instrument Name: Colonoscope [id number] Procedure:             Colonoscopy Indications:           This is the patient's first colonoscopy, Iron                         deficiency anemia secondary to chronic blood loss Providers:             Toney Reil MD, MD Referring MD:          Yehuda Mao. Birdie Sons (Referring MD) Medicines:             General Anesthesia Complications:         No immediate complications. Estimated blood loss: None. Procedure:             Pre-Anesthesia Assessment:                        - Prior to the procedure, a History and Physical was                         performed, and patient medications and allergies were                         reviewed. The patient is competent. The risks and                         benefits of the procedure and the sedation options and                         risks were discussed with the patient. All questions                         were answered and informed consent was obtained.                         Patient identification and proposed procedure were                         verified by the physician, the nurse, the                         anesthesiologist, the anesthetist and the technician                         in the pre-procedure area in the procedure room in the                         endoscopy suite. Mental Status Examination: alert and                         oriented. Airway Examination: normal oropharyngeal                         airway and neck mobility. Respiratory Examination:  clear to auscultation. CV Examination: normal.                         Prophylactic Antibiotics: The patient does not require                          prophylactic antibiotics. Prior Anticoagulants: The                         patient has taken no anticoagulant or antiplatelet                         agents. ASA Grade Assessment: II - A patient with mild                         systemic disease. After reviewing the risks and                         benefits, the patient was deemed in satisfactory                         condition to undergo the procedure. The anesthesia                         plan was to use general anesthesia. Immediately prior                         to administration of medications, the patient was                         re-assessed for adequacy to receive sedatives. The                         heart rate, respiratory rate, oxygen saturations,                         blood pressure, adequacy of pulmonary ventilation, and                         response to care were monitored throughout the                         procedure. The physical status of the patient was                         re-assessed after the procedure.                        After obtaining informed consent, the colonoscope was                         passed under direct vision. Throughout the procedure,                         the patient's blood pressure, pulse, and oxygen                         saturations were monitored continuously. The  Colonoscope was introduced through the anus and                         advanced to the the terminal ileum, with                         identification of the appendiceal orifice and IC                         valve. The colonoscopy was performed without                         difficulty. The patient tolerated the procedure well.                         The quality of the bowel preparation was good. The                         terminal ileum, ileocecal valve, appendiceal orifice,                         and rectum were photographed. Findings:      The perianal and digital rectal  examinations were normal. Pertinent       negatives include normal sphincter tone and no palpable rectal lesions.      The terminal ileum appeared normal.      The entire examined colon appeared normal.      The retroflexed view of the distal rectum and anal verge was normal and       showed no anal or rectal abnormalities. Impression:            - The examined portion of the ileum was normal.                        - The entire examined colon is normal.                        - No specimens collected. Recommendation:        - Discharge patient to home (with escort).                        - Resume previous diet today.                        - Continue present medications.                        - Repeat colonoscopy in 10 years for screening                         purposes. Procedure Code(s):     --- Professional ---                        [phone], Colonoscopy, flexible; diagnostic, including                         collection of specimen(s) by brushing or washing, when  performed (separate procedure) Diagnosis Code(s):     --- Professional ---                        D50.0, Iron deficiency anemia secondary to blood loss                         (chronic) CPT copyright 2022 American Medical Association. All rights reserved. The codes documented in this report are preliminary and upon coder review may  be revised to meet current compliance requirements. Dr. Libby Maw Toney Reil MD, MD 03/09/2023 11:08:52 AM This report has been signed electronically. Number of Addenda: 0 Note Initiated On: 03/09/2023 10:16 AM Scope Withdrawal Time: 0 hours 5 minutes 50 seconds  Total Procedure Duration: 0 hours 11 minutes 40 seconds  Estimated Blood Loss:  Estimated blood loss: none. Estimated blood loss: none.      [facility]

## 2023-03-09 NOTE — Anesthesia Preprocedure Evaluation (Signed)
Anesthesia Evaluation  Patient identified by MRN, date of birth, ID band Patient awake    Reviewed: Allergy & Precautions, NPO status , Patient's Chart, lab work & pertinent test results  History of Anesthesia Complications Negative for: history of anesthetic complications  Airway Mallampati: II  TM Distance: >3 FB Neck ROM: full    Dental no notable dental hx.    Pulmonary neg pulmonary ROS, former smoker   Pulmonary exam normal        Cardiovascular negative cardio ROS Normal cardiovascular exam     Neuro/Psych  PSYCHIATRIC DISORDERS Anxiety Depression    negative neurological ROS     GI/Hepatic Neg liver ROS, hiatal hernia,,,  Endo/Other  negative endocrine ROS    Renal/GU negative Renal ROS  negative genitourinary   Musculoskeletal   Abdominal   Peds  Hematology  (+) Blood dyscrasia, anemia   Anesthesia Other Findings Past Medical History: 05/31/2017: Allergic reaction No date: Anxiety No date: COVID-19     Comment:  09/09/21 No date: Depression 05/31/2019: Hiatal hernia 02/01/2019: IDA (iron deficiency anemia) 02/01/2019: IDA (iron deficiency anemia)  Past Surgical History: 09/05/2016: CESAREAN SECTION 07 31 2012: CHOLECYSTECTOMY No date: GASTRIC BYPASS     Reproductive/Obstetrics negative OB ROS                              Anesthesia Physical Anesthesia Plan  ASA: 2  Anesthesia Plan: General   Post-op Pain Management: Minimal or no pain anticipated   Induction: Intravenous  PONV Risk Score and Plan: 2 and Propofol infusion and TIVA  Airway Management Planned: Natural Airway and Nasal Cannula  Additional Equipment:   Intra-op Plan:   Post-operative Plan:   Informed Consent: I have reviewed the patients History and Physical, chart, labs and discussed the procedure including the risks, benefits and alternatives for the proposed anesthesia with the patient  or authorized representative who has indicated his/her understanding and acceptance.     Dental Advisory Given  Plan Discussed with: Anesthesiologist, CRNA and Surgeon  Anesthesia Plan Comments: (Patient consented for risks of anesthesia including but not limited to:  - adverse reactions to medications - risk of airway placement if required - damage to eyes, teeth, lips or other oral mucosa - nerve damage due to positioning  - sore throat or hoarseness - Damage to heart, brain, nerves, lungs, other parts of body or loss of life  Patient voiced understanding and assent.)         Anesthesia Quick Evaluation

## 2023-03-09 NOTE — Op Note (Signed)
[facility] Gastroenterology Patient Name: Mary Fox Procedure Date: 03/09/2023 10:16 AM MRN: [id number] Account #: [id number] Date of Birth: [birth year] Admit Type: Outpatient Age: 37 Room: [facility] ENDO ROOM 3 Gender: Female Note Status: Finalized Instrument Name: Patton Salles Endoscope [id number] Procedure:             Upper GI endoscopy Indications:           Epigastric abdominal pain Providers:             Toney Reil MD, MD Referring MD:          Yehuda Mao. Birdie Sons (Referring MD) Medicines:             General Anesthesia Complications:         No immediate complications. Estimated blood loss: None. Procedure:             Pre-Anesthesia Assessment:                        - Prior to the procedure, a History and Physical was                         performed, and patient medications and allergies were                         reviewed. The patient is competent. The risks and                         benefits of the procedure and the sedation options and                         risks were discussed with the patient. All questions                         were answered and informed consent was obtained.                         Patient identification and proposed procedure were                         verified by the physician, the nurse, the                         anesthesiologist, the anesthetist and the technician                         in the pre-procedure area in the procedure room in the                         endoscopy suite. Mental Status Examination: alert and                         oriented. Airway Examination: normal oropharyngeal                         airway and neck mobility. Respiratory Examination:                         clear to auscultation. CV Examination: normal.  Prophylactic Antibiotics: The patient does not require                         prophylactic antibiotics. Prior Anticoagulants: The                         patient  has taken no anticoagulant or antiplatelet                         agents. ASA Grade Assessment: II - A patient with mild                         systemic disease. After reviewing the risks and                         benefits, the patient was deemed in satisfactory                         condition to undergo the procedure. The anesthesia                         plan was to use general anesthesia. Immediately prior                         to administration of medications, the patient was                         re-assessed for adequacy to receive sedatives. The                         heart rate, respiratory rate, oxygen saturations,                         blood pressure, adequacy of pulmonary ventilation, and                         response to care were monitored throughout the                         procedure. The physical status of the patient was                         re-assessed after the procedure.                        After obtaining informed consent, the endoscope was                         passed under direct vision. Throughout the procedure,                         the patient's blood pressure, pulse, and oxygen                         saturations were monitored continuously. The Endoscope                         was introduced through the mouth, and advanced to the  afferent and efferent jejunal loops. The upper GI                         endoscopy was accomplished without difficulty. The                         patient tolerated the procedure well. Findings:      Evidence of a Roux-en-Y gastrojejunostomy was found. The gastrojejunal       anastomosis was characterized by healthy appearing mucosa. This was       traversed. The pouch-to-jejunum limb was characterized by healthy       appearing mucosa. The jejunojejunal anastomosis was characterized by       healthy appearing mucosa. The duodenum-to-jejunum limb was examined.       Biopsies were  taken with a cold forceps for histology.      Esophagogastric landmarks were identified: the gastroesophageal junction       was found at 38 cm from the incisors.      The gastroesophageal junction and examined esophagus were normal. Impression:            - Roux-en-Y gastrojejunostomy with gastrojejunal                         anastomosis characterized by healthy appearing mucosa.                         Biopsied.                        - Esophagogastric landmarks identified.                        - Normal gastroesophageal junction and esophagus. Recommendation:        - Await pathology results.                        - Proceed with colonoscopy as scheduled                        See colonoscopy report                        - Use a proton pump inhibitor PO daily for 8 weeks. Procedure Code(s):     --- Professional ---                        [phone], Esophagogastroduodenoscopy, flexible,                         transoral; with biopsy, single or multiple Diagnosis Code(s):     --- Professional ---                        Z98.0, Intestinal bypass and anastomosis status                        R10.13, Epigastric pain CPT copyright 2022 American Medical Association. All rights reserved. The codes documented in this report are preliminary and upon coder review may  be revised to meet current compliance requirements. Dr. Libby Maw Toney Reil MD, MD 03/09/2023 10:53:08 AM This report has been signed electronically. Number  of Addenda: 0 Note Initiated On: 03/09/2023 10:16 AM Estimated Blood Loss:  Estimated blood loss: none.      [facility]

## 2023-03-09 NOTE — Anesthesia Postprocedure Evaluation (Signed)
Anesthesia Post Note  Patient: Mary Fox  Procedure(s) Performed: COLONOSCOPY WITH PROPOFOL ESOPHAGOGASTRODUODENOSCOPY (EGD) WITH PROPOFOL BIOPSY  Patient location during evaluation: Endoscopy Anesthesia Type: General Level of consciousness: awake and alert Pain management: pain level controlled Vital Signs Assessment: post-procedure vital signs reviewed and stable Respiratory status: spontaneous breathing, nonlabored ventilation, respiratory function stable and patient connected to nasal cannula oxygen Cardiovascular status: blood pressure returned to baseline and stable Postop Assessment: no apparent nausea or vomiting Anesthetic complications: no   No notable events documented.   Last Vitals:  Vitals:   03/09/23 1023 03/09/23 1112  BP: 106/80 97/69  Pulse: (!) 57 68  Resp: 16 19  Temp: (!) 36.3 C   SpO2: 98% 100%    Last Pain:  Vitals:   03/09/23 1023  TempSrc: Temporal                 Louie Boston

## 2023-03-09 NOTE — Transfer of Care (Signed)
Immediate Anesthesia Transfer of Care Note  Patient: Mary Fox  Procedure(s) Performed: COLONOSCOPY WITH PROPOFOL ESOPHAGOGASTRODUODENOSCOPY (EGD) WITH PROPOFOL BIOPSY  Patient Location: Endoscopy Unit  Anesthesia Type:General  Level of Consciousness: sedated and drowsy  Airway & Oxygen Therapy: Patient Spontanous Breathing  Post-op Assessment: Report given to RN and Post -op Vital signs reviewed and stable  Post vital signs: Reviewed and stable  Last Vitals:  Vitals Value Taken Time  BP 97/69 03/09/23 1112  Temp    Pulse 69 03/09/23 1113  Resp 26 03/09/23 1113  SpO2 100 % 03/09/23 1113  Vitals shown include unfiled device data.  Last Pain:  Vitals:   03/09/23 1023  TempSrc: Temporal         Complications: No notable events documented.

## 2023-03-09 NOTE — Anesthesia Procedure Notes (Signed)
Date/Time: 03/09/2023 10:38 AM  Performed by: Ginger Carne, CRNAPre-anesthesia Checklist: Emergency Drugs available, Patient identified, Suction available, Patient being monitored and Timeout performed Patient Re-evaluated:Patient Re-evaluated prior to induction Oxygen Delivery Method: Nasal cannula Preoxygenation: Pre-oxygenation with 100% oxygen Induction Type: IV induction

## 2023-03-10 ENCOUNTER — Encounter: Payer: Self-pay | Admitting: Gastroenterology

## 2023-03-13 LAB — SURGICAL PATHOLOGY

## 2023-03-15 ENCOUNTER — Inpatient Hospital Stay: Payer: Managed Care, Other (non HMO)

## 2023-03-20 ENCOUNTER — Telehealth: Payer: Self-pay

## 2023-03-20 ENCOUNTER — Inpatient Hospital Stay: Payer: Managed Care, Other (non HMO)

## 2023-03-20 NOTE — Telephone Encounter (Signed)
The patient called and left a voicemail calling back to speak with Morrie Sheldon. I called the patient back to inform him we received his message, and left a voicemail to call back.

## 2023-03-20 NOTE — Telephone Encounter (Signed)
Patient verbalized understanding of results. She states she is already doing the IV iron infusions with Hematology

## 2023-03-20 NOTE — Telephone Encounter (Signed)
Called and left a message for call back  

## 2023-03-20 NOTE — Telephone Encounter (Signed)
-----   Message from [facility] sent at 03/15/2023  3:08 PM EST ----- Pathology results from gastric biopsies came back negative.  If patient is agreeable, recommend referral to hematology to discuss about IV iron severe iron deficiency anemia  Rohini Vanga

## 2023-03-20 NOTE — Telephone Encounter (Signed)
 Pt returning call requesting call back.

## 2023-03-21 ENCOUNTER — Inpatient Hospital Stay: Payer: Managed Care, Other (non HMO)

## 2023-03-22 ENCOUNTER — Other Ambulatory Visit: Payer: Self-pay | Admitting: Family Medicine

## 2023-03-24 ENCOUNTER — Other Ambulatory Visit: Payer: Self-pay | Admitting: Family Medicine

## 2023-03-24 ENCOUNTER — Other Ambulatory Visit: Payer: Self-pay

## 2023-03-27 ENCOUNTER — Inpatient Hospital Stay: Payer: Managed Care, Other (non HMO) | Attending: Oncology

## 2023-03-27 VITALS — BP 106/67 | HR 62 | Temp 97.6°F | Resp 16

## 2023-03-27 DIAGNOSIS — D509 Iron deficiency anemia, unspecified: Secondary | ICD-10-CM | POA: Diagnosis present

## 2023-03-27 DIAGNOSIS — D500 Iron deficiency anemia secondary to blood loss (chronic): Secondary | ICD-10-CM

## 2023-03-27 MED ORDER — IRON SUCROSE 20 MG/ML IV SOLN
200.0000 mg | Freq: Once | INTRAVENOUS | Status: AC
  Administered 2023-03-27: 200 mg via INTRAVENOUS
  Filled 2023-03-27: qty 10

## 2023-03-27 NOTE — Progress Notes (Signed)
Patient declined 30 min observation period post venofer infusion.  Pt tolerated treatment well with no complaints.  Pt left infusion suite stable and ambulatory.

## 2023-03-27 NOTE — Patient Instructions (Signed)
Iron Sucrose Injection What is this medication? IRON SUCROSE (EYE ern SOO krose) treats low levels of iron (iron deficiency anemia) in people with kidney disease. Iron is a mineral that plays an important role in making red blood cells, which carry oxygen from your lungs to the rest of your body. This medicine may be used for other purposes; ask your health care provider or pharmacist if you have questions. COMMON BRAND NAME(S): Venofer What should I tell my care team before I take this medication? They need to know if you have any of these conditions: Anemia not caused by low iron levels Heart disease High levels of iron in the blood Kidney disease Liver disease An unusual or allergic reaction to iron, other medications, foods, dyes, or preservatives Pregnant or trying to get pregnant Breastfeeding How should I use this medication? This medication is for infusion into a vein. It is given in a hospital or clinic setting. Talk to your care team about the use of this medication in children. While this medication may be prescribed for children as young as 2 years for selected conditions, precautions do apply. Overdosage: If you think you have taken too much of this medicine contact a poison control center or emergency room at once. NOTE: This medicine is only for you. Do not share this medicine with others. What if I miss a dose? Keep appointments for follow-up doses. It is important not to miss your dose. Call your care team if you are unable to keep an appointment. What may interact with this medication? Do not take this medication with any of the following: Deferoxamine Dimercaprol Other iron products This medication may also interact with the following: Chloramphenicol Deferasirox This list may not describe all possible interactions. Give your health care provider a list of all the medicines, herbs, non-prescription drugs, or dietary supplements you use. Also tell them if you smoke,  drink alcohol, or use illegal drugs. Some items may interact with your medicine. What should I watch for while using this medication? Visit your care team regularly. Tell your care team if your symptoms do not start to get better or if they get worse. You may need blood work done while you are taking this medication. You may need to follow a special diet. Talk to your care team. Foods that contain iron include: whole grains/cereals, dried fruits, beans, or peas, leafy green vegetables, and organ meats (liver, kidney). What side effects may I notice from receiving this medication? Side effects that you should report to your care team as soon as possible: Allergic reactions--skin rash, itching, hives, swelling of the face, lips, tongue, or throat Low blood pressure--dizziness, feeling faint or lightheaded, blurry vision Shortness of breath Side effects that usually do not require medical attention (report to your care team if they continue or are bothersome): Flushing Headache Joint pain Muscle pain Nausea Pain, redness, or irritation at injection site This list may not describe all possible side effects. Call your doctor for medical advice about side effects. You may report side effects to FDA at 1-800-FDA-1088. Where should I keep my medication? This medication is given in a hospital or clinic. It will not be stored at home. NOTE: This sheet is a summary. It may not cover all possible information. If you have questions about this medicine, talk to your doctor, pharmacist, or health care provider.  2024 Elsevier/Gold Standard (2022-09-09 00:00:00)

## 2023-03-28 ENCOUNTER — Other Ambulatory Visit: Payer: Self-pay

## 2023-03-28 MED FILL — Liraglutide (Weight Mngmt) Soln Pen-Inj 18 MG/3ML (6 MG/ML): SUBCUTANEOUS | 30 days supply | Qty: 15 | Fill #0 | Status: CN

## 2023-03-29 ENCOUNTER — Other Ambulatory Visit: Payer: Self-pay

## 2023-03-31 ENCOUNTER — Encounter: Payer: Self-pay | Admitting: Family Medicine

## 2023-03-31 ENCOUNTER — Encounter: Payer: Self-pay | Admitting: Oncology

## 2023-03-31 ENCOUNTER — Other Ambulatory Visit: Payer: Self-pay

## 2023-03-31 ENCOUNTER — Ambulatory Visit: Payer: Managed Care, Other (non HMO) | Admitting: Family Medicine

## 2023-03-31 VITALS — BP 106/70 | HR 88 | Temp 98.1°F | Ht 67.0 in | Wt 179.0 lb

## 2023-03-31 DIAGNOSIS — D509 Iron deficiency anemia, unspecified: Secondary | ICD-10-CM | POA: Diagnosis not present

## 2023-03-31 DIAGNOSIS — Z9884 Bariatric surgery status: Secondary | ICD-10-CM

## 2023-03-31 DIAGNOSIS — E66811 Obesity, class 1: Secondary | ICD-10-CM | POA: Diagnosis not present

## 2023-03-31 DIAGNOSIS — F39 Unspecified mood [affective] disorder: Secondary | ICD-10-CM

## 2023-03-31 MED ORDER — SEMAGLUTIDE-WEIGHT MANAGEMENT 0.5 MG/0.5ML ~~LOC~~ SOAJ
0.5000 mg | SUBCUTANEOUS | 0 refills | Status: AC
  Filled 2023-03-31 – 2023-06-08 (×7): qty 2, 28d supply, fill #0

## 2023-03-31 MED ORDER — ACCRUFER 30 MG PO CAPS: 30.0000 mg | ORAL_CAPSULE | Freq: Two times a day (BID) | ORAL | Status: DC

## 2023-03-31 NOTE — Progress Notes (Addendum)
SUBJECTIVE:   Chief Complaint  Patient presents with   Establish Care    Transferring from Dr. Birdie Sons   HPI Patient presents to clinic to transfer care  Discussed the use of AI scribe software for clinical note transcription with the patient, who gave verbal consent to proceed.  History of Present Illness The patient, previously under the care of Dr. Birdie Sons, has transferred care due to the departure of their previous physician. They have been on Cymbalta for anxiety for approximately four years, which has been effective in managing their symptoms. They have also been on Saxenda for weight loss for about a year, but have only lost about ten pounds and have started to regain the weight. Despite adherence to dietary advice and regular exercise, the patient admits to occasional consumption of junk food and acknowledges a need for increased protein intake.  The patient has also been dealing with iron deficiency, which has been an ongoing issue since undergoing weight loss surgery and giving birth to twins. They have had three iron infusions to date, with the most recent one being administered just a few days prior to this consultation. They are due to see a hematologist in January for further management of this issue.  The patient has no known thyroid issues, does not smoke or drink alcohol, and is up to date on their tetanus and flu vaccines. They have had a Pap smear within the last year and are due for a mammogram next year. They have not reported any new symptoms or changes in their health status since their last visit.    PERTINENT PMH / PSH: As above  OBJECTIVE:  BP 106/70   Pulse 88   Temp 98.1 F (36.7 C)   Ht 5\' 7"  (1.702 m)   Wt 179 lb (81.2 kg)   LMP 03/09/2023   SpO2 97%   BMI 28.04 kg/m    Physical Exam Vitals reviewed.  Constitutional:      General: She is not in acute distress.    Appearance: She is not ill-appearing.  HENT:     Head: Normocephalic.      Right Ear: Tympanic membrane, ear canal and external ear normal.     Left Ear: Tympanic membrane, ear canal and external ear normal.     Nose: Nose normal.     Mouth/Throat:     Mouth: Mucous membranes are moist.  Eyes:     Extraocular Movements: Extraocular movements intact.     Conjunctiva/sclera: Conjunctivae normal.     Pupils: Pupils are equal, round, and reactive to light.  Neck:     Thyroid: No thyromegaly or thyroid tenderness.     Vascular: No carotid bruit.  Cardiovascular:     Rate and Rhythm: Normal rate and regular rhythm.     Pulses: Normal pulses.     Heart sounds: Normal heart sounds.  Pulmonary:     Effort: Pulmonary effort is normal.     Breath sounds: Normal breath sounds.  Abdominal:     General: Bowel sounds are normal. There is no distension.     Palpations: Abdomen is soft.     Tenderness: There is no abdominal tenderness. There is no right CVA tenderness, left CVA tenderness, guarding or rebound.  Musculoskeletal:        General: Normal range of motion.     Cervical back: Normal range of motion.     Right lower leg: No edema.     Left lower leg: No edema.  Lymphadenopathy:     Cervical: No cervical adenopathy.  Skin:    Capillary Refill: Capillary refill takes less than 2 seconds.  Neurological:     General: No focal deficit present.     Mental Status: She is alert and oriented to person, place, and time. Mental status is at baseline.     Motor: No weakness.  Psychiatric:        Mood and Affect: Mood normal.        Behavior: Behavior normal.        Thought Content: Thought content normal.        Judgment: Judgment normal.        03/31/2023    9:50 AM 12/28/2022    9:50 AM 05/16/2022    2:12 PM 01/24/2022    3:22 PM 09/09/2021    2:29 PM  Depression screen PHQ 2/9  Decreased Interest 0 0 1 0 0  Down, Depressed, Hopeless 0 0 0 0 0  PHQ - 2 Score 0 0 1 0 0  Altered sleeping 0 1 1    Tired, decreased energy 1 2 1     Change in appetite 0 0 0     Feeling bad or failure about yourself  0 0 0    Trouble concentrating 0 0 1    Moving slowly or fidgety/restless 0 0 0    Suicidal thoughts 0 0 0    PHQ-9 Score 1 3 4     Difficult doing work/chores Not difficult at all Somewhat difficult Not difficult at all        03/31/2023    9:50 AM 12/28/2022    9:50 AM 05/16/2022    2:12 PM 12/07/2017    9:18 AM  GAD 7 : Generalized Anxiety Score  Nervous, Anxious, on Edge 0 0 0 2  Control/stop worrying 0 0 0 2  Worry too much - different things 0 0 0 2  Trouble relaxing 0 0 0 2  Restless 0 0 0 1  Easily annoyed or irritable 1 0 0 3  Afraid - awful might happen 0 0 0 2  Total GAD 7 Score 1 0 0 14  Anxiety Difficulty Not difficult at all Not difficult at all Not difficult at all     ASSESSMENT/PLAN:  Obesity (BMI 30.0-34.9) Assessment & Plan: Limited response to Saxenda over the past year. Discussed potential dietary modifications and the importance of protein intake. -Transition to [facility] after completion of current Saxenda supply. -Start [facility] 0.5mg  dose and reassess in 3-4 weeks.  Orders: -     Semaglutide-Weight Management; Inject 0.5 mg into the skin once a week for 28 days.  Dispense: 2 mL; Refill: 0  Iron deficiency anemia, unspecified iron deficiency anemia type Assessment & Plan: History of low iron levels, recent iron infusion, and follow-up with hematologist scheduled. History of Bariatric surgery -Scheduled for last IV iron infusion 12/16 -Consider Accrufer after next lab results and discussion with hematologist. 1 month supply provided.  Orders: -     ACCRUFeR; Take 1 capsule (30 mg total) by mouth 2 (two) times daily.  History of gastric bypass -     ACCRUFeR; Take 1 capsule (30 mg total) by mouth 2 (two) times daily.  Mood disorder (HCC) Assessment & Plan: Stable on Cymbalta for the past 4 years. -Continue Cymbalta 60 mg daily    General Health Maintenance -Annual mammogram due next year.   PDMP  reviewed  Return in about 6 weeks (around 05/12/2023).  Kenney Houseman  Clent Ridges, MD

## 2023-03-31 NOTE — Telephone Encounter (Signed)
Pharmacy Patient Advocate Encounter  Received notification from [facility] that Prior Authorization for Saxenda 18MG /3ML pen-injectors  has been APPROVED from 03/02/2023 to 08/30/2023   PA #/Case ID/Reference #:  [id number]

## 2023-03-31 NOTE — Patient Instructions (Addendum)
It was a pleasure meeting you today. Thank you for allowing me to take part in your health care.  Our goals for today as we discussed include:  Stop Saxenda when completed Start Wegovy 0.5 mg weekly Follow up 3 weeks after starting [facility]  For your low iron Start Accrufer 30 mg two times a day. Samples provided x 60 tabs To start after next recheck of iron in January. This medication is sent through [facility] pharmacy and you will need to set up account if wanting to continue.   This is a list of the screening recommended for you and due dates:  Health Maintenance  Topic Date Due   COVID-19 Vaccine (3 - 2024-25 season) 12/18/2022   Flu Shot  07/17/2023*   Pap with HPV screening  02/24/2026   DTaP/Tdap/Td vaccine (2 - Tdap) 12/27/2032   HPV Vaccine  Aged Out  *Topic was postponed. The date shown is not the original due date.     Follow up 6 weeks  If you have any questions or concerns, please do not hesitate to call the office at [phone].  I look forward to our next visit and until then take care and stay safe.  Regards,   Dana Allan, MD   [facility]

## 2023-03-31 NOTE — Progress Notes (Unsigned)
Medication Samples have been provided to the patient.  Drug name: Accrufer    Strength: 30mg        Qty: 5 boxes LOT: [id number]  Exp.Date: 08/16/2025  Dosing instructions: 1 capsule 2x a day  The patient has been instructed regarding the correct time, dose, and frequency of taking this medication, including desired effects and most common side effects.   Tresa Endo  Cay Kath 10:32 AM 03/31/2023

## 2023-04-02 ENCOUNTER — Encounter: Payer: Self-pay | Admitting: Family Medicine

## 2023-04-02 DIAGNOSIS — F39 Unspecified mood [affective] disorder: Secondary | ICD-10-CM | POA: Insufficient documentation

## 2023-04-02 NOTE — Assessment & Plan Note (Signed)
Stable on Cymbalta for the past 4 years. -Continue Cymbalta 60 mg daily

## 2023-04-02 NOTE — Assessment & Plan Note (Signed)
History of low iron levels, recent iron infusion, and follow-up with hematologist scheduled. History of Bariatric surgery -Scheduled for last IV iron infusion 12/16 -Consider Accrufer after next lab results and discussion with hematologist. 1 month supply provided.

## 2023-04-02 NOTE — Assessment & Plan Note (Signed)
Limited response to Saxenda over the past year. Discussed potential dietary modifications and the importance of protein intake. -Transition to [facility] after completion of current Saxenda supply. -Start [facility] 0.5mg  dose and reassess in 3-4 weeks.

## 2023-04-03 ENCOUNTER — Inpatient Hospital Stay: Payer: Managed Care, Other (non HMO)

## 2023-04-11 ENCOUNTER — Other Ambulatory Visit (HOSPITAL_COMMUNITY): Payer: Self-pay

## 2023-04-11 ENCOUNTER — Telehealth: Payer: Self-pay | Admitting: Pharmacy Technician

## 2023-04-11 NOTE — Telephone Encounter (Signed)
Pharmacy Patient Advocate Encounter   Received notification from CoverMyMeds that prior authorization for [facility] 0.5MG /0.5ML auto-injectors is required/requested.   Insurance verification completed.   The patient is insured through [facility] .   Per test claim: PA required; PA submitted to above mentioned insurance via CoverMyMeds Key/confirmation #/EOC KEY: [id number] Status is pending

## 2023-04-13 NOTE — Telephone Encounter (Signed)
Noted  

## 2023-04-16 ENCOUNTER — Encounter: Payer: Self-pay | Admitting: Family Medicine

## 2023-04-16 DIAGNOSIS — E66811 Obesity, class 1: Secondary | ICD-10-CM

## 2023-05-01 ENCOUNTER — Other Ambulatory Visit: Payer: Self-pay

## 2023-05-02 ENCOUNTER — Other Ambulatory Visit: Payer: Self-pay

## 2023-05-05 ENCOUNTER — Other Ambulatory Visit: Payer: Self-pay

## 2023-05-05 ENCOUNTER — Telehealth: Payer: Self-pay

## 2023-05-05 NOTE — Telephone Encounter (Signed)
Pharmacy Patient Advocate Encounter   Received notification from Patient Advice Request messages that prior authorization for Wegovy 0.5MG /0.5ML auto-injectors is required/requested.   Insurance verification completed.   The patient is insured through [facility] .   Per test claim: PA required; PA submitted to above mentioned insurance via CoverMyMeds Key/confirmation #/EOC [id number] Status is pending

## 2023-05-05 NOTE — Telephone Encounter (Signed)
Pharmacy Patient Advocate Encounter  Received notification from [facility] that Prior Authorization for Reginal Lutes has been DENIED.  See denial reason below. No denial letter attached in CMM. Will attach denial letter to Media tab once received.    PA #/Case ID/Reference #: [id number]

## 2023-05-05 NOTE — Telephone Encounter (Signed)
All I can figure is that requirements have changed and they are only covering medication for pt's that have been on [facility] therapy for at least 4 months, medication has been started and stopped but most recently started 03/17/2023   Pharmacy Patient Advocate Encounter  Received notification from [facility] that Prior Authorization for [facility] has been DENIED.  See denial reason below. No denial letter attached in CMM. Will attach denial letter to Media tab once received.   PA #/Case ID/Reference #: [id number]

## 2023-05-05 NOTE — Telephone Encounter (Signed)
Pharmacy Patient Advocate Encounter  Received notification from [facility] that Prior Authorization for Mary Fox 0.5mg /dose has been DENIED.  See denial reason below. No denial letter attached in CMM. Will attach denial letter to Media tab once received.    PA #/Case ID/Reference #: [id number]  NEW PA BEING SUBMITTED IN 05/05/2023 ENCOUNTER WITH DIFFERENT ANSWERS TO QUESTIONS

## 2023-05-05 NOTE — Telephone Encounter (Signed)
Hi, apologies for the delay on the original PA. I have looked into the original PA to determine the reason for denial, and I believe that a question may have been answered incorrectly, I am resubmitting a new PA with questions answered correctly and chart notes provided. I will route decision to [facility] pool when received.  PA request has been Submitted. New Encounter created for follow up. For additional info see Pharmacy Prior Auth telephone encounter from 05/05/2023.

## 2023-05-12 ENCOUNTER — Other Ambulatory Visit: Payer: Self-pay

## 2023-05-12 ENCOUNTER — Ambulatory Visit: Payer: Managed Care, Other (non HMO) | Admitting: Family Medicine

## 2023-05-15 ENCOUNTER — Ambulatory Visit: Payer: Managed Care, Other (non HMO) | Admitting: Physician Assistant

## 2023-05-15 NOTE — Progress Notes (Deleted)
Mary Amy, PA-C [address]  [address]  [place], [place] [address]  Main: [phone]  Fax: [phone]   Primary Care Physician: Mary Allan, MD  Primary Gastroenterologist:  ***  CC: Follow-up iron deficiency anemia  HPI: Mary Fox is a 38 y.o. female returns for 54-month follow-up of iron deficiency anemia.  History of gastric bypass, bariatric surgery.  She had laparoscopic hiatal hernia repair, pouch revision, small bowel resection, JJ revision, partial gastrectomy in 05/2019.  Cholecystectomy in 2012.  C-section 2018.   03/09/2023 EGD by Dr. Allegra Fox: Previous gastric bypass, healthy mucosa, normal GE junction and esophagus.  Gastric biopsies normal.  03/09/2023 colonoscopy: Normal.  No biopsies.  No polyps.  Good prep.  10-year repeat.  01/2023 labs: Hemoglobin 10.8, low iron, normal B12 and folate.  She was referred to hematology to schedule IV iron infusion.  Symptoms: She has intermittent abdominal cramping with episodes of diarrhea, suspicious for irritable bowel syndrome.  Celiac labs were negative.  Current Outpatient Medications  Medication Sig Dispense Refill   Biotin 66063 MCG TABS Take 10,000 mcg by mouth daily.     blood glucose meter kit and supplies KIT Dispense based on patient and insurance preference. Check prn for low glucose symptoms. Dx Code: K91.1 1 each 0   DULoxetine (CYMBALTA) 60 MG capsule TAKE 1 CAPSULE(60 MG) BY MOUTH DAILY 90 capsule 1   Ferric Maltol (ACCRUFER) 30 MG CAPS Take 1 capsule (30 mg total) by mouth 2 (two) times daily.     Insulin Pen Needle (UNIFINE PENTIPS) 32G X 4 MM MISC Use as directed with Saxenda 100 each 0   Semaglutide-Weight Management 0.5 MG/0.5ML SOAJ Inject 0.5 mg into the skin once a week for 28 days. 2 mL 0   No current facility-administered medications for this visit.    Allergies as of 05/15/2023   (No Known Allergies)    Past Medical History:  Diagnosis Date   Allergic reaction 05/31/2017   Anxiety     COVID-19    09/09/21   Depression    Hiatal hernia 05/31/2019   IDA (iron deficiency anemia) 02/01/2019   IDA (iron deficiency anemia) 02/01/2019    Past Surgical History:  Procedure Laterality Date   BIOPSY  03/09/2023   Procedure: BIOPSY;  Surgeon: Toney Reil, MD;  Location: [facility] ENDOSCOPY;  Service: Gastroenterology;;   CESAREAN SECTION  09/05/2016   CHOLECYSTECTOMY  07 31 2012   COLONOSCOPY WITH PROPOFOL N/A 03/09/2023   Procedure: COLONOSCOPY WITH PROPOFOL;  Surgeon: Toney Reil, MD;  Location: [facility] ENDOSCOPY;  Service: Gastroenterology;  Laterality: N/A;   ESOPHAGOGASTRODUODENOSCOPY (EGD) WITH PROPOFOL N/A 03/09/2023   Procedure: ESOPHAGOGASTRODUODENOSCOPY (EGD) WITH PROPOFOL;  Surgeon: Toney Reil, MD;  Location: [facility] ENDOSCOPY;  Service: Gastroenterology;  Laterality: N/A;   GASTRIC BYPASS      Review of Systems:    All systems reviewed and negative except where noted in HPI.   Physical Examination:   There were no vitals taken for this visit.  General: Well-nourished, well-developed in no acute distress.  Lungs: Clear to auscultation bilaterally. Non-labored. Heart: Regular rate and rhythm, no murmurs rubs or gallops.  Abdomen: Bowel sounds are normal; Abdomen is Soft; No hepatosplenomegaly, masses or hernias;  No Abdominal Tenderness; No guarding or rebound tenderness. Neuro: Alert and oriented x 3.  Grossly intact.  Psych: Alert and cooperative, normal mood and affect.   Imaging Studies: No results found.  Assessment and Plan:   Mary Fox is a 38  y.o. y/o female 34-month follow-up of iron deficiency anemia.  1.  Iron deficiency anemia most likely due to previous gastric bypass  Recent EGD and colonoscopy were normal.  Reassurance.  Lab: CBC, iron panel, ferritin  Capsule endoscopy?  Continue IV iron infusion through hematology  Monitor labs through PCP in the future  2.  Irritable bowel syndrome with diarrhea    Mary Amy,  PA-C  Follow up ***  BP check ***

## 2023-05-19 ENCOUNTER — Telehealth: Payer: Self-pay

## 2023-05-19 NOTE — Telephone Encounter (Signed)
 Appt cancelled

## 2023-05-22 ENCOUNTER — Other Ambulatory Visit: Payer: Self-pay | Admitting: Obstetrics and Gynecology

## 2023-05-22 ENCOUNTER — Inpatient Hospital Stay: Payer: Managed Care, Other (non HMO) | Admitting: Oncology

## 2023-05-22 DIAGNOSIS — Z3041 Encounter for surveillance of contraceptive pills: Secondary | ICD-10-CM

## 2023-05-30 ENCOUNTER — Other Ambulatory Visit: Payer: Self-pay

## 2023-05-31 ENCOUNTER — Other Ambulatory Visit: Payer: Self-pay | Admitting: Family Medicine

## 2023-05-31 ENCOUNTER — Other Ambulatory Visit: Payer: Self-pay

## 2023-05-31 DIAGNOSIS — E66811 Obesity, class 1: Secondary | ICD-10-CM

## 2023-05-31 DIAGNOSIS — Z9884 Bariatric surgery status: Secondary | ICD-10-CM

## 2023-05-31 MED ORDER — SEMAGLUTIDE-WEIGHT MANAGEMENT 0.25 MG/0.5ML ~~LOC~~ SOAJ: 0.2500 mg | SUBCUTANEOUS | 0 refills | Status: DC

## 2023-05-31 NOTE — Telephone Encounter (Signed)
Pt requesting

## 2023-06-06 ENCOUNTER — Encounter: Payer: Self-pay | Admitting: Family Medicine

## 2023-06-06 NOTE — Telephone Encounter (Signed)
PA has been denied multiple time. Pt does not meet insurance requirements for CV risk reductions.   See below:   Myocardial infarction (MI), (2) Prior ischemic or hemorrhagic stroke, (3) Symptomatic peripheral arterial disease (PAD) as evidence by one of the following (a) Intermittent claudication with ankle-brachial index (ABI) less than 0.85 (at rest); (b) Peripheral arterial revascularization procedure; (c) Amputation due to atherosclerotic disease  Thanks

## 2023-06-06 NOTE — Telephone Encounter (Signed)
 Pt informed

## 2023-06-07 ENCOUNTER — Other Ambulatory Visit: Payer: Self-pay

## 2023-06-08 ENCOUNTER — Other Ambulatory Visit: Payer: Self-pay

## 2023-06-09 ENCOUNTER — Other Ambulatory Visit (HOSPITAL_COMMUNITY): Payer: Self-pay

## 2023-06-21 NOTE — Progress Notes (Deleted)
 PCP:  Dana Allan, MD   No chief complaint on file.    HPI:      [name] is a 38 y.o. G1P0102 whose LMP was No LMP recorded., presents today for her annual examination.  Her menses are regular every 28-30 days, lasting 3 days.  Dysmenorrhea mild, occurring first 1-2 days of flow. She does not usually have intermenstrual bleeding but has mid cycle the past 3 cycles, no late/missed pills. Started yaz 9/21 for anxiety with worsening sx/emotional lability before her menses with Sx improvement. Hx of anemia requiring iron transfusions in past. Normal CBC 8/21  Sex activity: single partner, contraception - OCP (estrogen/progesterone). Has had worsening dyspareunia past few months, no bleeding.  Last Pap: 02/24/21  Results were: no abnormalities /neg HPV DNA   Pt with increased vag d/c with itching/odor intermittently for past 3-4 months. Treated with monistat-1 a few wks ago with some sx improvement but sx persist.  There is no FH of breast cancer. There is no FH of ovarian cancer. The patient does not do self-breast exams.  Tobacco use: The patient denies current or previous tobacco use. Alcohol use: none No drug use.  Exercise: min active  She does get adequate calcium and Vitamin D in her diet. Labs through work.     Past Medical History:  Diagnosis Date   Allergic reaction 05/31/2017   Anxiety    COVID-19    09/09/21   Depression    Hiatal hernia 05/31/2019   IDA (iron deficiency anemia) 02/01/2019   IDA (iron deficiency anemia) 02/01/2019    Past Surgical History:  Procedure Laterality Date   BIOPSY  03/09/2023   Procedure: BIOPSY;  Surgeon: Toney Reil, MD;  Location: [facility] ENDOSCOPY;  Service: Gastroenterology;;   CESAREAN SECTION  09/05/2016   CHOLECYSTECTOMY  07 31 2012   COLONOSCOPY WITH PROPOFOL N/A 03/09/2023   Procedure: COLONOSCOPY WITH PROPOFOL;  Surgeon: Toney Reil, MD;  Location: [facility] ENDOSCOPY;  Service: Gastroenterology;   Laterality: N/A;   ESOPHAGOGASTRODUODENOSCOPY (EGD) WITH PROPOFOL N/A 03/09/2023   Procedure: ESOPHAGOGASTRODUODENOSCOPY (EGD) WITH PROPOFOL;  Surgeon: Toney Reil, MD;  Location: [facility] ENDOSCOPY;  Service: Gastroenterology;  Laterality: N/A;   GASTRIC BYPASS      Family History  Problem Relation Age of Onset   Alcohol abuse Mother    Anxiety disorder Mother    Depression Mother    Alcohol abuse Sister    Anxiety disorder Sister    Depression Sister    Diabetes Neg Hx     Social History   Socioeconomic History   Marital status: Married    Spouse name: benjamin   Number of children: 2   Years of education: Not on file   Highest education level: Master's degree (e.g., MA, MS, MEng, MEd, MSW, MBA)  Occupational History   Not on file  Tobacco Use   Smoking status: Former    Current packs/day: 0.00    Types: Cigarettes    Quit date: 11/05/2010    Years since quitting: 12.6   Smokeless tobacco: Never  Vaping Use   Vaping status: Never Used  Substance and Sexual Activity   Alcohol use: Not Currently    Alcohol/week: 2.0 standard drinks of alcohol    Types: 2 Glasses of wine per week   Drug use: No   Sexual activity: Yes    Birth control/protection: Pill  Other Topics Concern   Not on file  Social History Narrative   Not on file  Social Drivers of Corporate investment banker Strain: Low Risk  (02/16/2023)   Overall Financial Resource Strain (CARDIA)    Difficulty of Paying Living Expenses: Not very hard  Food Insecurity: No Food Insecurity (02/16/2023)   Hunger Vital Sign    Worried About Running Out of Food in the Last Year: Never true    Ran Out of Food in the Last Year: Never true  Transportation Needs: No Transportation Needs (02/16/2023)   PRAPARE - Administrator, Civil Service (Medical): No    Lack of Transportation (Non-Medical): No  Physical Activity: Inactive (03/07/2019)   Exercise Vital Sign    Days of Exercise per Week: 0 days     Minutes of Exercise per Session: 0 min  Stress: Stress Concern Present (03/07/2019)   [employer] of Occupational Health - Occupational Stress Questionnaire    Feeling of Stress : Rather much  Social Connections: Unknown (03/07/2019)   Social Connection and Isolation Panel [NHANES]    Frequency of Communication with Friends and Family: Not on file    Frequency of Social Gatherings with Friends and Family: Not on file    Attends Religious Services: Never    Active Member of Clubs or Organizations: No    Attends Banker Meetings: Never    Marital Status: Married  Catering manager Violence: Not At Risk (02/16/2023)   Humiliation, Afraid, Rape, and Kick questionnaire    Fear of Current or Ex-Partner: No    Emotionally Abused: No    Physically Abused: No    Sexually Abused: No     Current Outpatient Medications:    Biotin 16109 MCG TABS, Take 10,000 mcg by mouth daily., Disp: , Rfl:    blood glucose meter kit and supplies KIT, Dispense based on patient and insurance preference. Check prn for low glucose symptoms. Dx Code: K91.1, Disp: 1 each, Rfl: 0   DULoxetine (CYMBALTA) 60 MG capsule, TAKE 1 CAPSULE(60 MG) BY MOUTH DAILY, Disp: 90 capsule, Rfl: 1   Ferric Maltol (ACCRUFER) 30 MG CAPS, Take 1 capsule (30 mg total) by mouth 2 (two) times daily., Disp: , Rfl:    Insulin Pen Needle (UNIFINE PENTIPS) 32G X 4 MM MISC, Use as directed with Saxenda, Disp: 100 each, Rfl: 0   Semaglutide-Weight Management 0.25 MG/0.5ML SOAJ, Inject 0.25 mg into the skin once a week for 28 days., Disp: 2 mL, Rfl: 0     ROS:  Review of Systems  Constitutional:  Negative for fatigue, fever and unexpected weight change.  Respiratory:  Negative for cough, shortness of breath and wheezing.   Cardiovascular:  Negative for chest pain, palpitations and leg swelling.  Gastrointestinal:  Negative for blood in stool, constipation, diarrhea, nausea and vomiting.  Endocrine: Negative for cold  intolerance, heat intolerance and polyuria.  Genitourinary:  Positive for dyspareunia, vaginal bleeding and vaginal discharge. Negative for dysuria, flank pain, frequency, genital sores, hematuria, menstrual problem, pelvic pain, urgency and vaginal pain.  Musculoskeletal:  Negative for back pain, joint swelling and myalgias.  Skin:  Negative for rash.  Neurological:  Negative for dizziness, syncope, light-headedness, numbness and headaches.  Hematological:  Negative for adenopathy.  Psychiatric/Behavioral:  Negative for agitation, confusion, sleep disturbance and suicidal ideas. The patient is not nervous/anxious.   BREAST: No symptoms   Objective: There were no vitals taken for this visit.   Physical Exam Constitutional:      Appearance: She is well-developed.  Genitourinary:     Vulva normal.  Right Labia: No rash, tenderness or lesions.    Left Labia: No tenderness, lesions or rash.    No vaginal discharge, erythema or tenderness.      Right Adnexa: not tender and no mass present.    Left Adnexa: not tender and no mass present.    Cervical polyp present.     No cervical friability.     Uterus is not enlarged or tender.  Breasts:    Right: No mass, nipple discharge, skin change or tenderness.     Left: No mass, nipple discharge, skin change or tenderness.  Neck:     Thyroid: No thyromegaly.  Cardiovascular:     Rate and Rhythm: Normal rate and regular rhythm.     Heart sounds: Normal heart sounds. No murmur heard. Pulmonary:     Effort: Pulmonary effort is normal.     Breath sounds: Normal breath sounds.  Abdominal:     Palpations: Abdomen is soft.     Tenderness: There is no abdominal tenderness. There is no guarding or rebound.  Musculoskeletal:        General: Normal range of motion.     Cervical back: Normal range of motion.  Lymphadenopathy:     Cervical: No cervical adenopathy.  Neurological:     General: No focal deficit present.     Mental Status: She is  alert and oriented to person, place, and time.     Cranial Nerves: No cranial nerve deficit.  Skin:    General: Skin is warm and dry.  Psychiatric:        Mood and Affect: Mood normal.        Behavior: Behavior normal.        Thought Content: Thought content normal.        Judgment: Judgment normal.  Vitals reviewed.     Cervix visualized and polyp noted.  Ring forcep applied to cervix and twisting motion removed polyp intact.  Hemostasis obtained.   Assessment/Plan: Encounter for annual routine gynecological examination  Cervical cancer screening - Plan: IGP, Aptima HPV; pap today per pt pref  Screening for HPV (human papillomavirus) - Plan: IGP, Aptima HPV  Encounter for surveillance of contraceptive pills - Plan: drospirenone-ethinyl estradiol (YAZ) 3-0.02 MG tablet; OCP RF, may need to change prn BTB  Breakthrough bleeding on OCPs - Plan: TSH + free T4; endocx polyp removed today, rule out thyroid. See if sx resolve on next pill pack. If sx persist, will check GYN u/s. Pt to f/u via phone prn sx.   Thyroid disorder screening - Plan: TSH + free T4  Endocervical polyp - Plan: Pathology ([facility]), removed today, tolerated well. Sent to path.    No orders of the defined types were placed in this encounter.             GYN counsel adequate intake of calcium and vitamin D, diet and exercise     F/U  No follow-ups on file.  Paislynn Hegstrom B. Kaydon Husby, PA-C 06/21/2023 5:35 PM

## 2023-06-22 ENCOUNTER — Ambulatory Visit: Payer: Managed Care, Other (non HMO) | Admitting: Obstetrics and Gynecology

## 2023-06-22 DIAGNOSIS — N841 Polyp of cervix uteri: Secondary | ICD-10-CM

## 2023-06-22 DIAGNOSIS — Z01419 Encounter for gynecological examination (general) (routine) without abnormal findings: Secondary | ICD-10-CM

## 2023-06-22 DIAGNOSIS — Z3041 Encounter for surveillance of contraceptive pills: Secondary | ICD-10-CM

## 2023-06-26 ENCOUNTER — Encounter: Payer: Self-pay | Admitting: Family Medicine

## 2023-06-26 ENCOUNTER — Other Ambulatory Visit: Payer: Self-pay

## 2023-06-26 DIAGNOSIS — Z9884 Bariatric surgery status: Secondary | ICD-10-CM

## 2023-06-26 DIAGNOSIS — E66811 Obesity, class 1: Secondary | ICD-10-CM

## 2023-06-26 MED ORDER — SEMAGLUTIDE-WEIGHT MANAGEMENT 0.25 MG/0.5ML ~~LOC~~ SOAJ: 0.2500 mg | SUBCUTANEOUS | 0 refills | Status: DC

## 2023-06-27 ENCOUNTER — Ambulatory Visit: Payer: Managed Care, Other (non HMO) | Admitting: Family Medicine

## 2023-07-09 IMAGING — US US ABDOMEN LIMITED
1 series · 14 of 25 positions shown · non-contrast
Comparison: November 19, 2014 CT

CLINICAL DATA: Three months of abdominal pain and elevated LFTs.

EXAM:
ULTRASOUND ABDOMEN LIMITED RIGHT UPPER QUADRANT

[Series 1: us abdomen limited · 0.16mm/px · 14 of 35 slices shown]
[im 1/35]
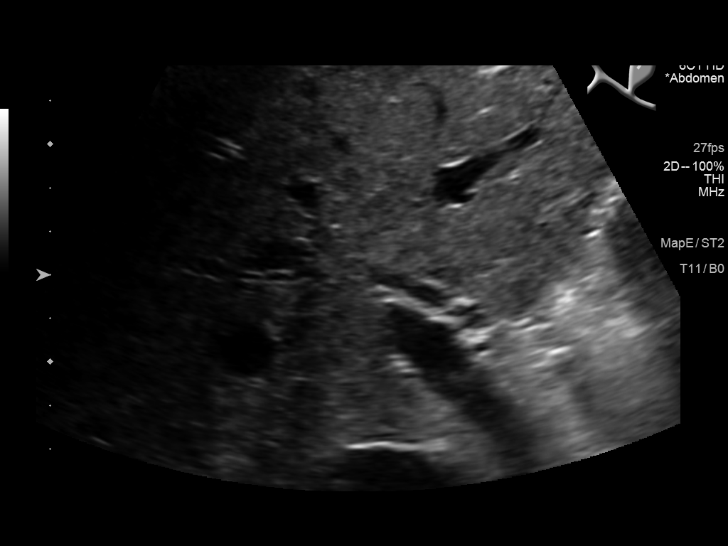
[im 3/35]
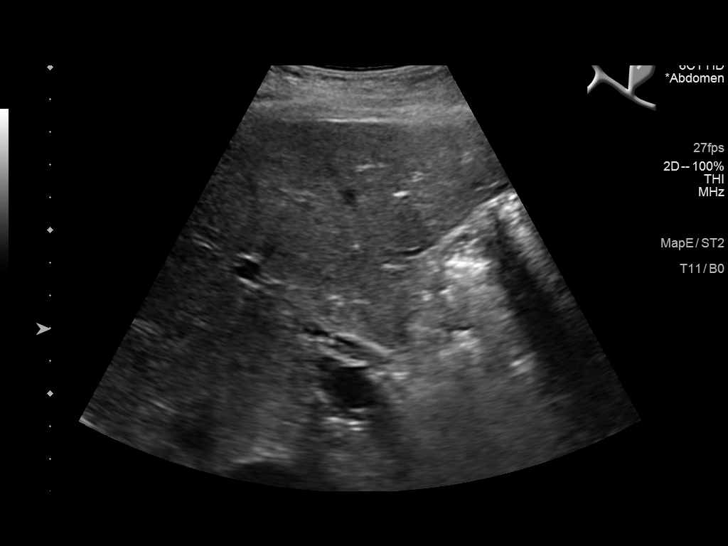
[im 6/35]
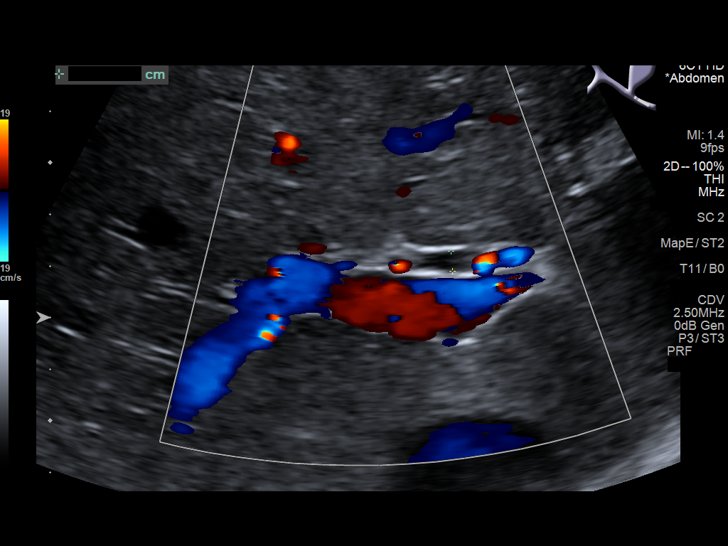
[im 9/35]
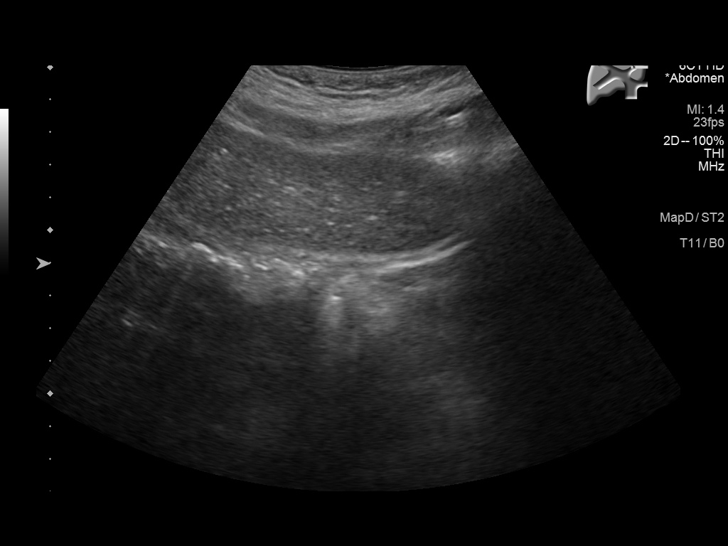
[im 12/35]
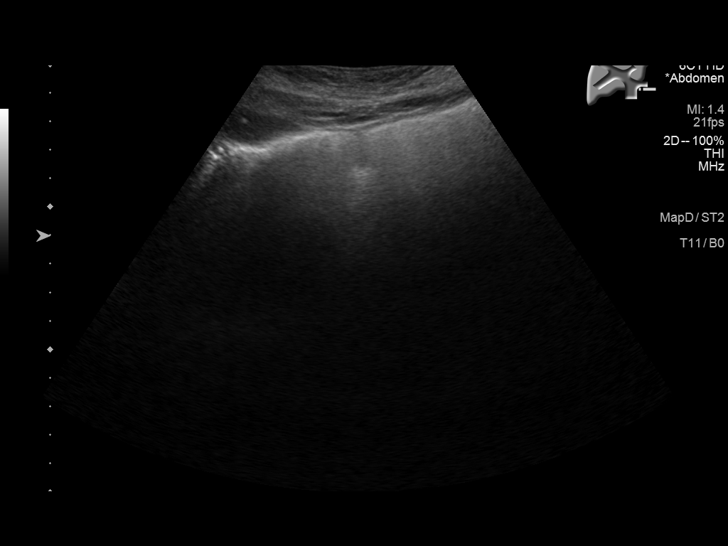
[im 13/35]
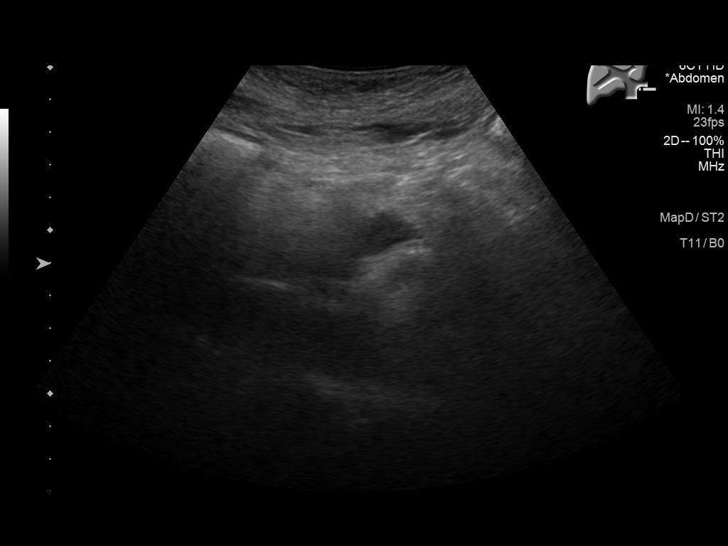
[im 16/35]
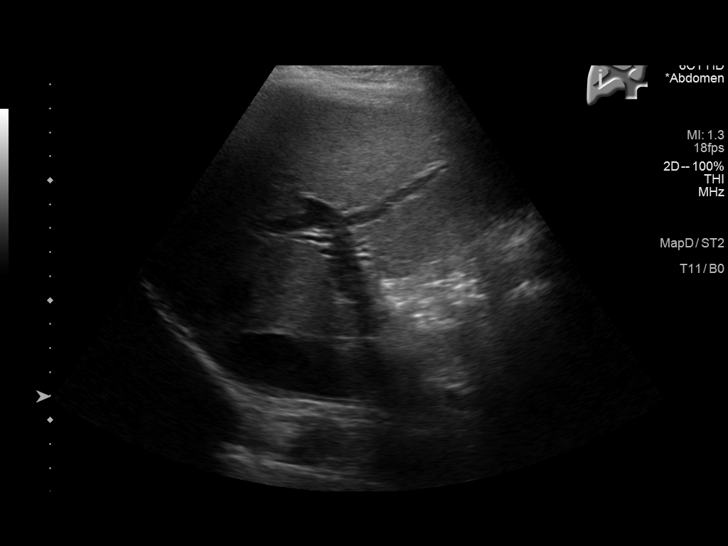
[im 19/35]
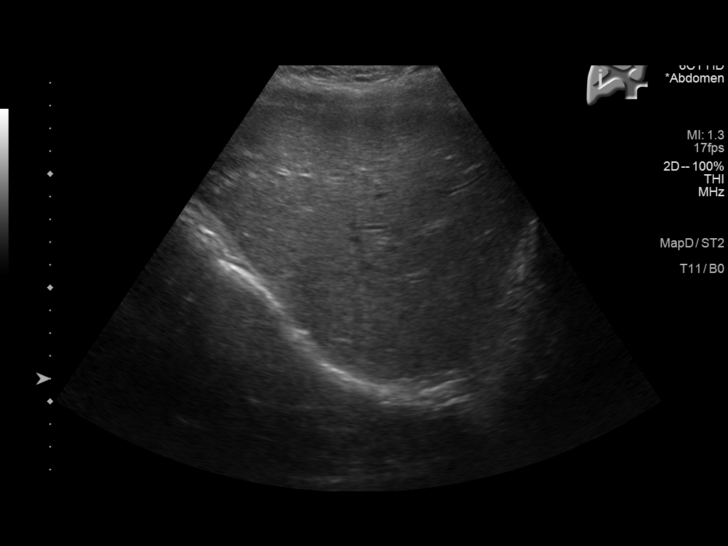
[im 22/35]
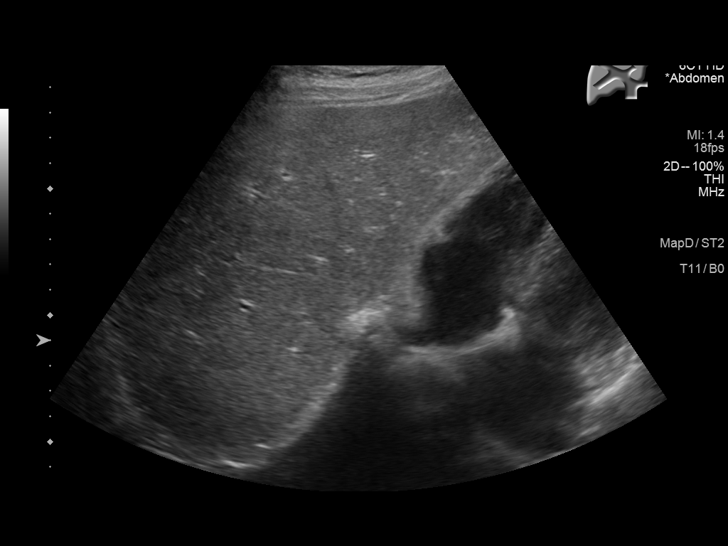
[im 23/35]
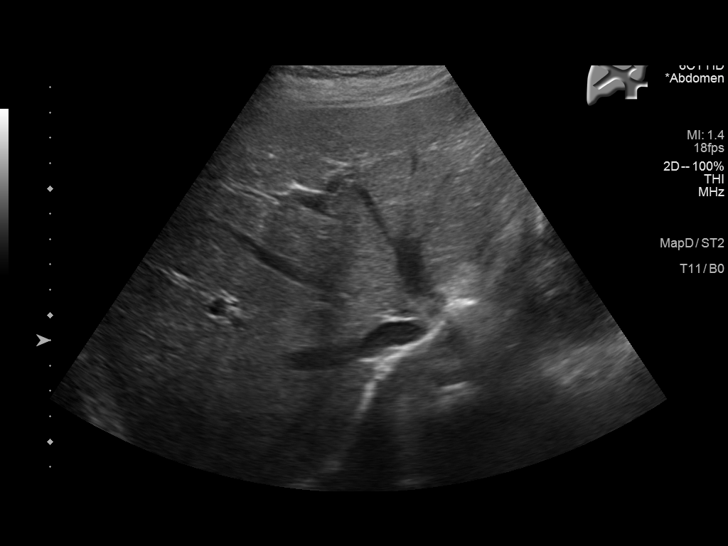
[im 26/35]
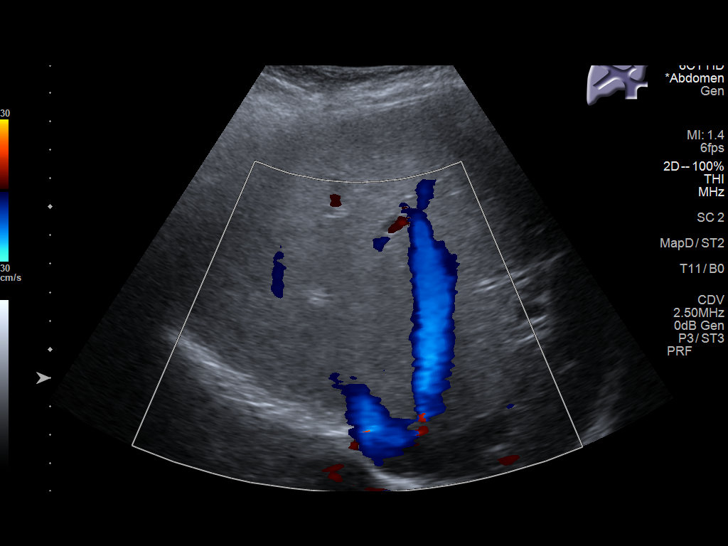
[im 29/35]
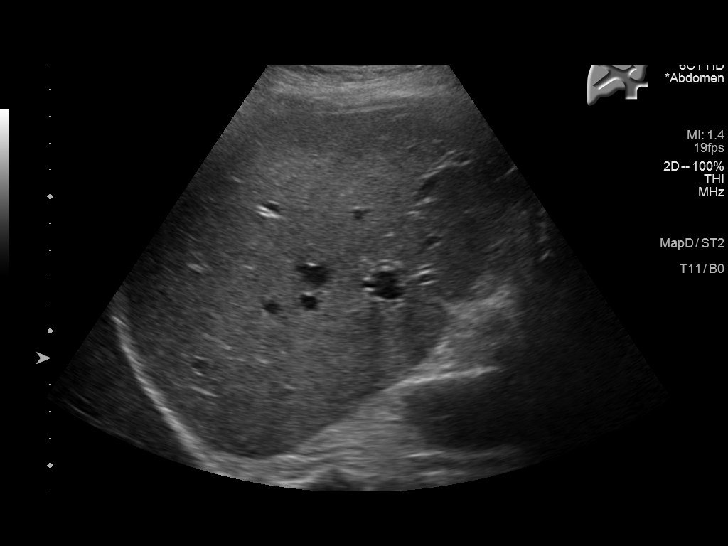
[im 32/35]
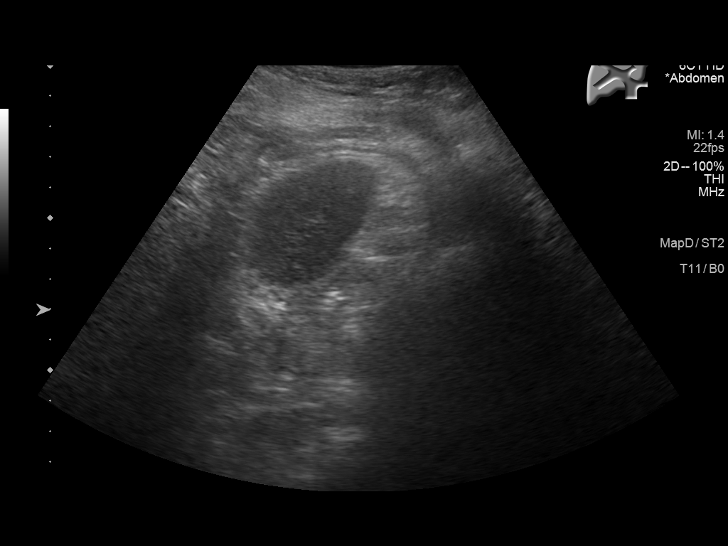
[im 35/35]
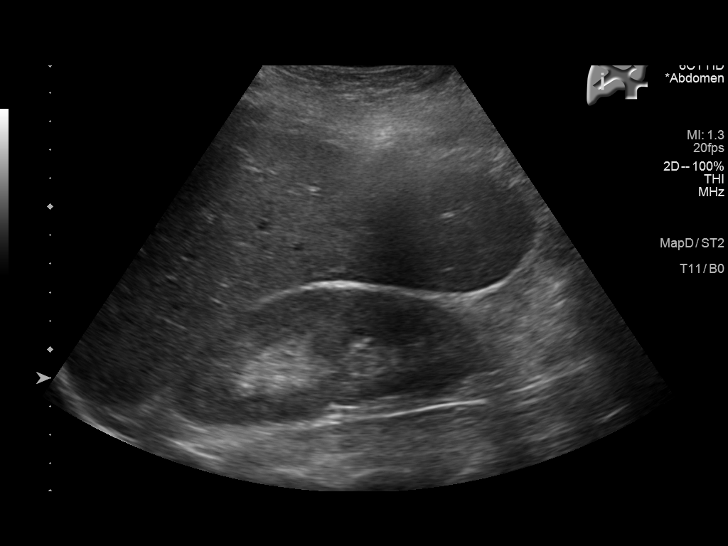

[14 of 25 positions shown; findings below may reference images not displayed]

FINDINGS: Gallbladder:

Surgically absent

Common bile duct:

Diameter: 5 mm

Liver:

No focal lesion identified. Within normal limits in parenchymal
echogenicity. Portal vein is patent on color Doppler imaging with
normal direction of blood flow towards the liver.

Other: None.
IMPRESSION: Status post cholecystectomy. Otherwise unremarkable right upper
quadrant ultrasound.

## 2023-07-13 NOTE — Progress Notes (Signed)
 PCP:  Dana Allan, MD   Chief Complaint  Patient presents with   Gynecologic Exam    Would like blood work to check for hormone levels:hot flashes, irritability   Contraception    Interested in [facility], something that helps with acne.     HPI:      [name] is a 38 y.o. G1P0102 whose LMP was Patient's last menstrual period was 06/28/2023 (approximate)., presents today for her annual examination.  Her menses are regular every 28-30 days, lasting 4 days, mod to heavy flow with small clots, no BTB (endocx polyp removed last yr causing sx), no dysmen. Hx of IDA with neg EGD bx/colonoscopy, getting Fe infusions with hematology currently. Pt with PMS mood changes; did yaz in past with sx relief but needs non-every day method. Has been on cymbalta for year. Also with increased acne on face/neck, not responding to proactiv tx. Having regular hot flashes/night sweats, under increased stress. Normal DM and TSH labs 9/24.    Sex activity: single partner, contraception - none. Did OCPs in past, would like non-every day method. No hx of HTN, DVTs, migraines with aura.  Last Pap: 02/24/21  Results were: no abnormalities /neg HPV DNA   There is no FH of breast cancer. There is no FH of ovarian cancer. The patient does not do self-breast exams.  Tobacco use: The patient denies current or previous tobacco use. Alcohol use: none No drug use.  Exercise: min active  She does get adequate calcium and Vitamin D in her diet. Labs through work/PCP.     Past Medical History:  Diagnosis Date   Allergic reaction 05/31/2017   Anxiety    COVID-19    09/09/21   Depression    Hiatal hernia 05/31/2019   IDA (iron deficiency anemia) 02/01/2019   IDA (iron deficiency anemia) 02/01/2019    Past Surgical History:  Procedure Laterality Date   BIOPSY  03/09/2023   Procedure: BIOPSY;  Surgeon: Toney Reil, MD;  Location: [facility] ENDOSCOPY;  Service: Gastroenterology;;   CESAREAN SECTION  09/05/2016    CHOLECYSTECTOMY  07 31 2012   COLONOSCOPY WITH PROPOFOL N/A 03/09/2023   Procedure: COLONOSCOPY WITH PROPOFOL;  Surgeon: Toney Reil, MD;  Location: [facility] ENDOSCOPY;  Service: Gastroenterology;  Laterality: N/A;   ESOPHAGOGASTRODUODENOSCOPY (EGD) WITH PROPOFOL N/A 03/09/2023   Procedure: ESOPHAGOGASTRODUODENOSCOPY (EGD) WITH PROPOFOL;  Surgeon: Toney Reil, MD;  Location: [facility] ENDOSCOPY;  Service: Gastroenterology;  Laterality: N/A;   GASTRIC BYPASS      Family History  Problem Relation Age of Onset   Alcohol abuse Mother    Anxiety disorder Mother    Depression Mother    Alcohol abuse Sister    Anxiety disorder Sister    Depression Sister    Diabetes Neg Hx     Social History   Socioeconomic History   Marital status: Married    Spouse name: benjamin   Number of children: 2   Years of education: Not on file   Highest education level: Master's degree (e.g., MA, MS, MEng, MEd, MSW, MBA)  Occupational History   Not on file  Tobacco Use   Smoking status: Former    Current packs/day: 0.00    Types: Cigarettes    Quit date: 11/05/2010    Years since quitting: 12.7   Smokeless tobacco: Never  Vaping Use   Vaping status: Never Used  Substance and Sexual Activity   Alcohol use: Not Currently    Alcohol/week: 2.0 standard drinks  of alcohol    Types: 2 Glasses of wine per week   Drug use: No   Sexual activity: Yes    Birth control/protection: None  Other Topics Concern   Not on file  Social History Narrative   Not on file   Social Drivers of Health   Financial Resource Strain: Low Risk  (02/16/2023)   Overall Financial Resource Strain (CARDIA)    Difficulty of Paying Living Expenses: Not very hard  Food Insecurity: No Food Insecurity (02/16/2023)   Hunger Vital Sign    Worried About Running Out of Food in the Last Year: Never true    Ran Out of Food in the Last Year: Never true  Transportation Needs: No Transportation Needs (02/16/2023)   PRAPARE -  Administrator, Civil Service (Medical): No    Lack of Transportation (Non-Medical): No  Physical Activity: Inactive (03/07/2019)   Exercise Vital Sign    Days of Exercise per Week: 0 days    Minutes of Exercise per Session: 0 min  Stress: Stress Concern Present (03/07/2019)   [employer] of Occupational Health - Occupational Stress Questionnaire    Feeling of Stress : Rather much  Social Connections: Unknown (03/07/2019)   Social Connection and Isolation Panel [NHANES]    Frequency of Communication with Friends and Family: Not on file    Frequency of Social Gatherings with Friends and Family: Not on file    Attends Religious Services: Never    Active Member of Clubs or Organizations: No    Attends Banker Meetings: Never    Marital Status: Married  Catering manager Violence: Not At Risk (02/16/2023)   Humiliation, Afraid, Rape, and Kick questionnaire    Fear of Current or Ex-Partner: No    Emotionally Abused: No    Physically Abused: No    Sexually Abused: No     Current Outpatient Medications:    Biotin 16109 MCG TABS, Take 10,000 mcg by mouth daily., Disp: , Rfl:    DULoxetine (CYMBALTA) 60 MG capsule, TAKE 1 CAPSULE(60 MG) BY MOUTH DAILY, Disp: 90 capsule, Rfl: 1   norelgestromin-ethinyl estradiol (XULANE) 150-35 MCG/24HR transdermal patch, Place 1 patch onto the skin once a week. Apply 1 patch weekly for 3 weeks, then 1 week without patch, Disp: 9 patch, Rfl: 3     ROS:  Review of Systems  Constitutional:  Negative for fatigue, fever and unexpected weight change.  Respiratory:  Negative for cough, shortness of breath and wheezing.   Cardiovascular:  Negative for chest pain, palpitations and leg swelling.  Gastrointestinal:  Negative for blood in stool, constipation, diarrhea, nausea and vomiting.  Endocrine: Negative for cold intolerance, heat intolerance and polyuria.  Genitourinary:  Negative for dyspareunia, dysuria, flank pain,  frequency, genital sores, hematuria, menstrual problem, pelvic pain, urgency, vaginal bleeding, vaginal discharge and vaginal pain.  Musculoskeletal:  Negative for back pain, joint swelling and myalgias.  Skin:  Negative for rash.  Neurological:  Negative for dizziness, syncope, light-headedness, numbness and headaches.  Hematological:  Negative for adenopathy.  Psychiatric/Behavioral:  Negative for agitation, confusion, sleep disturbance and suicidal ideas. The patient is not nervous/anxious.   BREAST: No symptoms   Objective: BP 96/65   Pulse 93   Ht 5\' 8"  (1.727 m)   Wt 170 lb (77.1 kg)   LMP 06/28/2023 (Approximate)   BMI 25.85 kg/m    Physical Exam Constitutional:      Appearance: She is well-developed.  Genitourinary:     Vulva  normal.     Right Labia: No rash, tenderness or lesions.    Left Labia: No tenderness, lesions or rash.    No vaginal discharge, erythema or tenderness.      Right Adnexa: not tender and no mass present.    Left Adnexa: not tender and no mass present.    No cervical friability or polyp.     Uterus is not enlarged or tender.  Breasts:    Right: No mass, nipple discharge, skin change or tenderness.     Left: No mass, nipple discharge, skin change or tenderness.  Neck:     Thyroid: No thyromegaly.  Cardiovascular:     Rate and Rhythm: Normal rate and regular rhythm.     Heart sounds: Normal heart sounds. No murmur heard. Pulmonary:     Effort: Pulmonary effort is normal.     Breath sounds: Normal breath sounds.  Abdominal:     Palpations: Abdomen is soft.     Tenderness: There is no abdominal tenderness. There is no guarding or rebound.  Musculoskeletal:        General: Normal range of motion.     Cervical back: Normal range of motion.  Lymphadenopathy:     Cervical: No cervical adenopathy.  Neurological:     General: No focal deficit present.     Mental Status: She is alert and oriented to person, place, and time.     Cranial Nerves:  No cranial nerve deficit.  Skin:    General: Skin is warm and dry.  Psychiatric:        Mood and Affect: Mood normal.        Behavior: Behavior normal.        Thought Content: Thought content normal.        Judgment: Judgment normal.  Vitals reviewed.    Assessment/Plan: Encounter for annual routine gynecological examination  Encounter for initial prescription of transdermal patch hormonal contraceptive device - Plan: norelgestromin-ethinyl estradiol [employer]ico) 150-35 MCG/24HR transdermal patch; BC options discussed with pros/cons. Pt would like to try xulane, Rx eRxd. Start with next menses, condoms for 1 wk. F/u prn. Should help with IDA.   Hot flashes--neg labs last fall, given age and normal menses, most likely due to stress. Add xulane anyway to see if any sx improvement. F/u prn.   Acne vulgaris--try xulane. If sx persist, pt to f/u with derm for adult acne tx. May also be related to stress.   Iron deficiency anemia due to chronic blood loss  PMDD (premenstrual dysphoric disorder)--try xulane. F/u prn.    Meds ordered this encounter  Medications   norelgestromin-ethinyl estradiol [employer]ico) 150-35 MCG/24HR transdermal patch    Sig: Place 1 patch onto the skin once a week. Apply 1 patch weekly for 3 weeks, then 1 week without patch    Dispense:  9 patch    Refill:  3    Supervising Provider:   Waymon Budge              GYN counsel adequate intake of calcium and vitamin D, diet and exercise     F/U  Return in about 1 year (around 07/16/2024).  Breeanne Oblinger B. Citlalic Norlander, PA-C 07/17/2023 2:10 PM

## 2023-07-17 ENCOUNTER — Ambulatory Visit (INDEPENDENT_AMBULATORY_CARE_PROVIDER_SITE_OTHER): Admitting: Obstetrics and Gynecology

## 2023-07-17 ENCOUNTER — Encounter: Payer: Self-pay | Admitting: Obstetrics and Gynecology

## 2023-07-17 VITALS — BP 96/65 | HR 93 | Ht 68.0 in | Wt 170.0 lb

## 2023-07-17 DIAGNOSIS — F3281 Premenstrual dysphoric disorder: Secondary | ICD-10-CM

## 2023-07-17 DIAGNOSIS — L700 Acne vulgaris: Secondary | ICD-10-CM

## 2023-07-17 DIAGNOSIS — Z01419 Encounter for gynecological examination (general) (routine) without abnormal findings: Secondary | ICD-10-CM | POA: Diagnosis not present

## 2023-07-17 DIAGNOSIS — Z3041 Encounter for surveillance of contraceptive pills: Secondary | ICD-10-CM

## 2023-07-17 DIAGNOSIS — D500 Iron deficiency anemia secondary to blood loss (chronic): Secondary | ICD-10-CM

## 2023-07-17 DIAGNOSIS — N841 Polyp of cervix uteri: Secondary | ICD-10-CM

## 2023-07-17 DIAGNOSIS — Z30016 Encounter for initial prescription of transdermal patch hormonal contraceptive device: Secondary | ICD-10-CM

## 2023-07-17 DIAGNOSIS — R232 Flushing: Secondary | ICD-10-CM

## 2023-07-17 MED ORDER — NORELGESTROMIN-ETH ESTRADIOL 150-35 MCG/24HR TD PTWK: 1.0000 | MEDICATED_PATCH | TRANSDERMAL | 3 refills | Status: AC

## 2023-07-17 NOTE — Patient Instructions (Signed)
 I value your feedback and you entrusting [place] with your care. If you get a [place] patient survey, I would appreciate you taking the time to let us know about your experience today. Thank you! ? ? ?

## 2023-08-25 ENCOUNTER — Other Ambulatory Visit: Payer: Self-pay | Admitting: Family Medicine

## 2023-08-25 DIAGNOSIS — F411 Generalized anxiety disorder: Secondary | ICD-10-CM

## 2023-08-25 DIAGNOSIS — F39 Unspecified mood [affective] disorder: Secondary | ICD-10-CM

## 2023-08-25 MED ORDER — DULOXETINE HCL 60 MG PO CPEP: ORAL_CAPSULE | ORAL | 0 refills | Status: DC

## 2023-08-25 NOTE — Telephone Encounter (Signed)
 Copied from CRM [id number]. Topic: Clinical - Medication Refill >> Aug 25, 2023  9:45 AM Clyde Darling P wrote: Medication: DULoxetine  (CYMBALTA ) 60 MG capsule  Has the patient contacted their pharmacy? Yes; pharmacy contacted us  on behalf of pt, nomore refills (Agent: If no, request that the patient contact the pharmacy for the refill. If patient does not wish to contact the pharmacy document the reason why and proceed with request.) (Agent: If yes, when and what did the pharmacy advise?)  This is the patient's preferred pharmacy:  [id number] - [place], [place] - [address] [facility] [address] [facility] [address] [place] [address] Phone: [phone] Fax: [phone]   Is this the correct pharmacy for this prescription? Yes If no, delete pharmacy and type the correct one.   Has the prescription been filled recently? No  Is the patient out of the medication? Yes  Has the patient been seen for an appointment in the last year OR does the patient have an upcoming appointment? Yes  Can we respond through MyChart? Yes  Agent: Please be advised that Rx refills may take up to 3 business days. We ask that you follow-up with your pharmacy.

## 2023-09-05 ENCOUNTER — Telehealth: Payer: Self-pay | Admitting: *Deleted

## 2023-09-05 ENCOUNTER — Encounter: Payer: Self-pay | Admitting: Oncology

## 2023-09-05 NOTE — Telephone Encounter (Signed)
 Patient is calling saying that she saw her results and she feels like she needs some iron  or something to make her feel better.  I called her back and asked her where she got the labs and she said it it was in [facility] and she had the results but we do not have it yet.  She is going to fax me the [facility] results. When I get the labs I will show it to Dr. Wilhelmenia Harada and the team will let the patient know if patient needs anything here at the cancer center

## 2023-09-05 NOTE — Telephone Encounter (Signed)
 Dr. Wilhelmenia Harada has reviewed labs. Ok to schedule MD/ Venofer - the end of next week (due to busy schedule).  Please inform pt of appt details.

## 2023-09-05 NOTE — Telephone Encounter (Signed)
 The patient called back dating that probably a year ago she missed one of her IV iron 's and she would like to know if she can get it now.  She is feeling so bad she wanted to have something as quick as it can be done.

## 2023-09-05 NOTE — Telephone Encounter (Signed)
 With the upcoming holiday, the schedule is pretty tight and she will need to see privider before receiving any venofer  since she has not had it in a while. Dr. Wilhelmenia Harada reviewed labs and hemoglobin is slightly decreased, next week should be ok. If she feels worse, she may need to go to urgent care for further eval.

## 2023-09-15 ENCOUNTER — Encounter: Payer: Self-pay | Admitting: Oncology

## 2023-09-15 ENCOUNTER — Inpatient Hospital Stay: Attending: Oncology | Admitting: Oncology

## 2023-09-15 ENCOUNTER — Inpatient Hospital Stay

## 2023-09-15 VITALS — BP 106/68 | HR 57 | Temp 98.1°F | Resp 18 | Wt 166.1 lb

## 2023-09-15 VITALS — BP 117/76 | HR 71 | Resp 18

## 2023-09-15 DIAGNOSIS — D509 Iron deficiency anemia, unspecified: Secondary | ICD-10-CM | POA: Insufficient documentation

## 2023-09-15 DIAGNOSIS — D500 Iron deficiency anemia secondary to blood loss (chronic): Secondary | ICD-10-CM | POA: Diagnosis not present

## 2023-09-15 DIAGNOSIS — N920 Excessive and frequent menstruation with regular cycle: Secondary | ICD-10-CM | POA: Insufficient documentation

## 2023-09-15 DIAGNOSIS — Z87891 Personal history of nicotine dependence: Secondary | ICD-10-CM | POA: Insufficient documentation

## 2023-09-15 DIAGNOSIS — Z9884 Bariatric surgery status: Secondary | ICD-10-CM | POA: Insufficient documentation

## 2023-09-15 MED ORDER — SODIUM CHLORIDE 0.9% FLUSH
10.0000 mL | Freq: Once | INTRAVENOUS | Status: AC | PRN
  Administered 2023-09-15: 10 mL
  Filled 2023-09-15: qty 10

## 2023-09-15 MED ORDER — IRON SUCROSE 20 MG/ML IV SOLN
200.0000 mg | Freq: Once | INTRAVENOUS | Status: AC
  Administered 2023-09-15: 200 mg via INTRAVENOUS
  Filled 2023-09-15: qty 10

## 2023-09-15 NOTE — Assessment & Plan Note (Addendum)
 Labs are reviewed and discussed with patient. Blood work results are consistent with IV Venofer  treatments. Recommend Venofer  weekly x 4

## 2023-09-15 NOTE — Assessment & Plan Note (Signed)
 Will check vitamin B12 periodically. Patient gets Venofer  treatments.

## 2023-09-15 NOTE — Assessment & Plan Note (Signed)
 Recommend patient to follow-up with gynecology

## 2023-09-15 NOTE — Progress Notes (Signed)
 Hematology/Oncology Consult Note [phone] [phone]     REFERRING PROVIDER: Valli Gaw, MD CHIEF COMPLAINTS/REASON FOR VISIT:  iron  deficiency anemia   ASSESSMENT & PLAN:   IDA (iron  deficiency anemia) Labs are reviewed and discussed with patient. Blood work results are consistent with IV Venofer  treatments. Recommend Venofer  weekly x 4  History of gastric bypass Will check vitamin B12 periodically. Patient gets Venofer  treatments.  Menorrhagia Recommend patient to follow-up with gynecology  [facility] Rx provided to patient.  Follow up in 4 months.   All questions were answered. The patient knows to call the clinic with any problems, questions or concerns.  Timmy Forbes, MD, PhD [facility] Hematology Oncology 09/15/2023   HISTORY OF PRESENTING ILLNESS:  Mary Fox is a  38 y.o.  female with PMH listed below was seen in consultation at the request of Valli Gaw, MD  for evaluation of iron  deficiency anemia.   Reviewed patient's recent labs  01/28/2019 labs revealed anemia with hemoglobin of 6.4, MCV 59, normal WBC, platelet count and differential. Iron  panel showed ferritin of 3, iron  saturation 3. Reviewed patient's previous labs ordered by primary care physician's office, anemia is chronic onset , duration is since at least 2018.  Associated signs and symptoms: Patient reports fatigue.  She also reports mild shortness of breath with exertion.  Occasionally she feels lightheaded. Denies weight loss, easy bruising, hematochezia, hemoptysis, hematuria. Context:  History of iron  deficiency: Yes recently diagnosed.  Started on oral slow FE tablets.  Also takes vitamin C tablets. Rectal bleeding: Occasionally. Menstrual bleeding/ Vaginal bleeding : Heavy menstrual bleeding. Hematemesis or hemoptysis : denies Blood in urine : denies  Last endoscopy: Fatigue: Yes.  History of gastric bypass surgery  Mary Fox is a 38 y.o. female who has above  history reviewed by me today presents to re-establish care for iron  deficiency anemia. Last seen by me on 12/11/2022  Iron  deficiency anemia due to heavy menstrual bleeding.  She has history of gastric bypass. She did not tolerate oral iron  supplementation.  Recently she feels very fatigued, affecting her daily activities.     Review of Systems  Constitutional:  Positive for fatigue. Negative for appetite change, chills and fever.  HENT:   Negative for hearing loss and voice change.   Eyes:  Negative for eye problems.  Respiratory:  Negative for chest tightness and cough.   Cardiovascular:  Negative for chest pain.  Gastrointestinal:  Negative for abdominal distention, abdominal pain and blood in stool.  Endocrine: Negative for hot flashes.  Genitourinary:  Negative for difficulty urinating and frequency.   Musculoskeletal:  Negative for arthralgias.  Skin:  Negative for itching and rash.  Neurological:  Negative for extremity weakness.  Hematological:  Negative for adenopathy.  Psychiatric/Behavioral:  Negative for confusion.     MEDICAL HISTORY:  Past Medical History:  Diagnosis Date   Allergic reaction 05/31/2017   Anxiety    COVID-19    09/09/21   Depression    Hiatal hernia 05/31/2019   IDA (iron  deficiency anemia) 02/01/2019   IDA (iron  deficiency anemia) 02/01/2019    SURGICAL HISTORY: Past Surgical History:  Procedure Laterality Date   BIOPSY  03/09/2023   Procedure: BIOPSY;  Surgeon: Selena Daily, MD;  Location: [facility] ENDOSCOPY;  Service: Gastroenterology;;   CESAREAN SECTION  09/05/2016   CHOLECYSTECTOMY  07 31 2012   COLONOSCOPY WITH PROPOFOL  N/A 03/09/2023   Procedure: COLONOSCOPY WITH PROPOFOL ;  Surgeon: Selena Daily, MD;  Location: [facility] ENDOSCOPY;  Service: Gastroenterology;  Laterality: N/A;   ESOPHAGOGASTRODUODENOSCOPY (EGD) WITH PROPOFOL  N/A 03/09/2023   Procedure: ESOPHAGOGASTRODUODENOSCOPY (EGD) WITH PROPOFOL ;  Surgeon: Selena Daily,  MD;  Location: [facility] ENDOSCOPY;  Service: Gastroenterology;  Laterality: N/A;   GASTRIC BYPASS      SOCIAL HISTORY: Social History   Socioeconomic History   Marital status: Married    Spouse name: benjamin   Number of children: 2   Years of education: Not on file   Highest education level: Master's degree (e.g., MA, MS, MEng, MEd, MSW, MBA)  Occupational History   Not on file  Tobacco Use   Smoking status: Former    Current packs/day: 0.00    Types: Cigarettes    Quit date: 11/05/2010    Years since quitting: 12.8   Smokeless tobacco: Never  Vaping Use   Vaping status: Never Used  Substance and Sexual Activity   Alcohol use: Not Currently    Alcohol/week: 2.0 standard drinks of alcohol    Types: 2 Glasses of wine per week   Drug use: No   Sexual activity: Yes    Birth control/protection: None  Other Topics Concern   Not on file  Social History Narrative   Not on file   Social Drivers of Health   Financial Resource Strain: Low Risk  (02/16/2023)   Overall Financial Resource Strain (CARDIA)    Difficulty of Paying Living Expenses: Not very hard  Food Insecurity: No Food Insecurity (02/16/2023)   Hunger Vital Sign    Worried About Running Out of Food in the Last Year: Never true    Ran Out of Food in the Last Year: Never true  Transportation Needs: No Transportation Needs (02/16/2023)   PRAPARE - Administrator, Civil Service (Medical): No    Lack of Transportation (Non-Medical): No  Physical Activity: Inactive (03/07/2019)   Exercise Vital Sign    Days of Exercise per Week: 0 days    Minutes of Exercise per Session: 0 min  Stress: Stress Concern Present (03/07/2019)   [employer] of Occupational Health - Occupational Stress Questionnaire    Feeling of Stress : Rather much  Social Connections: Unknown (03/07/2019)   Social Connection and Isolation Panel [NHANES]    Frequency of Communication with Friends and Family: Not on file    Frequency of  Social Gatherings with Friends and Family: Not on file    Attends Religious Services: Never    Active Member of Clubs or Organizations: No    Attends Banker Meetings: Never    Marital Status: Married  Catering manager Violence: Not At Risk (02/16/2023)   Humiliation, Afraid, Rape, and Kick questionnaire    Fear of Current or Ex-Partner: No    Emotionally Abused: No    Physically Abused: No    Sexually Abused: No    FAMILY HISTORY: Family History  Problem Relation Age of Onset   Alcohol abuse Mother    Anxiety disorder Mother    Depression Mother    Alcohol abuse Sister    Anxiety disorder Sister    Depression Sister    Diabetes Neg Hx     ALLERGIES:  has no known allergies.  MEDICATIONS:  Current Outpatient Medications  Medication Sig Dispense Refill   Biotin 40981 MCG TABS Take 10,000 mcg by mouth daily.     DULoxetine  (CYMBALTA ) 60 MG capsule TAKE 1 CAPSULE(60 MG) BY MOUTH DAILY 30 capsule 0   norelgestromin -ethinyl estradiol  (XULANE) 150-35 MCG/24HR transdermal patch Place 1 patch  onto the skin once a week. Apply 1 patch weekly for 3 weeks, then 1 week without patch 9 patch 3   No current facility-administered medications for this visit.     PHYSICAL EXAMINATION: ECOG PERFORMANCE STATUS: 0 - Asymptomatic Vitals:   09/15/23 1111  BP: 106/68  Pulse: (!) 57  Resp: 18  Temp: 98.1 F (36.7 C)   Filed Weights   09/15/23 1111  Weight: 166 lb 1.6 oz (75.3 kg)    Physical Exam Constitutional:      General: She is not in acute distress. HENT:     Head: Normocephalic and atraumatic.  Eyes:     General: No scleral icterus. Cardiovascular:     Rate and Rhythm: Normal rate and regular rhythm.     Heart sounds: Normal heart sounds.  Pulmonary:     Effort: Pulmonary effort is normal. No respiratory distress.     Breath sounds: No wheezing.  Abdominal:     General: Bowel sounds are normal. There is no distension.     Palpations: Abdomen is soft.   Musculoskeletal:        General: No deformity. Normal range of motion.     Cervical back: Normal range of motion and neck supple.  Skin:    General: Skin is warm and dry.     Findings: No rash.  Neurological:     Mental Status: She is alert and oriented to person, place, and time. Mental status is at baseline.     Cranial Nerves: No cranial nerve deficit.  Psychiatric:        Mood and Affect: Mood normal.           LABORATORY DATA:  I have reviewed the data as listed    Latest Ref Rng & Units 02/13/2023    8:49 AM 12/28/2022   10:08 AM 01/24/2022    3:30 PM  CBC  WBC 3.4 - 10.8 x10E3/uL 3.9  4.7  5.6   Hemoglobin 11.1 - 15.9 g/dL 81.1  91.4  78.2   Hematocrit 34.0 - 46.6 % 34.3  36.0  37.7   Platelets 150 - 450 x10E3/uL 361  244  263       Latest Ref Rng & Units 12/28/2022   10:08 AM 01/24/2022    3:30 PM 06/07/2021   12:02 PM  CMP  Glucose 70 - 99 mg/dL 63   58   BUN 6 - 20 mg/dL 8   9   Creatinine 9.56 - 1.00 mg/dL 2.13   0.86   Sodium 578 - 144 mmol/L 141   140   Potassium 3.5 - 5.2 mmol/L 4.2   4.7   Chloride 96 - 106 mmol/L 106   102   CO2 20 - 29 mmol/L 23   23   Calcium 8.7 - 10.2 mg/dL 9.0   8.7   Total Protein 6.0 - 8.5 g/dL 6.0  6.3  6.2   Total Bilirubin 0.0 - 1.2 mg/dL <4.6  <9.6  <2.9   Alkaline Phos 44 - 121 IU/L 88  101  88   AST 0 - 40 IU/L 21  21  17    ALT 0 - 32 IU/L 28  40  41      Iron /TIBC/Ferritin/ %Sat    Component Value Date/Time   IRON  26 (L) 02/13/2023 0849   TIBC 447 02/13/2023 0849   FERRITIN 5 (L) 02/13/2023 0849   IRONPCTSAT 6 (LL) 02/13/2023 0849    No results found.

## 2023-09-15 NOTE — Progress Notes (Signed)
 Pt here for follow up. Pt reports having extreme fatigue affecting daily activities

## 2023-09-18 ENCOUNTER — Inpatient Hospital Stay: Attending: Oncology

## 2023-09-18 VITALS — BP 109/76 | HR 75 | Temp 96.3°F | Resp 18

## 2023-09-18 DIAGNOSIS — D509 Iron deficiency anemia, unspecified: Secondary | ICD-10-CM | POA: Diagnosis present

## 2023-09-18 DIAGNOSIS — D500 Iron deficiency anemia secondary to blood loss (chronic): Secondary | ICD-10-CM

## 2023-09-18 MED ORDER — IRON SUCROSE 20 MG/ML IV SOLN
200.0000 mg | Freq: Once | INTRAVENOUS | Status: AC
  Administered 2023-09-18: 200 mg via INTRAVENOUS

## 2023-09-18 NOTE — Patient Instructions (Signed)

## 2023-09-25 ENCOUNTER — Inpatient Hospital Stay

## 2023-09-25 VITALS — BP 131/81 | HR 69 | Temp 96.8°F | Resp 18

## 2023-09-25 DIAGNOSIS — D509 Iron deficiency anemia, unspecified: Secondary | ICD-10-CM | POA: Diagnosis not present

## 2023-09-25 DIAGNOSIS — D500 Iron deficiency anemia secondary to blood loss (chronic): Secondary | ICD-10-CM

## 2023-09-25 MED ORDER — IRON SUCROSE 20 MG/ML IV SOLN
200.0000 mg | Freq: Once | INTRAVENOUS | Status: AC
  Administered 2023-09-25: 200 mg via INTRAVENOUS

## 2023-09-25 NOTE — Patient Instructions (Signed)
 [facility] - A DEPT OF [facility]  Discharge Instructions: Thank you for choosing [facility] to provide your oncology and hematology care.  If you have a lab appointment with the Cancer Center, please go directly to the Cancer Center and check in at the registration area.  Wear comfortable clothing and clothing appropriate for easy access to any Portacath or PICC line.   We strive to give you quality time with your provider. You may need to reschedule your appointment if you arrive late (15 or more minutes).  Arriving late affects you and other patients whose appointments are after yours.  Also, if you miss three or more appointments without notifying the office, you may be dismissed from the clinic at the provider's discretion.      For prescription refill requests, have your pharmacy contact our office and allow 72 hours for refills to be completed.    Today you received the following chemotherapy and/or immunotherapy agents venofer      To help prevent nausea and vomiting after your treatment, we encourage you to take your nausea medication as directed.  BELOW ARE SYMPTOMS THAT SHOULD BE REPORTED IMMEDIATELY: *FEVER GREATER THAN 100.4 F (38 C) OR HIGHER *CHILLS OR SWEATING *NAUSEA AND VOMITING THAT IS NOT CONTROLLED WITH YOUR NAUSEA MEDICATION *UNUSUAL SHORTNESS OF BREATH *UNUSUAL BRUISING OR BLEEDING *URINARY PROBLEMS (pain or burning when urinating, or frequent urination) *BOWEL PROBLEMS (unusual diarrhea, constipation, pain near the anus) TENDERNESS IN MOUTH AND THROAT WITH OR WITHOUT PRESENCE OF ULCERS (sore throat, sores in mouth, or a toothache) UNUSUAL RASH, SWELLING OR PAIN  UNUSUAL VAGINAL DISCHARGE OR ITCHING   Items with * indicate a potential emergency and should be followed up as soon as possible or go to the Emergency Department if any problems should occur.  Please show the CHEMOTHERAPY ALERT CARD or IMMUNOTHERAPY  ALERT CARD at check-in to the Emergency Department and triage nurse.  Should you have questions after your visit or need to cancel or reschedule your appointment, please contact [facility] - A DEPT OF [facility]  [phone] and follow the prompts.  Office hours are 8:00 a.m. to 4:30 p.m. Monday - Friday. Please note that voicemails left after 4:00 p.m. may not be returned until the following business day.  We are closed weekends and major holidays. You have access to a nurse at all times for urgent questions. Please call the main number to the clinic [phone] and follow the prompts.  For any non-urgent questions, you may also contact your provider using MyChart. We now offer e-Visits for anyone 3 and older to request care online for non-urgent symptoms. For details visit [email or web address].   Also download the MyChart app! Go to the app store, search "MyChart", open the app, select [facility], and log in with your MyChart username and password.

## 2023-09-28 ENCOUNTER — Ambulatory Visit: Admitting: Nurse Practitioner

## 2023-09-28 ENCOUNTER — Encounter: Payer: Self-pay | Admitting: Nurse Practitioner

## 2023-09-28 VITALS — BP 110/70 | HR 75 | Temp 98.1°F | Ht 68.0 in | Wt 164.0 lb

## 2023-09-28 DIAGNOSIS — Z9884 Bariatric surgery status: Secondary | ICD-10-CM

## 2023-09-28 DIAGNOSIS — F330 Major depressive disorder, recurrent, mild: Secondary | ICD-10-CM

## 2023-09-28 DIAGNOSIS — F411 Generalized anxiety disorder: Secondary | ICD-10-CM

## 2023-09-28 DIAGNOSIS — Z131 Encounter for screening for diabetes mellitus: Secondary | ICD-10-CM

## 2023-09-28 DIAGNOSIS — R5383 Other fatigue: Secondary | ICD-10-CM | POA: Insufficient documentation

## 2023-09-28 DIAGNOSIS — D509 Iron deficiency anemia, unspecified: Secondary | ICD-10-CM | POA: Diagnosis not present

## 2023-09-28 DIAGNOSIS — Z13220 Encounter for screening for lipoid disorders: Secondary | ICD-10-CM

## 2023-09-28 NOTE — Patient Instructions (Signed)
 Nice to see you today I will be in touch with the labs once I have them Follow up with me in 3 months for your physical sooner if you need me

## 2023-09-28 NOTE — Progress Notes (Signed)
   Established Patient Office Visit  Subjective   Patient ID: Mary Fox, female    DOB: 1985-05-10  Age: 38 y.o. MRN: [id number]  Chief Complaint  Patient presents with   Transitions Of Care    Concerned about being tired all the time; want to discuss medication Cymbalta  and requesting labs   Anemia    HPI  TOC: last seen by by Hollis Lurie, MD on 03/31/2023 Last CPE: 12/28/2022  Obesity/gastric bypass:  IDA:  GAD/Mood disorder:    ROS    Objective:     There were no vitals taken for this visit.   Physical Exam   No results found for any visits on 09/28/23.    The ASCVD Risk score (Arnett DK, et al., 2019) failed to calculate for the following reasons:   The 2019 ASCVD risk score is only valid for ages 28 to 44    Assessment & Plan:   Problem List Items Addressed This Visit   None   No follow-ups on file.    Margarie Shay, NP

## 2023-09-28 NOTE — Assessment & Plan Note (Signed)
 Ambiguous in nature.  Patient is a normal weight.  Seems to be getting adequate sleep without snoring or waking up in the middle the night.  Epworth sleepiness scale was a 13 in office today if metabolically intact do at home sleep study.

## 2023-09-28 NOTE — Assessment & Plan Note (Signed)
 Currently maintained on duloxetine  60 mg daily.  PHQ-9 and GAD-7 administered in office.  Patient denies any HI/SI/AVH.  Continue medication as prescribed.

## 2023-09-28 NOTE — Assessment & Plan Note (Signed)
 Patient currently maintained on duloxetine  60 mg daily.  States she is tolerating medication well.  Patient denies HI/SI/AVH.

## 2023-09-28 NOTE — Assessment & Plan Note (Signed)
 Pending B12 and vitamin D  level today.

## 2023-09-28 NOTE — Progress Notes (Signed)
 Established Patient Office Visit  Subjective   Patient ID: Mary Fox, female    DOB: 02-01-1986  Age: 38 y.o. 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 Is followed  by oncology and will do 4 infusions weekly every 6 months. Currently doing a cycle. Most recent labs reviewed done 08/31/2023  GAD/Mood disorder: states that medication works well. States that she sleeps well. She does go to bed around 1030 and will get up at 630. Does not feel rested. Mentions that she is more irritable and her spouse has noticed the same   Fatigue: states that for the past 2 months she has felt fatigued. States that she thought it was iron  and saw her oncology. 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 Does snore in the past. Does not snore now. States that she has a sleep study approx 6 years ago that was normal. States that she is more irritable     09/28/2023   11:13 AM 03/31/2023    9:50 AM 12/28/2022    9:50 AM  PHQ9 SCORE ONLY  PHQ-9 Total Score 5 1 3        09/28/2023   11:13 AM 03/31/2023    9:50 AM 12/28/2022    9:50 AM 05/16/2022    2:12 PM  GAD 7 : Generalized Anxiety Score  Nervous, Anxious, on Edge 0 0 0 0  Control/stop worrying 0 0 0 0  Worry too much - different things 0 0 0 0  Trouble relaxing 0 0 0 0  Restless 0 0 0 0  Easily annoyed or irritable 2 1 0 0  Afraid - awful might happen 0 0 0 0  Total GAD 7 Score 2 1 0 0  Anxiety Difficulty Somewhat difficult Not difficult at all Not difficult at all Not difficult at all        Review of Systems  Constitutional:  Negative for chills and fever.  Respiratory:  Negative for shortness of breath.   Cardiovascular:  Negative for chest  pain.  Gastrointestinal:  Negative for abdominal pain, constipation and diarrhea.       Bm every 3 days   Neurological:  Positive for headaches (in the morning when wake. states will last to mid morning and go away with otc).  Psychiatric/Behavioral:  Negative for hallucinations and suicidal ideas.       Objective:     BP 110/70   Pulse 75   Temp 98.1 F (36.7 C)   Ht 5' 8 (1.727 m)   Wt 164 lb (74.4 kg)   SpO2 99%   BMI 24.94 kg/m  BP Readings from Last 3 Encounters:  09/28/23 110/70  09/25/23 131/81  09/18/23 109/76   Wt Readings from Last 3 Encounters:  09/28/23 164 lb (74.4 kg)  09/15/23 166 lb 1.6 oz (75.3 kg)  07/17/23 170 lb (77.1 kg)   SpO2 Readings from Last 3 Encounters:  09/28/23 99%  09/25/23 100%  09/18/23 100%      Physical Exam Vitals and nursing note reviewed.  Constitutional:      Appearance: Normal appearance.  HENT:     Right Ear:  Tympanic membrane, ear canal and external ear normal.     Left Ear: Tympanic membrane, ear canal and external ear normal.     Mouth/Throat:     Mouth: Mucous membranes are moist.     Pharynx: Oropharynx is clear.   Eyes:     Extraocular Movements: Extraocular movements intact.     Pupils: Pupils are equal, round, and reactive to light.    Cardiovascular:     Rate and Rhythm: Normal rate and regular rhythm.     Pulses: Normal pulses.     Heart sounds: Normal heart sounds.  Pulmonary:     Effort: Pulmonary effort is normal.     Breath sounds: Normal breath sounds.  Abdominal:     General: Bowel sounds are normal.   Musculoskeletal:     Right lower leg: No edema.     Left lower leg: No edema.  Lymphadenopathy:     Cervical: No cervical adenopathy.   Skin:    General: Skin is warm.   Neurological:     General: No focal deficit present.     Mental Status: She is alert.     Deep Tendon Reflexes:     Reflex Scores:      Bicep reflexes are 2+ on the right side and 2+ on the left side.      Patellar  reflexes are 2+ on the right side and 2+ on the left side.    Comments: Bilateral upper and lower extremity strength 5/5  Psychiatric:        Mood and Affect: Mood normal.        Behavior: Behavior normal.        Thought Content: Thought content normal.        Judgment: Judgment normal.      No results found for any visits on 09/28/23.    The ASCVD Risk score (Arnett DK, et al., 2019) failed to calculate for the following reasons:   The 2019 ASCVD risk score is only valid for ages 33 to 41    Assessment & Plan:   Problem List Items Addressed This Visit       Other   IDA (iron  deficiency anemia) (Chronic)   History of the same currently followed by oncology/hematology and getting infusions weekly x 4 every 6 months.  Currently in a cycle.  Continue taking medication as prescribed and follow-up with oncology/hematology as recommended      History of gastric bypass (Chronic)   Pending B12 and vitamin D  level today.      Relevant Orders   CBC   Vitamin B12   VITAMIN D  25 Hydroxy (Vit-D Deficiency, Fractures)   GAD (generalized anxiety disorder)   Currently maintained on duloxetine  60 mg daily.  PHQ-9 and GAD-7 administered in office.  Patient denies any HI/SI/AVH.  Continue medication as prescribed.      Relevant Orders   TSH   MDD (major depressive disorder), recurrent episode, mild (HCC)   Patient currently maintained on duloxetine  60 mg daily.  States she is tolerating medication well.  Patient denies HI/SI/AVH.      Relevant Orders   TSH   Fatigue - Primary   Ambiguous in nature.  Patient is a normal weight.  Seems to be getting adequate sleep without snoring or waking up in the middle the night.  Epworth sleepiness scale was a 13 in office today if metabolically intact do at home sleep study.      Relevant Orders   CBC  Comprehensive metabolic panel with GFR   Vitamin B12   VITAMIN D  25 Hydroxy (Vit-D Deficiency, Fractures)   TSH   Other Visit Diagnoses        Screening for lipid disorders       Relevant Orders   Lipid panel     Screening for diabetes mellitus       Relevant Orders   Hemoglobin A1c       Return in about 3 months (around 12/29/2023) for CPE and Labs.    Margarie Shay, NP

## 2023-09-28 NOTE — Assessment & Plan Note (Signed)
 History of the same currently followed by oncology/hematology and getting infusions weekly x 4 every 6 months.  Currently in a cycle.  Continue taking medication as prescribed and follow-up with oncology/hematology as recommended

## 2023-09-29 LAB — CBC
Hematocrit: 37.9 % (ref 34.0–46.6)
Hemoglobin: 11.9 g/dL (ref 11.1–15.9)
MCH: 26.3 pg — ABNORMAL LOW (ref 26.6–33.0)
MCHC: 31.4 g/dL — ABNORMAL LOW (ref 31.5–35.7)
MCV: 84 fL (ref 79–97)
Platelets: 291 10*3/uL (ref 150–450)
RBC: 4.53 x10E6/uL (ref 3.77–5.28)
RDW: 14.6 % (ref 11.7–15.4)
WBC: 4.2 10*3/uL (ref 3.4–10.8)

## 2023-09-29 LAB — VITAMIN B12: Vitamin B-12: 355 pg/mL (ref 232–1245)

## 2023-09-29 LAB — COMPREHENSIVE METABOLIC PANEL WITH GFR
ALT: 20 IU/L (ref 0–32)
AST: 21 IU/L (ref 0–40)
Albumin: 4 g/dL (ref 3.9–4.9)
Alkaline Phosphatase: 84 IU/L (ref 44–121)
BUN/Creatinine Ratio: 14 (ref 9–23)
BUN: 8 mg/dL (ref 6–20)
Bilirubin Total: 0.3 mg/dL (ref 0.0–1.2)
CO2: 23 mmol/L (ref 20–29)
Calcium: 8.9 mg/dL (ref 8.7–10.2)
Chloride: 102 mmol/L (ref 96–106)
Creatinine, Ser: 0.59 mg/dL (ref 0.57–1.00)
Globulin, Total: 2.3 g/dL (ref 1.5–4.5)
Glucose: 71 mg/dL (ref 70–99)
Potassium: 4.3 mmol/L (ref 3.5–5.2)
Sodium: 138 mmol/L (ref 134–144)
Total Protein: 6.3 g/dL (ref 6.0–8.5)
eGFR: 117 mL/min/{1.73_m2} (ref >59)

## 2023-09-29 LAB — HEMOGLOBIN A1C
Est. average glucose Bld gHb Est-mCnc: 94 mg/dL
Hgb A1c MFr Bld: 4.9 % (ref 4.8–5.6)

## 2023-09-29 LAB — LIPID PANEL
Chol/HDL Ratio: 1.9 ratio (ref 0.0–4.4)
Cholesterol, Total: 151 mg/dL (ref 100–199)
HDL: 79 mg/dL (ref >39)
LDL Chol Calc (NIH): 59 mg/dL (ref 0–99)
Triglycerides: 62 mg/dL (ref 0–149)
VLDL Cholesterol Cal: 13 mg/dL (ref 5–40)

## 2023-09-29 LAB — TSH: TSH: 1.53 u[IU]/mL (ref 0.450–4.500)

## 2023-09-29 LAB — VITAMIN D 25 HYDROXY (VIT D DEFICIENCY, FRACTURES): Vit D, 25-Hydroxy: 51.4 ng/mL (ref 30.0–100.0)

## 2023-10-02 ENCOUNTER — Ambulatory Visit: Payer: Self-pay | Admitting: Nurse Practitioner

## 2023-10-02 ENCOUNTER — Inpatient Hospital Stay

## 2023-10-02 ENCOUNTER — Other Ambulatory Visit: Payer: Self-pay | Admitting: Nurse Practitioner

## 2023-10-02 VITALS — BP 109/70 | HR 82 | Temp 97.2°F | Resp 18

## 2023-10-02 DIAGNOSIS — R5383 Other fatigue: Secondary | ICD-10-CM

## 2023-10-02 DIAGNOSIS — Z1231 Encounter for screening mammogram for malignant neoplasm of breast: Secondary | ICD-10-CM

## 2023-10-02 DIAGNOSIS — D509 Iron deficiency anemia, unspecified: Secondary | ICD-10-CM | POA: Diagnosis not present

## 2023-10-02 DIAGNOSIS — D500 Iron deficiency anemia secondary to blood loss (chronic): Secondary | ICD-10-CM

## 2023-10-02 MED ORDER — IRON SUCROSE 20 MG/ML IV SOLN
200.0000 mg | Freq: Once | INTRAVENOUS | Status: AC
  Administered 2023-10-02: 200 mg via INTRAVENOUS

## 2023-10-02 NOTE — Patient Instructions (Signed)

## 2023-10-09 ENCOUNTER — Encounter

## 2023-10-09 DIAGNOSIS — Z1231 Encounter for screening mammogram for malignant neoplasm of breast: Secondary | ICD-10-CM

## 2023-11-20 ENCOUNTER — Encounter: Admitting: Nurse Practitioner

## 2023-12-27 ENCOUNTER — Other Ambulatory Visit: Payer: Self-pay | Admitting: Nurse Practitioner

## 2023-12-27 DIAGNOSIS — Z1231 Encounter for screening mammogram for malignant neoplasm of breast: Secondary | ICD-10-CM

## 2024-01-03 ENCOUNTER — Ambulatory Visit
Admission: RE | Admit: 2024-01-03 | Discharge: 2024-01-03 | Disposition: A | Discharge status: Home / Self Care | Source: Ambulatory Visit

## 2024-01-03 DIAGNOSIS — Z1231 Encounter for screening mammogram for malignant neoplasm of breast: Secondary | ICD-10-CM

## 2024-01-05 ENCOUNTER — Ambulatory Visit: Payer: Self-pay | Admitting: Nurse Practitioner

## 2024-01-09 ENCOUNTER — Encounter: Payer: Self-pay | Admitting: Oncology

## 2024-01-10 ENCOUNTER — Encounter: Payer: Self-pay | Admitting: Nurse Practitioner

## 2024-01-15 ENCOUNTER — Inpatient Hospital Stay

## 2024-01-15 ENCOUNTER — Inpatient Hospital Stay: Attending: Oncology | Admitting: Oncology

## 2024-01-15 ENCOUNTER — Encounter: Payer: Self-pay | Admitting: Oncology

## 2024-01-15 VITALS — BP 120/82 | HR 73 | Temp 96.0°F | Resp 18 | Wt 157.9 lb

## 2024-01-15 VITALS — BP 107/80 | HR 78

## 2024-01-15 DIAGNOSIS — Z9884 Bariatric surgery status: Secondary | ICD-10-CM | POA: Insufficient documentation

## 2024-01-15 DIAGNOSIS — Z87891 Personal history of nicotine dependence: Secondary | ICD-10-CM | POA: Diagnosis not present

## 2024-01-15 DIAGNOSIS — E538 Deficiency of other specified B group vitamins: Secondary | ICD-10-CM | POA: Insufficient documentation

## 2024-01-15 DIAGNOSIS — D509 Iron deficiency anemia, unspecified: Secondary | ICD-10-CM | POA: Insufficient documentation

## 2024-01-15 DIAGNOSIS — D500 Iron deficiency anemia secondary to blood loss (chronic): Secondary | ICD-10-CM | POA: Diagnosis not present

## 2024-01-15 MED ORDER — CYANOCOBALAMIN 1000 MCG/ML IJ SOLN
1000.0000 ug | Freq: Once | INTRAMUSCULAR | Status: AC
  Administered 2024-01-15: 1000 ug via INTRAMUSCULAR

## 2024-01-15 MED ORDER — IRON SUCROSE 20 MG/ML IV SOLN
200.0000 mg | Freq: Once | INTRAVENOUS | Status: AC
  Administered 2024-01-15: 200 mg via INTRAVENOUS
  Filled 2024-01-15: qty 10

## 2024-01-15 MED ORDER — SODIUM CHLORIDE 0.9% FLUSH
10.0000 mL | Freq: Once | INTRAVENOUS | Status: DC | PRN
  Filled 2024-01-15: qty 10

## 2024-01-15 NOTE — Assessment & Plan Note (Addendum)
 B12 level is at a low normal end-375 discussed about option of sublingual B12 versus B12 injections.  Patient elected to proceed with B12 1000 mcg every 3 months.

## 2024-01-15 NOTE — Assessment & Plan Note (Addendum)
 [facility] labs are reviewed and discussed with patient. Normal hemoglobin and ferritin is 20.  Patient has history of gastric bypass.  Recommend 1 dose of Venofer  200 mg as maintenance

## 2024-01-15 NOTE — Progress Notes (Signed)
 Hematology/Oncology Progress note [phone] [phone]       REFERRING PROVIDER: Wendee Lynwood HERO, NP CHIEF COMPLAINTS/REASON FOR VISIT:  iron  deficiency anemia   ASSESSMENT & PLAN:   IDA (iron  deficiency anemia) [facility] labs are reviewed and discussed with patient. Normal hemoglobin and ferritin is 20.  Patient has history of gastric bypass.  Recommend 1 dose of Venofer  200 mg as maintenance   History of gastric bypass B12 level is at a low normal end-375 discussed about option of sublingual B12 versus B12 injections.  Patient elected to proceed with B12 1000 mcg every 3 months.   [facility] Rx provided to patient.  Follow up in 4 months.   All questions were answered. The patient knows to call the clinic with any problems, questions or concerns.  Zelphia Cap, MD, PhD [facility] Hematology Oncology 01/15/2024   HISTORY OF PRESENTING ILLNESS:  Mary Fox is a  38 y.o.  female with PMH listed below was seen in consultation at the request of Wendee Lynwood HERO, NP  for evaluation of iron  deficiency anemia.   Reviewed patient's recent labs  01/28/2019 labs revealed anemia with hemoglobin of 6.4, MCV 59, normal WBC, platelet count and differential. Iron  panel showed ferritin of 3, iron  saturation 3. Reviewed patient's previous labs ordered by primary care physician's office, anemia is chronic onset , duration is since at least 2018.  Associated signs and symptoms: Patient reports fatigue.  She also reports mild shortness of breath with exertion.  Occasionally she feels lightheaded. Denies weight loss, easy bruising, hematochezia, hemoptysis, hematuria. Context:  History of iron  deficiency: Yes recently diagnosed.  Started on oral slow FE tablets.  Also takes vitamin C tablets. Rectal bleeding: Occasionally. Menstrual bleeding/ Vaginal bleeding : Heavy menstrual bleeding. Hematemesis or hemoptysis : denies Blood in urine : denies  Last endoscopy: Fatigue:  Yes.  History of gastric bypass surgery  Mary Fox is a 38 y.o. female who has above history reviewed by me today presents to follow-up for iron  deficiency anemia, history of bypass.  History iron  deficiency anemia due to heavy menstrual bleeding.  She has history of gastric bypass. She did not tolerate oral iron  supplementation.    Review of Systems  Constitutional:  Positive for fatigue. Negative for appetite change, chills and fever.  HENT:   Negative for hearing loss and voice change.   Eyes:  Negative for eye problems.  Respiratory:  Negative for chest tightness and cough.   Cardiovascular:  Negative for chest pain.  Gastrointestinal:  Negative for abdominal distention, abdominal pain and blood in stool.  Endocrine: Negative for hot flashes.  Genitourinary:  Negative for difficulty urinating and frequency.   Musculoskeletal:  Negative for arthralgias.  Skin:  Negative for itching and rash.  Neurological:  Negative for extremity weakness.  Hematological:  Negative for adenopathy.  Psychiatric/Behavioral:  Negative for confusion.     MEDICAL HISTORY:  Past Medical History:  Diagnosis Date   Allergic reaction 05/31/2017   Anxiety    COVID-19    09/09/21   Depression    Hiatal hernia 05/31/2019   IDA (iron  deficiency anemia) 02/01/2019   IDA (iron  deficiency anemia) 02/01/2019    SURGICAL HISTORY: Past Surgical History:  Procedure Laterality Date   BIOPSY  03/09/2023   Procedure: BIOPSY;  Surgeon: Unk Corinn Skiff, MD;  Location: [facility] ENDOSCOPY;  Service: Gastroenterology;;   CESAREAN SECTION  09/05/2016   CHOLECYSTECTOMY  07 31 2012   COLONOSCOPY WITH PROPOFOL  N/A 03/09/2023   Procedure:  COLONOSCOPY WITH PROPOFOL ;  Surgeon: Unk Corinn Skiff, MD;  Location[facility] ENDOSCOPY;  Service: Gastroenterology;  Laterality: N/A;   ESOPHAGOGASTRODUODENOSCOPY (EGD) WITH PROPOFOL  N/A 03/09/2023   Procedure: ESOPHAGOGASTRODUODENOSCOPY (EGD) WITH PROPOFOL ;  Surgeon: Unk Corinn Skiff, MD;  Location: [facility] ENDOSCOPY;  Service: Gastroenterology;  Laterality: N/A;   GASTRIC BYPASS      SOCIAL HISTORY: Social History   Socioeconomic History   Marital status: Married    Spouse name: benjamin   Number of children: 2   Years of education: Not on file   Highest education level: Master's degree (e.g., MA, MS, MEng, MEd, MSW, MBA)  Occupational History   Not on file  Tobacco Use   Smoking status: Former    Current packs/day: 0.00    Types: Cigarettes    Quit date: 11/05/2010    Years since quitting: 13.2   Smokeless tobacco: Never  Vaping Use   Vaping status: Never Used  Substance and Sexual Activity   Alcohol use: Not Currently    Alcohol/week: 2.0 standard drinks of alcohol    Types: 2 Glasses of wine per week   Drug use: No   Sexual activity: Yes    Birth control/protection: None  Other Topics Concern   Not on file  Social History Narrative   Fulltime: [facility]      Dorn (7) twins    Valery (7)   Social Drivers of Corporate investment banker Strain: Low Risk  (02/16/2023)   Overall Financial Resource Strain (CARDIA)    Difficulty of Paying Living Expenses: Not very hard  Food Insecurity: No Food Insecurity (02/16/2023)   Hunger Vital Sign    Worried About Running Out of Food in the Last Year: Never true    Ran Out of Food in the Last Year: Never true  Transportation Needs: No Transportation Needs (02/16/2023)   PRAPARE - Administrator, Civil Service (Medical): No    Lack of Transportation (Non-Medical): No  Physical Activity: Inactive (03/07/2019)   Exercise Vital Sign    Days of Exercise per Week: 0 days    Minutes of Exercise per Session: 0 min  Stress: Stress Concern Present (03/07/2019)   [employer] of Occupational Health - Occupational Stress Questionnaire    Feeling of Stress : Rather much  Social Connections: Unknown (03/07/2019)   Social Connection and Isolation Panel    Frequency of Communication with  Friends and Family: Not on file    Frequency of Social Gatherings with Friends and Family: Not on file    Attends Religious Services: Never    Active Member of Clubs or Organizations: No    Attends Banker Meetings: Never    Marital Status: Married  Catering manager Violence: Not At Risk (02/16/2023)   Humiliation, Afraid, Rape, and Kick questionnaire    Fear of Current or Ex-Partner: No    Emotionally Abused: No    Physically Abused: No    Sexually Abused: No    FAMILY HISTORY: Family History  Problem Relation Age of Onset   Alcohol abuse Mother    Anxiety disorder Mother    Depression Mother    Alcohol abuse Sister    Anxiety disorder Sister    Depression Sister    Diabetes Neg Hx     ALLERGIES:  has no known allergies.  MEDICATIONS:  Current Outpatient Medications  Medication Sig Dispense Refill   Biotin 89999 MCG TABS Take 10,000 mcg by mouth daily.     DULoxetine  (CYMBALTA )  60 MG capsule TAKE 1 CAPSULE(60 MG) BY MOUTH DAILY 30 capsule 0   norelgestromin -ethinyl estradiol  (XULANE) 150-35 MCG/24HR transdermal patch Place 1 patch onto the skin once a week. Apply 1 patch weekly for 3 weeks, then 1 week without patch 9 patch 3   No current facility-administered medications for this visit.     PHYSICAL EXAMINATION: ECOG PERFORMANCE STATUS: 0 - Asymptomatic Vitals:   01/15/24 1037  BP: 120/82  Pulse: 73  Resp: 18  Temp: (!) 96 F (35.6 C)  SpO2: 98%   Filed Weights   01/15/24 1037  Weight: 157 lb 14.4 oz (71.6 kg)    Physical Exam Constitutional:      General: She is not in acute distress. HENT:     Head: Normocephalic and atraumatic.  Eyes:     General: No scleral icterus. Cardiovascular:     Rate and Rhythm: Normal rate and regular rhythm.     Heart sounds: Normal heart sounds.  Pulmonary:     Effort: Pulmonary effort is normal. No respiratory distress.     Breath sounds: No wheezing.  Abdominal:     General: Bowel sounds are normal.  There is no distension.     Palpations: Abdomen is soft.  Musculoskeletal:        General: No deformity. Normal range of motion.     Cervical back: Normal range of motion and neck supple.  Skin:    General: Skin is warm and dry.     Findings: No rash.  Neurological:     Mental Status: She is alert and oriented to person, place, and time. Mental status is at baseline.  Psychiatric:        Mood and Affect: Mood normal.           LABORATORY DATA:  I have reviewed the data as listed    Latest Ref Rng & Units 09/28/2023   11:33 AM 02/13/2023    8:49 AM 12/28/2022   10:08 AM  CBC  WBC 3.4 - 10.8 x10E3/uL 4.2  3.9  4.7   Hemoglobin 11.1 - 15.9 g/dL 88.0  89.1  88.6   Hematocrit 34.0 - 46.6 % 37.9  34.3  36.0   Platelets 150 - 450 x10E3/uL 291  361  244       Latest Ref Rng & Units 09/28/2023   11:33 AM 12/28/2022   10:08 AM 01/24/2022    3:30 PM  CMP  Glucose 70 - 99 mg/dL 71  63    BUN 6 - 20 mg/dL 8  8    Creatinine 9.42 - 1.00 mg/dL 9.40  9.35    Sodium 865 - 144 mmol/L 138  141    Potassium 3.5 - 5.2 mmol/L 4.3  4.2    Chloride 96 - 106 mmol/L 102  106    CO2 20 - 29 mmol/L 23  23    Calcium 8.7 - 10.2 mg/dL 8.9  9.0    Total Protein 6.0 - 8.5 g/dL 6.3  6.0  6.3   Total Bilirubin 0.0 - 1.2 mg/dL 0.3  <9.7  <9.7   Alkaline Phos 44 - 121 IU/L 84  88  101   AST 0 - 40 IU/L 21  21  21    ALT 0 - 32 IU/L 20  28  40      Iron /TIBC/Ferritin/ %Sat    Component Value Date/Time   IRON  26 (L) 02/13/2023 0849   TIBC 447 02/13/2023 0849   FERRITIN 5 (L) 02/13/2023  0849   IRONPCTSAT 6 (LL) 02/13/2023 0849    MM 3D SCREENING MAMMOGRAM BILATERAL BREAST Result Date: 01/05/2024 CLINICAL DATA:  Screening. EXAM: DIGITAL SCREENING BILATERAL MAMMOGRAM WITH TOMOSYNTHESIS AND CAD TECHNIQUE: Bilateral screening digital craniocaudal and mediolateral oblique mammograms were obtained. Bilateral screening digital breast tomosynthesis was performed. The images were evaluated with  computer-aided detection. COMPARISON:  None available. ACR Breast Density Category c: The breasts are heterogeneously dense, which may obscure small masses. FINDINGS: There are no findings suspicious for malignancy. IMPRESSION: No mammographic evidence of malignancy. A result letter of this screening mammogram will be mailed directly to the patient. RECOMMENDATION: Screening mammogram in one year. (Code:SM-B-01Y) BI-RADS CATEGORY  1: Negative. Electronically Signed   By: Curtistine Noble   On: 01/05/2024 12:58

## 2024-01-18 ENCOUNTER — Other Ambulatory Visit: Payer: Self-pay | Admitting: Otolaryngology

## 2024-02-05 ENCOUNTER — Ambulatory Visit: Payer: Self-pay

## 2024-02-05 ENCOUNTER — Ambulatory Visit (INDEPENDENT_AMBULATORY_CARE_PROVIDER_SITE_OTHER): Admitting: Nurse Practitioner

## 2024-02-05 ENCOUNTER — Encounter: Payer: Self-pay | Admitting: Nurse Practitioner

## 2024-02-05 VITALS — BP 106/62 | HR 73 | Temp 98.8°F | Ht 68.0 in | Wt 167.0 lb

## 2024-02-05 DIAGNOSIS — L237 Allergic contact dermatitis due to plants, except food: Secondary | ICD-10-CM | POA: Diagnosis not present

## 2024-02-05 MED ORDER — PREDNISONE 10 MG PO TABS: ORAL_TABLET | ORAL | 0 refills | Status: DC

## 2024-02-05 NOTE — Telephone Encounter (Signed)
 Noted

## 2024-02-05 NOTE — Progress Notes (Signed)
 BP 106/62   Pulse 73   Temp 98.8 F (37.1 C)   Ht 5' 8 (1.727 m)   Wt 167 lb (75.8 kg)   SpO2 98%   BMI 25.39 kg/m    Subjective:    Patient ID: Mary Fox, female    DOB: 1986/02/10, 38 y.o.   MRN: [id number]  HPI: Mary Fox is a 38 y.o. female  Chief Complaint  Patient presents with   Rash    Pt c/o rash x10 days. Took last dose of prednisone  yesterday but rash is still spreading.    Discussed the use of AI scribe software for clinical note transcription with the patient, who gave verbal consent to proceed.  History of Present Illness Mary Fox is a 38 year old female who presents with a rash and itching after gardening.  Pruritic rash - Onset January 27, 2024, after gardening - Initial appearance: bright, large rash on arms - Progression: spread to neck, chest, and stomach - Intense and persistent pruritus - Rash has lightened with treatment but remains visible and continues to itch - No fever or respiratory symptoms - No use of allergy medications  Response to treatment - Received a five-day prednisone  step-down pack prescribed via MD live service - Partial improvement: rash has lightened but persists and continues to spread - Symptoms have not resolved despite initial corticosteroid therapy         09/28/2023   11:13 AM 03/31/2023    9:50 AM 12/28/2022    9:50 AM  Depression screen PHQ 2/9  Decreased Interest 0 0 0  Down, Depressed, Hopeless 0 0 0  PHQ - 2 Score 0 0 0  Altered sleeping 1 0 1  Tired, decreased energy 3 1 2   Change in appetite 1 0 0  Feeling bad or failure about yourself  0 0 0  Trouble concentrating 0 0 0  Moving slowly or fidgety/restless 0 0 0  Suicidal thoughts 0 0 0  PHQ-9 Score 5 1 3   Difficult doing work/chores Very difficult Not difficult at all Somewhat difficult    Relevant past medical, surgical, family and social history reviewed and updated as indicated. Interim medical history since our last visit reviewed. Allergies  and medications reviewed and updated.  Review of Systems  Ten systems reviewed and is negative except as mentioned in HPI      Objective:      BP 106/62   Pulse 73   Temp 98.8 F (37.1 C)   Ht 5' 8 (1.727 m)   Wt 167 lb (75.8 kg)   SpO2 98%   BMI 25.39 kg/m    Wt Readings from Last 3 Encounters:  02/05/24 167 lb (75.8 kg)  01/15/24 157 lb 14.4 oz (71.6 kg)  09/28/23 164 lb (74.4 kg)    Physical Exam GENERAL: Alert, cooperative, well developed, no acute distress HEENT: Normocephalic, normal oropharynx, moist mucous membranes CHEST: Clear to auscultation bilaterally, No wheezes, rhonchi, or crackles CARDIOVASCULAR: Normal heart rate and rhythm, S1 and S2 normal without murmurs ABDOMEN: Soft, non-tender, non-distended, without organomegaly, Normal bowel sounds EXTREMITIES: No cyanosis or edema Skin: poison ivy on arms, legs, neck, chest NEUROLOGICAL: Cranial nerves grossly intact, Moves all extremities without gross motor or sensory deficit  Results for orders placed or performed in visit on 09/28/23  CBC   Collection Time: 09/28/23 11:33 AM  Result Value Ref Range   WBC 4.2 3.4 - 10.8 x10E3/uL   RBC 4.53 3.77 - 5.28 x10E6/uL  Hemoglobin 11.9 11.1 - 15.9 g/dL   Hematocrit 62.0 65.9 - 46.6 %   MCV 84 79 - 97 fL   MCH 26.3 (L) 26.6 - 33.0 pg   MCHC 31.4 (L) 31.5 - 35.7 g/dL   RDW 85.3 88.2 - 84.5 %   Platelets 291 150 - 450 x10E3/uL  Comprehensive metabolic panel with GFR   Collection Time: 09/28/23 11:33 AM  Result Value Ref Range   Glucose 71 70 - 99 mg/dL   BUN 8 6 - 20 mg/dL   Creatinine, Ser 9.40 0.57 - 1.00 mg/dL   eGFR 882 >40 fO/fpw/8.26   BUN/Creatinine Ratio 14 9 - 23   Sodium 138 134 - 144 mmol/L   Potassium 4.3 3.5 - 5.2 mmol/L   Chloride 102 96 - 106 mmol/L   CO2 23 20 - 29 mmol/L   Calcium 8.9 8.7 - 10.2 mg/dL   Total Protein 6.3 6.0 - 8.5 g/dL   Albumin 4.0 3.9 - 4.9 g/dL   Globulin, Total 2.3 1.5 - 4.5 g/dL   Bilirubin Total 0.3 0.0 - 1.2  mg/dL   Alkaline Phosphatase 84 44 - 121 IU/L   AST 21 0 - 40 IU/L   ALT 20 0 - 32 IU/L  Vitamin B12   Collection Time: 09/28/23 11:33 AM  Result Value Ref Range   Vitamin B-12 355 232 - 1,245 pg/mL  VITAMIN D  25 Hydroxy (Vit-D Deficiency, Fractures)   Collection Time: 09/28/23 11:33 AM  Result Value Ref Range   Vit D, 25-Hydroxy 51.4 30.0 - 100.0 ng/mL  TSH   Collection Time: 09/28/23 11:33 AM  Result Value Ref Range   TSH 1.530 0.450 - 4.500 uIU/mL  Lipid panel   Collection Time: 09/28/23 11:33 AM  Result Value Ref Range   Cholesterol, Total 151 100 - 199 mg/dL   Triglycerides 62 0 - 149 mg/dL   HDL 79 >60 mg/dL   VLDL Cholesterol Cal 13 5 - 40 mg/dL   LDL Chol Calc (NIH) 59 0 - 99 mg/dL   Chol/HDL Ratio 1.9 0.0 - 4.4 ratio  Hemoglobin A1c   Collection Time: 09/28/23 11:33 AM  Result Value Ref Range   Hgb A1c MFr Bld 4.9 4.8 - 5.6 %   Est. average glucose Bld gHb Est-mCnc 94 mg/dL          Assessment & Plan:   Problem List Items Addressed This Visit   None Visit Diagnoses       Poison ivy dermatitis    -  Primary   Relevant Medications   predniSONE  (DELTASONE ) 10 MG tablet        Assessment and Plan Assessment & Plan Rebound Allergic contact dermatitis due to plants Allergic contact dermatitis likely due to exposure to poison ivy or similar plant, presenting with itching and rash on the neck, chest, and stomach. Initial treatment with a short course of prednisone  resulted in rebound symptoms. No current use of allergy medications. - Initiated extended prednisone  taper: 50 mg daily for days 1-5, 30 mg daily for days 6-10, 20 mg daily for days 11-15, and 10 mg daily for days 16-20. - Recommended Zyrtec or Claritin for itching to avoid drowsiness. - Advised taking Pepcid in the morning and at night  - Provided clear written instructions for prednisone  taper.        Follow up plan: Return if symptoms worsen or fail to improve.

## 2024-02-05 NOTE — Telephone Encounter (Signed)
 FYI Only or Action Required?: FYI only for provider.  Patient was last seen in primary care on 09/28/2023 by Wendee Lynwood HERO, NP.  Called Nurse Triage reporting Poison Ivy. - getting worse  Symptoms began a week ago.  Interventions attempted: Rest, hydration, or home remedies and Other: Prednisone .  Symptoms are: gradually worsening.  Triage Disposition: See Physician Within 24 Hours  Patient/caregiver understands and will follow disposition?: Yes - Appt scheduled for today at [facility]                     [facility] could not transfer call - Called pt back    Copied from CRM #[id number]. Topic: Clinical - Red Word Triage >> Feb 05, 2024  8:41 AM Jasmin G wrote: Red Word that prompted transfer to Nurse Triage: Pt states that she currently has poison ivy and rash is spreading despite med. Reason for Disposition  MODERATE to SEVERE itching (e.g., interferes with work, school, sleep, or other activities)  Answer Assessment - Initial Assessment Questions 1. APPEARANCE of RASH: What does the rash look like?      Red itchy 2. LOCATION: Where is the rash located?  (e.g., face, genitals, hands, legs)     Arms and legs 3. SIZE: How large is the rash?      Moderate amount all over 4. ONSET: When did the rash begin?      1 week - last saturday 5. ITCHING: Does the rash itch? If Yes, ask: How bad is it?     itches 6. EXPOSURE:  How were you exposed to the plant (poison ivy, poison oak, sumac)  When were you exposed?  Note: Sometimes a poison ivy/oak/sumac rash does not appear for 2 to 3 weeks after exposure.      Poison IVy 7. PAST HISTORY: Have you had a poison ivy rash before? If Yes, ask: How bad was it?     no 8. OTHER SYMPTOMS: Do you have any other symptoms? (e.g., fever)      fever 9. PREGNANCY: Is there any chance you are pregnant? When was your last menstrual period?     no  Protocols used: Poison Ivy - Oak - [facility]

## 2024-02-08 ENCOUNTER — Encounter: Payer: Self-pay | Admitting: Nurse Practitioner

## 2024-02-13 ENCOUNTER — Encounter: Payer: Self-pay | Admitting: Otolaryngology

## 2024-02-14 ENCOUNTER — Encounter: Payer: Self-pay | Admitting: Anesthesiology

## 2024-02-15 ENCOUNTER — Other Ambulatory Visit: Payer: Self-pay | Admitting: Nurse Practitioner

## 2024-02-15 DIAGNOSIS — F411 Generalized anxiety disorder: Secondary | ICD-10-CM

## 2024-02-15 DIAGNOSIS — F39 Unspecified mood [affective] disorder: Secondary | ICD-10-CM

## 2024-02-15 NOTE — Telephone Encounter (Unsigned)
 Copied from CRM [id number]. Topic: Clinical - Medication Refill >> Feb 15, 2024  1:12 PM Tal R wrote: Medication:  DULoxetine  (CYMBALTA ) 60 MG capsule   Has the patient contacted their pharmacy? Yes, pharmacy contacting on behalf   This is the patient's preferred pharmacy:  [id number] - [place], [place] - [address] AT [facility] [address] [facility] [address] [facility] [address] [place] [address] Phone: [phone] Fax: [phone]   Is this the correct pharmacy for this prescription? Yes   Has the prescription been filled recently? Yes  Is the patient out of the medication? Yes  Has the patient been seen for an appointment in the last year OR does the patient have an upcoming appointment? Yes   Agent: Please be advised that Rx refills may take up to 3 business days. We ask that you follow-up with your pharmacy.

## 2024-02-16 MED ORDER — DULOXETINE HCL 60 MG PO CPEP: ORAL_CAPSULE | ORAL | 0 refills | Status: DC

## 2024-02-20 HISTORY — DX: Personal history of other diseases of the digestive system: Z87.19

## 2024-02-20 HISTORY — DX: Gastro-esophageal reflux disease without esophagitis: K21.9

## 2024-02-23 ENCOUNTER — Encounter: Payer: Self-pay | Admitting: Nurse Practitioner

## 2024-02-23 DIAGNOSIS — F39 Unspecified mood [affective] disorder: Secondary | ICD-10-CM

## 2024-02-23 DIAGNOSIS — F411 Generalized anxiety disorder: Secondary | ICD-10-CM

## 2024-04-15 ENCOUNTER — Inpatient Hospital Stay: Attending: Nurse Practitioner

## 2024-04-16 ENCOUNTER — Ambulatory Visit: Admission: RE | Admit: 2024-04-16 | Source: Home / Self Care | Admitting: Otolaryngology

## 2024-04-16 HISTORY — DX: Gastro-esophageal reflux disease without esophagitis: K21.9

## 2024-04-16 HISTORY — DX: Personal history of other diseases of the digestive system: Z87.19

## 2024-04-23 ENCOUNTER — Encounter: Payer: Self-pay | Admitting: Oncology

## 2024-04-25 ENCOUNTER — Ambulatory Visit (INDEPENDENT_AMBULATORY_CARE_PROVIDER_SITE_OTHER): Admitting: Nurse Practitioner

## 2024-04-25 ENCOUNTER — Encounter: Payer: Self-pay | Admitting: Nurse Practitioner

## 2024-04-25 ENCOUNTER — Encounter: Payer: Self-pay | Admitting: Oncology

## 2024-04-25 VITALS — BP 102/80 | HR 62 | Temp 98.2°F | Ht 67.0 in | Wt 162.8 lb

## 2024-04-25 DIAGNOSIS — F39 Unspecified mood [affective] disorder: Secondary | ICD-10-CM

## 2024-04-25 DIAGNOSIS — Z13220 Encounter for screening for lipoid disorders: Secondary | ICD-10-CM | POA: Diagnosis not present

## 2024-04-25 DIAGNOSIS — Z0000 Encounter for general adult medical examination without abnormal findings: Secondary | ICD-10-CM | POA: Diagnosis not present

## 2024-04-25 DIAGNOSIS — D508 Other iron deficiency anemias: Secondary | ICD-10-CM | POA: Diagnosis not present

## 2024-04-25 DIAGNOSIS — F411 Generalized anxiety disorder: Secondary | ICD-10-CM

## 2024-04-25 DIAGNOSIS — Z9884 Bariatric surgery status: Secondary | ICD-10-CM | POA: Diagnosis not present

## 2024-04-25 DIAGNOSIS — F330 Major depressive disorder, recurrent, mild: Secondary | ICD-10-CM | POA: Diagnosis not present

## 2024-04-25 MED ORDER — DULOXETINE HCL 60 MG PO CPEP: ORAL_CAPSULE | ORAL | 3 refills | Status: AC

## 2024-04-25 NOTE — Assessment & Plan Note (Signed)
 Maintain duloxetine  60 mg daily.  Stable.  Patient denies HI/SI/AVH.

## 2024-04-25 NOTE — Assessment & Plan Note (Signed)
 History of the same pending CBC and iron  cascade

## 2024-04-25 NOTE — Assessment & Plan Note (Signed)
 Patient's weight is within normal limits.  Pending B12, iron  studies, vitamin D 

## 2024-04-25 NOTE — Assessment & Plan Note (Signed)
 Discussed age-appropriate immunizations.  Exams.  Did review patient's personal, surgical, social, family history.  Patient up-to-date with all age-appropriate vaccinations she would like.  She declined flu vaccine we briefly discussed HPV vaccine in office.  Patient is too young for CRC screening.  Patient is up-to-date on breast cancer screening.  Patient is to follow-up with her GYN in regards to cervical cancer screening.  Patient was given information at discharge about preventative healthcare maintenance with anticipatory guidance

## 2024-04-25 NOTE — Patient Instructions (Signed)
 Nice to see you today I will be in touch with the labs once I have them Follow up with me in 1 year, sooner if you need me

## 2024-04-25 NOTE — Assessment & Plan Note (Signed)
 Currently maintained on duloxetine  60 mg daily.  Patient tolerates medication well and feels like it is helpful.  Patient denies HI/SI/AVH.

## 2024-04-25 NOTE — Progress Notes (Signed)
 "  Established Patient Office Visit  Subjective   Patient ID: Mary Fox, female    DOB: [birth year]  Age: 39 y.o. MRN: [id number]  Chief Complaint  Patient presents with   Annual Exam    HPI  Mood: Patient currently maintained on duloxetine  60 mg daily. Feels good   for complete physical and follow up of chronic conditions.  Immunizations: -Tetanus: Completed in 2024 -Influenza:refused  -Shingles: Too young -Pneumonia: Too young -HPV:  Diet: Fair diet. She eating 3 meals a day and some snakcs. Diet coke and water Exercise: No regular exercise. Some walking 3 times a week for 20 mins  Eye exam: Completes annually. Reading glasses   Dental exam: Completes semi-annually    Colonoscopy: Too young, currently average risk Lung Cancer Screening: N/A  Pap smear: Due?  Followed by VALJEAN Hila Copland  Mammogram: 01/03/2024, repeat 1 year  DEXA: Too young  Sleep: going to bed aroudn 1030-11 and get up around 630. Feels rested. Does not snore      Review of Systems  Constitutional:  Negative for chills and fever.  Respiratory:  Negative for shortness of breath.   Cardiovascular:  Negative for chest pain and leg swelling.  Gastrointestinal:  Negative for abdominal pain, blood in stool, constipation, diarrhea, nausea and vomiting.       BM every other day   Genitourinary:  Negative for dysuria and hematuria.  Neurological:  Negative for dizziness, tingling and headaches.  Psychiatric/Behavioral:  Negative for hallucinations and suicidal ideas.       Objective:     BP 102/80   Pulse 62   Temp 98.2 F (36.8 C) (Oral)   Ht 5' 7 (1.702 m)   Wt 162 lb 12.8 oz (73.8 kg)   SpO2 95%   BMI 25.50 kg/m  BP Readings from Last 3 Encounters:  04/25/24 102/80  02/05/24 106/62  01/15/24 107/80   Wt Readings from Last 3 Encounters:  04/25/24 162 lb 12.8 oz (73.8 kg)  02/05/24 167 lb (75.8 kg)  01/15/24 157 lb 14.4 oz (71.6 kg)   SpO2 Readings from Last 3 Encounters:   04/25/24 95%  02/05/24 98%  01/15/24 98%      Physical Exam Vitals and nursing note reviewed.  Constitutional:      Appearance: Normal appearance.  HENT:     Right Ear: Tympanic membrane, ear canal and external ear normal.     Left Ear: Tympanic membrane, ear canal and external ear normal.     Mouth/Throat:     Mouth: Mucous membranes are moist.     Pharynx: Oropharynx is clear.  Eyes:     Extraocular Movements: Extraocular movements intact.     Pupils: Pupils are equal, round, and reactive to light.  Cardiovascular:     Rate and Rhythm: Normal rate and regular rhythm.     Pulses: Normal pulses.     Heart sounds: Normal heart sounds.  Pulmonary:     Effort: Pulmonary effort is normal.     Breath sounds: Normal breath sounds.  Abdominal:     General: Bowel sounds are normal. There is no distension.     Palpations: There is no mass.     Tenderness: There is no abdominal tenderness.     Hernia: No hernia is present.  Musculoskeletal:     Right lower leg: No edema.     Left lower leg: No edema.  Lymphadenopathy:     Cervical: No cervical adenopathy.  Skin:    General:  Skin is warm.  Neurological:     General: No focal deficit present.     Mental Status: She is alert.     Deep Tendon Reflexes:     Reflex Scores:      Bicep reflexes are 2+ on the right side and 2+ on the left side.      Patellar reflexes are 2+ on the right side and 2+ on the left side.    Comments: Bilateral upper and lower extremity strength 5/5  Psychiatric:        Mood and Affect: Mood normal.        Behavior: Behavior normal.        Thought Content: Thought content normal.        Judgment: Judgment normal.      No results found for any visits on 04/25/24.    The 10-year ASCVD risk score (Arnett DK, et al., 2019) is: 0.1%    Assessment & Plan:   Problem List Items Addressed This Visit       Other   IDA (iron  deficiency anemia) (Chronic)   History of the same pending CBC and iron   cascade      Relevant Orders   CBC with Differential/Platelet   Iron , TIBC and Ferritin Panel   History of gastric bypass (Chronic)   Patient's weight is within normal limits.  Pending B12, iron  studies, vitamin D       Relevant Orders   CBC with Differential/Platelet   Vitamin B12   Iron , TIBC and Ferritin Panel   VITAMIN D  25 Hydroxy (Vit-D Deficiency, Fractures)   GAD (generalized anxiety disorder)   Maintain duloxetine  60 mg daily.  Stable.  Patient denies HI/SI/AVH.      Relevant Medications   DULoxetine  (CYMBALTA ) 60 MG capsule   MDD (major depressive disorder), recurrent episode, mild   Currently maintained on duloxetine  60 mg daily.  Patient tolerates medication well and feels like it is helpful.  Patient denies HI/SI/AVH.      Relevant Medications   DULoxetine  (CYMBALTA ) 60 MG capsule   Preventative health care - Primary   Discussed age-appropriate immunizations.  Exams.  Did review patient's personal, surgical, social, family history.  Patient up-to-date with all age-appropriate vaccinations she would like.  She declined flu vaccine we briefly discussed HPV vaccine in office.  Patient is too young for CRC screening.  Patient is up-to-date on breast cancer screening.  Patient is to follow-up with her GYN in regards to cervical cancer screening.  Patient was given information at discharge about preventative healthcare maintenance with anticipatory guidance      Relevant Orders   Comprehensive metabolic panel with GFR   CBC with Differential/Platelet   TSH   Other Visit Diagnoses       Screening for lipid disorders       Relevant Orders   Lipid panel     Episodic mood disorder       Relevant Medications   DULoxetine  (CYMBALTA ) 60 MG capsule       Return in about 1 year (around 04/25/2025) for CPE and Labs.    Adina Crandall, NP  "

## 2024-04-26 LAB — CBC WITH DIFFERENTIAL/PLATELET
Basophils Absolute: 0 x10E3/uL (ref 0.0–0.2)
Basos: 1 %
EOS (ABSOLUTE): 0.1 x10E3/uL (ref 0.0–0.4)
Eos: 1 %
Hematocrit: 38.4 % (ref 34.0–46.6)
Hemoglobin: 12.6 g/dL (ref 11.1–15.9)
Immature Grans (Abs): 0 x10E3/uL (ref 0.0–0.1)
Immature Granulocytes: 0 %
Lymphocytes Absolute: 1.6 x10E3/uL (ref 0.7–3.1)
Lymphs: 32 %
MCH: 28.5 pg (ref 26.6–33.0)
MCHC: 32.8 g/dL (ref 31.5–35.7)
MCV: 87 fL (ref 79–97)
Monocytes Absolute: 0.3 x10E3/uL (ref 0.1–0.9)
Monocytes: 6 %
Neutrophils Absolute: 3 x10E3/uL (ref 1.4–7.0)
Neutrophils: 60 %
Platelets: 270 x10E3/uL (ref 150–450)
RBC: 4.42 x10E6/uL (ref 3.77–5.28)
RDW: 12.1 % (ref 11.7–15.4)
WBC: 5 x10E3/uL (ref 3.4–10.8)

## 2024-04-26 LAB — COMPREHENSIVE METABOLIC PANEL WITH GFR
ALT: 23 IU/L (ref 0–32)
AST: 19 IU/L (ref 0–40)
Albumin: 4 g/dL (ref 3.9–4.9)
Alkaline Phosphatase: 60 IU/L (ref 41–116)
BUN/Creatinine Ratio: 16 (ref 9–23)
BUN: 9 mg/dL (ref 6–24)
Bilirubin Total: 0.2 mg/dL (ref 0.0–1.2)
CO2: 21 mmol/L (ref 20–29)
Calcium: 8.8 mg/dL (ref 8.7–10.2)
Chloride: 106 mmol/L (ref 96–106)
Creatinine, Ser: 0.58 mg/dL (ref 0.57–1.00)
Globulin, Total: 1.8 g/dL (ref 1.5–4.5)
Glucose: 81 mg/dL (ref 70–99)
Potassium: 4 mmol/L (ref 3.5–5.2)
Sodium: 140 mmol/L (ref 134–144)
Total Protein: 5.8 g/dL — ABNORMAL LOW (ref 6.0–8.5)
eGFR: 117 mL/min/1.73

## 2024-04-26 LAB — LIPID PANEL
Chol/HDL Ratio: 2 ratio (ref 0.0–4.4)
Cholesterol, Total: 149 mg/dL (ref 100–199)
HDL: 74 mg/dL
LDL Chol Calc (NIH): 63 mg/dL (ref 0–99)
Triglycerides: 56 mg/dL (ref 0–149)
VLDL Cholesterol Cal: 12 mg/dL (ref 5–40)

## 2024-04-26 LAB — VITAMIN D 25 HYDROXY (VIT D DEFICIENCY, FRACTURES): Vit D, 25-Hydroxy: 52.7 ng/mL (ref 30.0–100.0)

## 2024-04-26 LAB — IRON,TIBC AND FERRITIN PANEL
Ferritin: 18 ng/mL (ref 15–150)
Iron Saturation: 25 % (ref 15–55)
Iron: 108 ug/dL (ref 27–159)
Total Iron Binding Capacity: 435 ug/dL (ref 250–450)
UIBC: 327 ug/dL (ref 131–425)

## 2024-04-26 LAB — VITAMIN B12: Vitamin B-12: 398 pg/mL (ref 232–1245)

## 2024-04-26 LAB — TSH: TSH: 1.74 u[IU]/mL (ref 0.450–4.500)

## 2024-04-29 ENCOUNTER — Ambulatory Visit: Payer: Self-pay | Admitting: Nurse Practitioner

## 2024-07-15 ENCOUNTER — Ambulatory Visit

## 2024-07-15 ENCOUNTER — Ambulatory Visit: Admitting: Oncology
